# Patient Record
Sex: Female | Born: 1985
Health system: Southern US, Community
[De-identification: ages and names within clinical notes are randomized; demographics above are authoritative.]

## PROBLEM LIST (undated history)

## (undated) DIAGNOSIS — R600 Localized edema: Secondary | ICD-10-CM

## (undated) DIAGNOSIS — N649 Disorder of breast, unspecified: Secondary | ICD-10-CM

## (undated) DIAGNOSIS — E119 Type 2 diabetes mellitus without complications: Secondary | ICD-10-CM

## (undated) DIAGNOSIS — Z86718 Personal history of other venous thrombosis and embolism: Secondary | ICD-10-CM

## (undated) DIAGNOSIS — E669 Obesity, unspecified: Secondary | ICD-10-CM

## (undated) DIAGNOSIS — E282 Polycystic ovarian syndrome: Secondary | ICD-10-CM

## (undated) DIAGNOSIS — J45909 Unspecified asthma, uncomplicated: Secondary | ICD-10-CM

## (undated) DIAGNOSIS — IMO0002 Reserved for concepts with insufficient information to code with codable children: Secondary | ICD-10-CM

## (undated) DIAGNOSIS — N6019 Diffuse cystic mastopathy of unspecified breast: Secondary | ICD-10-CM

## (undated) DIAGNOSIS — Z5189 Encounter for other specified aftercare: Secondary | ICD-10-CM

## (undated) DIAGNOSIS — F909 Attention-deficit hyperactivity disorder, unspecified type: Secondary | ICD-10-CM

## (undated) DIAGNOSIS — F32A Depression, unspecified: Secondary | ICD-10-CM

## (undated) DIAGNOSIS — G4730 Sleep apnea, unspecified: Secondary | ICD-10-CM

## (undated) DIAGNOSIS — D649 Anemia, unspecified: Secondary | ICD-10-CM

## (undated) HISTORY — PX: BREAST SURGERY: SHX581

## (undated) HISTORY — DX: Disorder of breast, unspecified: N64.9

## (undated) HISTORY — DX: Localized edema: R60.0

## (undated) HISTORY — DX: Anemia, unspecified: D64.9

## (undated) HISTORY — DX: Polycystic ovarian syndrome: E28.2

## (undated) HISTORY — PX: BUNIONECTOMY: SHX129

## (undated) HISTORY — DX: Diffuse cystic mastopathy of unspecified breast: N60.19

## (undated) HISTORY — DX: Encounter for other specified aftercare: Z51.89

## (undated) HISTORY — PX: BREAST BIOPSY: SHX20

## (undated) HISTORY — DX: Sleep apnea, unspecified: G47.30

## (undated) HISTORY — PX: COLPOSCOPY: SHX161

## (undated) HISTORY — DX: Unspecified asthma, uncomplicated: J45.909

## (undated) HISTORY — DX: Reserved for concepts with insufficient information to code with codable children: IMO0002

---
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
## 2007-04-24 ENCOUNTER — Telehealth: Payer: Self-pay | Admitting: *Deleted

## 2007-12-11 ENCOUNTER — Emergency Department (HOSPITAL_COMMUNITY): Admission: EM | Admit: 2007-12-11 | Discharge: 2007-12-11 | Payer: Self-pay | Admitting: Emergency Medicine

## 2008-04-08 ENCOUNTER — Ambulatory Visit: Payer: Self-pay | Admitting: Family Medicine

## 2008-04-08 DIAGNOSIS — J029 Acute pharyngitis, unspecified: Secondary | ICD-10-CM

## 2008-04-08 DIAGNOSIS — R1084 Generalized abdominal pain: Secondary | ICD-10-CM | POA: Insufficient documentation

## 2008-04-08 LAB — CONVERTED CEMR LAB: Rapid Strep: NEGATIVE

## 2008-07-09 ENCOUNTER — Telehealth: Payer: Self-pay | Admitting: *Deleted

## 2009-01-06 ENCOUNTER — Emergency Department (HOSPITAL_COMMUNITY): Admission: EM | Admit: 2009-01-06 | Discharge: 2009-01-06 | Payer: Self-pay | Admitting: Emergency Medicine

## 2009-05-24 ENCOUNTER — Emergency Department (HOSPITAL_COMMUNITY): Admission: EM | Admit: 2009-05-24 | Discharge: 2009-05-24 | Payer: Self-pay | Admitting: Emergency Medicine

## 2009-10-13 ENCOUNTER — Emergency Department (HOSPITAL_COMMUNITY): Admission: EM | Admit: 2009-10-13 | Discharge: 2009-10-14 | Payer: Self-pay | Admitting: Emergency Medicine

## 2010-01-04 ENCOUNTER — Emergency Department (HOSPITAL_BASED_OUTPATIENT_CLINIC_OR_DEPARTMENT_OTHER): Admission: EM | Admit: 2010-01-04 | Discharge: 2010-01-04 | Payer: Self-pay | Admitting: Emergency Medicine

## 2010-01-04 ENCOUNTER — Ambulatory Visit: Payer: Self-pay | Admitting: Diagnostic Radiology

## 2010-08-10 ENCOUNTER — Encounter: Payer: Self-pay | Admitting: *Deleted

## 2010-09-06 LAB — URINALYSIS, ROUTINE W REFLEX MICROSCOPIC
Glucose, UA: NEGATIVE mg/dL
Protein, ur: NEGATIVE mg/dL
Specific Gravity, Urine: 1.024 (ref 1.005–1.030)

## 2010-09-06 LAB — POCT PREGNANCY, URINE: Preg Test, Ur: NEGATIVE

## 2010-09-06 LAB — WET PREP, GENITAL: Yeast Wet Prep HPF POC: NONE SEEN

## 2010-09-06 LAB — URINE MICROSCOPIC-ADD ON

## 2010-09-06 LAB — GC/CHLAMYDIA PROBE AMP, GENITAL
Chlamydia, DNA Probe: NEGATIVE
GC Probe Amp, Genital: NEGATIVE

## 2010-09-25 LAB — POCT PREGNANCY, URINE: Preg Test, Ur: NEGATIVE

## 2011-01-12 ENCOUNTER — Emergency Department (HOSPITAL_COMMUNITY)
Admission: EM | Admit: 2011-01-12 | Discharge: 2011-01-12 | Disposition: A | Discharge status: Home / Self Care | Payer: No Typology Code available for payment source | Attending: Emergency Medicine | Admitting: Emergency Medicine

## 2011-01-12 ENCOUNTER — Emergency Department (HOSPITAL_COMMUNITY): Payer: No Typology Code available for payment source

## 2011-01-12 DIAGNOSIS — R1031 Right lower quadrant pain: Secondary | ICD-10-CM | POA: Insufficient documentation

## 2011-01-12 DIAGNOSIS — N390 Urinary tract infection, site not specified: Secondary | ICD-10-CM | POA: Insufficient documentation

## 2011-01-12 LAB — COMPREHENSIVE METABOLIC PANEL
AST: 18 U/L (ref 0–37)
Alkaline Phosphatase: 51 U/L (ref 39–117)
BUN: 7 mg/dL (ref 6–23)
CO2: 27 mEq/L (ref 19–32)
Chloride: 102 mEq/L (ref 96–112)
GFR calc Af Amer: >60 mL/min (ref >60)
GFR calc non Af Amer: >60 mL/min (ref >60)
Sodium: 138 mEq/L (ref 135–145)
Total Bilirubin: 0.4 mg/dL (ref 0.3–1.2)
Total Protein: 8.1 g/dL (ref 6.0–8.3)

## 2011-01-12 LAB — URINALYSIS, ROUTINE W REFLEX MICROSCOPIC
Bilirubin Urine: NEGATIVE
Ketones, ur: NEGATIVE mg/dL
Nitrite: NEGATIVE
Urobilinogen, UA: 0.2 mg/dL (ref 0.0–1.0)
pH: 5.5 (ref 5.0–8.0)

## 2011-01-12 LAB — DIFFERENTIAL
Basophils Relative: 0 % (ref 0–1)
Lymphs Abs: 2.4 10*3/uL (ref 0.7–4.0)
Monocytes Absolute: 0.9 10*3/uL (ref 0.1–1.0)
Neutro Abs: 6.4 10*3/uL (ref 1.7–7.7)
Neutrophils Relative %: 65 % (ref 43–77)

## 2011-01-12 LAB — CBC
HCT: 34.8 % — ABNORMAL LOW (ref 36.0–46.0)
MCH: 31.1 pg (ref 26.0–34.0)
MCHC: 33 g/dL (ref 30.0–36.0)
MCV: 94.1 fL (ref 78.0–100.0)
RDW: 12.7 % (ref 11.5–15.5)

## 2011-01-12 LAB — POCT PREGNANCY, URINE: Preg Test, Ur: NEGATIVE

## 2011-01-12 MED ORDER — IOHEXOL 300 MG/ML  SOLN
100.0000 mL | Freq: Once | INTRAMUSCULAR | Status: AC | PRN
  Administered 2011-01-12: 100 mL via INTRAVENOUS

## 2011-01-12 NOTE — ED Notes (Signed)
°

## 2011-01-12 NOTE — ED Provider Notes (Signed)
°

## 2011-01-12 NOTE — Procedures (Signed)
°

## 2011-01-13 NOTE — Procedures (Signed)
°

## 2011-01-17 NOTE — Miscellaneous (Signed)
°

## 2011-01-17 NOTE — Consent Form (Signed)
°

## 2011-01-17 NOTE — ED Notes (Signed)
°

## 2011-03-16 LAB — URINE MICROSCOPIC-ADD ON

## 2011-03-16 LAB — URINALYSIS, ROUTINE W REFLEX MICROSCOPIC
Bilirubin Urine: NEGATIVE
Glucose, UA: NEGATIVE
Nitrite: NEGATIVE

## 2011-03-16 LAB — POCT PREGNANCY, URINE
Operator id: 27065
Preg Test, Ur: NEGATIVE

## 2011-04-12 ENCOUNTER — Ambulatory Visit: Payer: No Typology Code available for payment source | Admitting: Family Medicine

## 2011-04-17 ENCOUNTER — Ambulatory Visit: Payer: No Typology Code available for payment source

## 2011-06-01 NOTE — ED Provider Notes (Signed)
°

## 2011-06-01 NOTE — ED Notes (Signed)
°

## 2011-07-01 NOTE — ED Notes (Signed)
°

## 2011-07-01 NOTE — ED Provider Notes (Signed)
°

## 2011-07-31 NOTE — ED Provider Notes (Addendum)
°

## 2011-08-10 NOTE — ED Notes (Signed)
°

## 2011-11-01 ENCOUNTER — Emergency Department (HOSPITAL_COMMUNITY)
Admission: EM | Admit: 2011-11-01 | Discharge: 2011-11-01 | Disposition: A | Discharge status: Home / Self Care | Payer: Self-pay | Attending: Emergency Medicine | Admitting: Emergency Medicine

## 2011-11-01 ENCOUNTER — Other Ambulatory Visit: Payer: Self-pay | Admitting: Family Medicine

## 2011-11-01 ENCOUNTER — Encounter (HOSPITAL_COMMUNITY): Payer: Self-pay

## 2011-11-01 DIAGNOSIS — N63 Unspecified lump in unspecified breast: Secondary | ICD-10-CM | POA: Insufficient documentation

## 2011-11-01 DIAGNOSIS — R000 Tachycardia, unspecified: Secondary | ICD-10-CM | POA: Insufficient documentation

## 2011-11-01 DIAGNOSIS — T50905A Adverse effect of unspecified drugs, medicaments and biological substances, initial encounter: Secondary | ICD-10-CM | POA: Insufficient documentation

## 2011-11-01 DIAGNOSIS — N6315 Unspecified lump in the right breast, overlapping quadrants: Secondary | ICD-10-CM

## 2011-11-01 NOTE — ED Notes (Signed)
Patient reports that she feels odd. Feels as if her heart is racing and feels restless. Drank coffee this am and also took phentermine for first time

## 2011-11-01 NOTE — Discharge Instructions (Signed)
As we discussed, you should stop taking the phentermine immediately.  Regarding your breast lump, the breast center sees patients in [place] with these types of problems. You can call them to find out what tests your regular doctor may need to order. They may request a referral from your regular doctor prior to your visit. If you develop fever, chills, redness, or drainage from the area you should be seen again immediately for treatment.      Breast Problems and Self-Exam Completing monthly breast exams may pick up problems early and save lives. There can be numerous causes of swelling, tenderness or lumps in the breasts. Some of these causes are:   Fibrocystic breast syndrome (noncancerous lumps). This is the most common cause of lumps in the breast.   Fibroadenoma breast tumors of unknown cause. These are noncancerous (benign) lumps.   Benign fatty tumors (lipomas).   Cancer of the breast.  By doing monthly breast exams, you get to know how your breasts feel and how they can change from month to month. This allows you to notice changes early. It can also offer you some reassurance that your breast health is good.  BREAST SELF-EXAM There are a few points to follow when doing a thorough breast exam. The best time to examine your breasts is 5 to 7 days after your menstrual period is over. During menstruation, the breasts are lumpier, and it may be more difficult to pick up changes. If you do not menstruate, have reached menopause, or had a hysterectomy (uterus removal), examine your breasts the first day of every month. After 3 to 4 months, you will become more familiar with the variations of your breasts and more comfortable with the exam.  Perform your breast exam monthly. Keep a written record with breast changes or normal findings for each breast. This makes it easier to be sure of changes, so you do not need to depend only on memory for size, tenderness, or location. Try to do the exam  at the same time each month, and write down where you are in your menstrual cycle, if you are still menstruating.   Look at your breasts. Stand in front of a mirror with your hands clasped behind your head. Tighten your chest muscles and look for asymmetry. This means a difference in shape or contour from one breast to the other, such as puckers, dips or bumps. Also, look for skin changes.   Lean forward with your hands on your hips. Again, look for symmetry and skin changes.   While showering, soap the breasts. Then, carefully feel the breasts with your fingertips, while holding the other arm (on the side of the breast you are examining) over your head. Do this with each breast, carefully feeling for lumps or changes. Typically, a circular motion with moderate fingertip pressure should be used.   Repeat this exam while lying on your back. Put your arm behind your head and a pillow under your shoulders. Again, use your fingertips to examine both breasts, feeling for lumps and thickening. Begin at the top of your breast, and go clockwise around the whole breast.   At the end of your exam, gently squeeze each nipple to see if there is any drainage of fluids. Look for nipple changes, dimpling, or redness.   Lastly, examine the upper chest and collarbone (clavicle) areas, and in your armpits.  It is not necessary to be alarmed if you find a breast lump. Most of them are not cancerous.  However, it is necessary to see your caregiver if a lump is found, in order to have it looked at. Document Released: 06/05/2005 Document Revised: 05/25/2011 Document Reviewed: 09/22/2008 [facility] Patient Information 2012 [place], [place].

## 2011-11-01 NOTE — ED Provider Notes (Signed)
History     CSN: [id number]  Arrival date & time 11/01/11  0865   First MD Initiated Contact with Patient 11/01/11 1010      Chief Complaint  Patient presents with  . Tachycardia    (Consider location/radiation/quality/duration/timing/severity/associated sxs/prior treatment) The history is provided by the patient.  Healthy 26 y/o F presents to ED with c/c of tachycardia and restlessness. Symptoms began approx 30 minutes after taking her first dose of a prescription diet pill, phentermine, and drinking a cup of coffee. There is no assoc diaphoresis, CP or SOB. Symptoms have improved since onset. No aggravating factors, rest relieves the symptoms. No prior treatment. No personal hx CAD, HTN, HLD, or DM. No exogenous estrogen use, recent prolonged trip, or hx DVT/PE. No known family hx early CAD.  History reviewed. No pertinent past medical history.  History reviewed. No pertinent past surgical history.  No family history on file.  History  Substance Use Topics  . Smoking status: Former Games developer  . Smokeless tobacco: Not on file  . Alcohol Use:      Review of Systems 10 systems reviewed and are negative for acute change except as noted in the HPI.  Allergies  Review of patient's allergies indicates no known allergies.  Home Medications   Current Outpatient Rx  Name Route Sig Dispense Refill  . PHENTERMINE HCL 37.5 MG PO CAPS Oral Take 37.5 mg by mouth every morning.      BP 124/88  Pulse 86  Temp 98.5 F (36.9 C)  Resp 18  SpO2 100%  Physical Exam  Nursing note reviewed. Constitutional: She is oriented to person, place, and time. She appears well-developed and well-nourished. No distress.       Vital signs are reviewed and are normal. Specifically, there is no tachycardia.  HENT:  Head: Normocephalic and atraumatic.  Eyes: Conjunctivae are normal. Pupils are equal, round, and reactive to light.  Neck: Neck supple.  Cardiovascular: Normal rate, regular rhythm  and normal heart sounds.   No murmur heard.      Bilateral radial and DP pulses are 2+   Pulmonary/Chest: Effort normal and breath sounds normal. No respiratory distress. She has no wheezes. She has no rales. She exhibits no tenderness. Right breast exhibits mass.    Abdominal: Soft. Bowel sounds are normal. Distention: obese. There is no tenderness.  Musculoskeletal: She exhibits no edema and no tenderness.  Neurological: She is alert and oriented to person, place, and time.       Gait normal. Speech clear  Skin: Skin is warm and dry. No rash noted.  Psychiatric:       Anxious-appearing    ED Course  Procedures (including critical care time)  Labs Reviewed - No data to display No results found.   Date: 11/01/2011  Rate: 74  Rhythm: normal sinus rhythm  QRS Axis: normal  Intervals: normal  ST/T Wave abnormalities: normal  Conduction Disutrbances: none  Old EKG Reviewed: No prior available     1. Breast lump on right side at 3 o'clock position   2. Medication reaction       MDM  Resolved tachycardia and restlessness after consuming rx diet pill- these are common side effects for this medication. Pt has a normal EKG and symptoms are improving. No risk factors for ACS, PERC negative. Discussed medication cessation with PMD f/u or re-eval if symptoms return.  Breast mass seen on exam, pt reports it has been gradually increasing in size over the last several  months. No nipple discharge. No skin changes to suggest abscess. Discussed appropriate breast center follow-up, pt voices understanding.        Shaaron Adler, PA-C 11/03/11 1201

## 2011-11-07 NOTE — ED Provider Notes (Signed)
Medical screening examination/treatment/procedure(s) were performed by non-physician practitioner and as supervising physician I was immediately available for consultation/collaboration. Devoria Albe, MD, Armando Gang   Ward Givens, MD 11/07/11 [phone]

## 2011-11-16 ENCOUNTER — Other Ambulatory Visit: Payer: Self-pay | Admitting: Obstetrics and Gynecology

## 2011-11-16 DIAGNOSIS — N63 Unspecified lump in unspecified breast: Secondary | ICD-10-CM

## 2011-11-17 ENCOUNTER — Encounter: Payer: Self-pay | Admitting: *Deleted

## 2011-11-17 ENCOUNTER — Ambulatory Visit (INDEPENDENT_AMBULATORY_CARE_PROVIDER_SITE_OTHER): Payer: Self-pay | Admitting: *Deleted

## 2011-11-17 VITALS — BP 149/105 | HR 79 | Temp 97.8°F | Ht 68.0 in | Wt 303.4 lb

## 2011-11-17 DIAGNOSIS — N6312 Unspecified lump in the right breast, upper inner quadrant: Secondary | ICD-10-CM

## 2011-11-17 DIAGNOSIS — N63 Unspecified lump in unspecified breast: Secondary | ICD-10-CM

## 2011-11-17 DIAGNOSIS — N898 Other specified noninflammatory disorders of vagina: Secondary | ICD-10-CM

## 2011-11-17 DIAGNOSIS — Z01419 Encounter for gynecological examination (general) (routine) without abnormal findings: Secondary | ICD-10-CM

## 2011-11-17 DIAGNOSIS — N6311 Unspecified lump in the right breast, upper outer quadrant: Secondary | ICD-10-CM

## 2011-11-17 NOTE — Patient Instructions (Signed)
Taught patient how to perform BSE.  Let her know her next Pap smear will be due based on today's result since patient had an abnormal Pap smear in 2009. Patient referred to the [facility] for right breast ultrasound. Appointment scheduled for Wednesday, November 22, 2011 at 1110. Patient aware of appointment and will be there. Let patient know will follow up with her within the next couple weeks with results. Patient verbalized understanding.

## 2011-11-17 NOTE — Progress Notes (Signed)
Complaints of right breast lump since September 2012 that has increased in size. Patient states pain at lump that is constant rating at a 10 out of 10 when awakes in the morning and at a 6 out of 10 during the remainder of the day. Patient states the pain has increased over the past month.  Pap Smear:    Completed Pap smear today. Last Pap smear was at least 3 years ago per patient. Per patient had an abnormal Pap smear in 2009 that required a colposcopy for follow up. No Pap smear results in EPIC.  Physical exam: Breasts Breasts symmetrical. No skin abnormalities bilateral breasts. No nipple retraction bilateral breasts. No nipple discharge bilateral breasts. No lymphadenopathy. No lumps palpated left breast. Palpated two lumps in the right breast at 11 o'clock on the areola and at 1 o'clock 2 cm from the areola. Complaints of pain on palpation of lumps. Patient referred to the [facility] for right breast ultrasound. Appointment scheduled for Wednesday, November 22, 2011 at 1110.       Pelvic/Bimanual   Ext Genitalia No lesions, no swelling and no discharge observed on external genitalia.         Vagina Vagina pink and normal texture. No lesions and white creamy discharge observed in vagina. Completed wet prep.          Cervix Cervix is present. Cervix pink and of normal texture. White creamy discharge observed on cervix.         Uterus Uterus is present and palpable. Uterus in normal position and normal size.       Adnexae Bilateral ovaries present and unable to palpate due to patient being morbid obese. No tenderness on palpation.        Rectovaginal No rectal exam completed today since patient had no rectal complaints. No skin abnormalties observed on rectal area.

## 2011-11-20 LAB — WET PREP, GENITAL: Trich, Wet Prep: NONE SEEN

## 2011-11-22 ENCOUNTER — Ambulatory Visit
Admission: RE | Admit: 2011-11-22 | Discharge: 2011-11-22 | Disposition: A | Discharge status: Home / Self Care | Payer: No Typology Code available for payment source | Source: Ambulatory Visit | Attending: Obstetrics and Gynecology | Admitting: Obstetrics and Gynecology

## 2011-11-22 ENCOUNTER — Other Ambulatory Visit: Payer: Self-pay | Admitting: Obstetrics and Gynecology

## 2011-11-22 DIAGNOSIS — N63 Unspecified lump in unspecified breast: Secondary | ICD-10-CM

## 2011-11-23 MED ORDER — METRONIDAZOLE 500 MG PO TABS: 500.0000 mg | ORAL_TABLET | Freq: Two times a day (BID) | ORAL | Status: AC

## 2011-11-23 NOTE — Progress Notes (Signed)
Addended by: Catalina Antigua on: 11/23/2011 02:05 PM   Modules accepted: Orders

## 2011-11-27 ENCOUNTER — Ambulatory Visit: Payer: No Typology Code available for payment source | Admitting: Family Medicine

## 2011-11-28 ENCOUNTER — Telehealth: Payer: Self-pay | Admitting: *Deleted

## 2011-11-28 NOTE — Telephone Encounter (Signed)
Telephoned patient and advised of positive results for trichomonas. Patient to pick Rx up at pharmacy and advised partner would also need to be treated. Patient voiced understanding.

## 2011-12-05 ENCOUNTER — Ambulatory Visit
Admission: RE | Admit: 2011-12-05 | Discharge: 2011-12-05 | Disposition: A | Discharge status: Home / Self Care | Payer: No Typology Code available for payment source | Source: Ambulatory Visit | Attending: Obstetrics and Gynecology | Admitting: Obstetrics and Gynecology

## 2011-12-05 DIAGNOSIS — N63 Unspecified lump in unspecified breast: Secondary | ICD-10-CM

## 2011-12-12 ENCOUNTER — Ambulatory Visit (INDEPENDENT_AMBULATORY_CARE_PROVIDER_SITE_OTHER): Payer: Self-pay | Admitting: Family Medicine

## 2011-12-12 ENCOUNTER — Encounter: Payer: Self-pay | Admitting: Family Medicine

## 2011-12-12 VITALS — BP 119/77 | HR 69 | Temp 98.2°F | Ht 68.0 in | Wt 304.0 lb

## 2011-12-12 DIAGNOSIS — N6019 Diffuse cystic mastopathy of unspecified breast: Secondary | ICD-10-CM

## 2011-12-12 DIAGNOSIS — Z9189 Other specified personal risk factors, not elsewhere classified: Secondary | ICD-10-CM

## 2011-12-12 DIAGNOSIS — Z202 Contact with and (suspected) exposure to infections with a predominantly sexual mode of transmission: Secondary | ICD-10-CM | POA: Insufficient documentation

## 2011-12-12 DIAGNOSIS — E669 Obesity, unspecified: Secondary | ICD-10-CM

## 2011-12-12 LAB — COMPREHENSIVE METABOLIC PANEL
AST: 16 U/L (ref 0–37)
Albumin: 3.9 g/dL (ref 3.5–5.2)
Alkaline Phosphatase: 42 U/L (ref 39–117)
Chloride: 108 mEq/L (ref 96–112)
Glucose, Bld: 93 mg/dL (ref 70–99)
Potassium: 4.3 mEq/L (ref 3.5–5.3)
Sodium: 140 mEq/L (ref 135–145)
Total Protein: 6.7 g/dL (ref 6.0–8.3)
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
## 2011-12-12 NOTE — Assessment & Plan Note (Signed)
A: declined. With weight gain. Poor diet.  P: Check lipids and blood sugar and TSH.  referral to nutritionist requested and provided.

## 2011-12-12 NOTE — Assessment & Plan Note (Signed)
A: large breast with cystic lesions.  P: recommend patient call to scheduled recommended biopsy instead of waiting for f/u U/S in 3 months. Patient voiced understanding and agreed with plan.

## 2011-12-12 NOTE — Patient Instructions (Addendum)
Chameka,  It was nice to meet your. For weight loss counseling please call Wyona Almas at [phone]. She sets her own schedule.  I will call or send a letter (if all normal) with your blood work results.   Dr. Armen Pickup

## 2011-12-13 ENCOUNTER — Telehealth: Payer: Self-pay | Admitting: Family Medicine

## 2011-12-13 NOTE — Telephone Encounter (Signed)
Called patient informed her that all labs are normal.

## 2011-12-14 ENCOUNTER — Other Ambulatory Visit: Payer: Self-pay | Admitting: Obstetrics and Gynecology

## 2011-12-14 DIAGNOSIS — N63 Unspecified lump in unspecified breast: Secondary | ICD-10-CM

## 2011-12-25 ENCOUNTER — Inpatient Hospital Stay: Admission: RE | Admit: 2011-12-25 | Payer: Self-pay | Source: Ambulatory Visit

## 2011-12-28 ENCOUNTER — Ambulatory Visit: Payer: Self-pay | Admitting: Family Medicine

## 2012-01-03 ENCOUNTER — Telehealth: Payer: Self-pay | Admitting: Family Medicine

## 2012-01-03 NOTE — Telephone Encounter (Signed)
Called patient. She missed nutrition visit because she thought it was this week. She would like to reschedule. Schedule 11:30 AM 01/11/12.   Called patient back, no 11:30 available. I put her in at noon. Left VM for return call.

## 2012-01-08 ENCOUNTER — Ambulatory Visit
Admission: RE | Admit: 2012-01-08 | Discharge: 2012-01-08 | Disposition: A | Discharge status: Home / Self Care | Payer: No Typology Code available for payment source | Source: Ambulatory Visit | Attending: Obstetrics and Gynecology | Admitting: Obstetrics and Gynecology

## 2012-01-08 ENCOUNTER — Other Ambulatory Visit: Payer: Self-pay | Admitting: Obstetrics and Gynecology

## 2012-01-08 DIAGNOSIS — N63 Unspecified lump in unspecified breast: Secondary | ICD-10-CM

## 2012-01-09 NOTE — Procedures (Signed)
°

## 2012-01-11 ENCOUNTER — Ambulatory Visit: Payer: Self-pay | Admitting: Family Medicine

## 2012-03-07 ENCOUNTER — Telehealth (HOSPITAL_COMMUNITY): Payer: Self-pay | Admitting: *Deleted

## 2012-03-07 NOTE — Telephone Encounter (Signed)
Telephoned home # and # does not accept incoming calls.

## 2012-06-19 HISTORY — PX: OTHER SURGICAL HISTORY: SHX169

## 2012-09-17 ENCOUNTER — Emergency Department (HOSPITAL_COMMUNITY)
Admission: EM | Admit: 2012-09-17 | Discharge: 2012-09-17 | Disposition: A | Discharge status: Home / Self Care | Payer: Self-pay | Attending: Emergency Medicine | Admitting: Emergency Medicine

## 2012-09-17 ENCOUNTER — Encounter (HOSPITAL_COMMUNITY): Payer: Self-pay | Admitting: Emergency Medicine

## 2012-09-17 DIAGNOSIS — R220 Localized swelling, mass and lump, head: Secondary | ICD-10-CM | POA: Insufficient documentation

## 2012-09-17 DIAGNOSIS — Z87891 Personal history of nicotine dependence: Secondary | ICD-10-CM | POA: Insufficient documentation

## 2012-09-17 DIAGNOSIS — K006 Disturbances in tooth eruption: Secondary | ICD-10-CM | POA: Insufficient documentation

## 2012-09-17 DIAGNOSIS — K029 Dental caries, unspecified: Secondary | ICD-10-CM | POA: Insufficient documentation

## 2012-09-17 DIAGNOSIS — Z8742 Personal history of other diseases of the female genital tract: Secondary | ICD-10-CM | POA: Insufficient documentation

## 2012-09-17 DIAGNOSIS — K0889 Other specified disorders of teeth and supporting structures: Secondary | ICD-10-CM

## 2012-09-17 DIAGNOSIS — K089 Disorder of teeth and supporting structures, unspecified: Secondary | ICD-10-CM | POA: Insufficient documentation

## 2012-09-17 DIAGNOSIS — R221 Localized swelling, mass and lump, neck: Secondary | ICD-10-CM | POA: Insufficient documentation

## 2012-09-17 MED ORDER — HYDROCODONE-ACETAMINOPHEN 5-325 MG PO TABS: 1.0000 | ORAL_TABLET | ORAL | Status: DC | PRN

## 2012-09-17 MED ORDER — PENICILLIN V POTASSIUM 500 MG PO TABS: 500.0000 mg | ORAL_TABLET | Freq: Four times a day (QID) | ORAL | Status: AC

## 2012-09-17 NOTE — ED Provider Notes (Signed)
History     CSN: [id number]  Arrival date & time 09/17/12  1420   First MD Initiated Contact with Patient 09/17/12 1439      Chief Complaint  Patient presents with   Dental Pain    (Consider location/radiation/quality/duration/timing/severity/associated sxs/prior treatment) HPI Comments: Pt presenting to the ED for dental pain x 2 days.  Pain localized over left lower molars and right upper molars.  Notes that she feels a small amount of swelling over her right upper jaw.  Prior dental abscess requiring surgical excision of tooth.  Does not currently see a dentist.  Pain is exacerbated by chewing.  Tried OTC medications without relief of sx.  Denies fever, chills, trismus.  No difficulty swallowing or breathing.  Patient is a 27 y.o. female presenting with tooth pain. The history is provided by the patient.  Dental Pain   Past Medical History  Diagnosis Date   Abnormal Pap smear     Last pap smear normal    Breast disorder     lump in right breast   Edema of left lower extremity     Since age 44     Past Surgical History  Procedure Laterality Date   Colposcopy  2009/2010    Family History  Problem Relation Age of Onset   Hypertension Mother    Asthma Mother    Asthma Brother    Cancer Maternal Uncle     lung   Cancer Maternal Grandmother     brain   Cancer Maternal Grandfather     pancreatic    History  Substance Use Topics   Smoking status: Former Smoker    Quit date: 12/12/2010   Smokeless tobacco: Never Used   Alcohol Use: Yes     Comment: socially    OB History   Grav Para Term Preterm Abortions TAB SAB Ect Mult Living   1    1  1    0      Review of Systems  HENT: Positive for dental problem.   All other systems reviewed and are negative.    Allergies  Review of patient's allergies indicates no known allergies.  Home Medications   Current Outpatient Rx  Name  Route  Sig  Dispense  Refill   acetaminophen (TYLENOL) 500 MG  tablet   Oral   Take 4,000 mg by mouth every 6 (six) hours as needed for pain.          ibuprofen (ADVIL,MOTRIN) 200 MG tablet   Oral   Take 800 mg by mouth every 6 (six) hours as needed for pain.           BP 110/64   Pulse 68   Temp(Src) 98.3 F (36.8 C) (Oral)   Resp 22   SpO2 98%   LMP 09/12/2012  Physical Exam  Nursing note and vitals reviewed. Constitutional: She is oriented to person, place, and time. She appears well-developed and well-nourished.  HENT:  Head: Normocephalic and atraumatic. No trismus in the jaw.  Mouth/Throat: Uvula is midline, oropharynx is clear and moist and mucous membranes are normal. No oral lesions. Abnormal dentition. Dental caries present. No edematous. No oropharyngeal exudate, posterior oropharyngeal edema, posterior oropharyngeal erythema or tonsillar abscesses.    Eyes: Conjunctivae and EOM are normal. Pupils are equal, round, and reactive to light.  Neck: Normal range of motion.  Cardiovascular: Normal rate, regular rhythm and normal heart sounds.   Pulmonary/Chest: Effort normal and breath sounds normal. She has no wheezes.  Musculoskeletal: Normal range of motion.  Neurological: She is alert and oriented to person, place, and time.  Skin: Skin is warm and dry.  Psychiatric: She has a normal mood and affect.    ED Course  Procedures (including critical care time)  Labs Reviewed - No data to display No results found.   1. Pain, dental       MDM   Pt with dental pain x 2 days.  Teeth largely in poor dentition with possible dental abscess surrounding R upper molar.  Rx pen VK and vicodin.  FU with dentist- Dr. Leanord Asal in the next 48 hours.  Return precautions advised.       Garlon Hatchet, PA-C 09/18/12 1714

## 2012-09-17 NOTE — ED Notes (Addendum)
Pt c/o 10/10 dental pain in the right upper and left lower dental quadrants. Pt reports that her gums on the right side are sensitive and feels like their is a bump.

## 2012-09-18 NOTE — ED Provider Notes (Signed)
Medical screening examination/treatment/procedure(s) were performed by non-physician practitioner and as supervising physician I was immediately available for consultation/collaboration.  Toy Baker, MD 09/18/12 [phone]

## 2012-10-07 ENCOUNTER — Encounter: Payer: Self-pay | Admitting: *Deleted

## 2013-01-21 ENCOUNTER — Encounter (HOSPITAL_COMMUNITY): Payer: Self-pay | Admitting: *Deleted

## 2013-01-21 ENCOUNTER — Emergency Department (HOSPITAL_COMMUNITY)
Admission: EM | Admit: 2013-01-21 | Discharge: 2013-01-21 | Disposition: A | Discharge status: Home / Self Care | Payer: Medicaid - Out of State | Attending: Emergency Medicine | Admitting: Emergency Medicine

## 2013-01-21 ENCOUNTER — Emergency Department (HOSPITAL_COMMUNITY): Payer: Medicaid - Out of State

## 2013-01-21 DIAGNOSIS — Z8742 Personal history of other diseases of the female genital tract: Secondary | ICD-10-CM | POA: Insufficient documentation

## 2013-01-21 DIAGNOSIS — Z79899 Other long term (current) drug therapy: Secondary | ICD-10-CM | POA: Insufficient documentation

## 2013-01-21 DIAGNOSIS — Z3202 Encounter for pregnancy test, result negative: Secondary | ICD-10-CM | POA: Insufficient documentation

## 2013-01-21 DIAGNOSIS — Z87891 Personal history of nicotine dependence: Secondary | ICD-10-CM | POA: Insufficient documentation

## 2013-01-21 DIAGNOSIS — R079 Chest pain, unspecified: Secondary | ICD-10-CM

## 2013-01-21 DIAGNOSIS — R05 Cough: Secondary | ICD-10-CM | POA: Insufficient documentation

## 2013-01-21 DIAGNOSIS — R0602 Shortness of breath: Secondary | ICD-10-CM | POA: Insufficient documentation

## 2013-01-21 DIAGNOSIS — R059 Cough, unspecified: Secondary | ICD-10-CM | POA: Insufficient documentation

## 2013-01-21 LAB — POCT PREGNANCY, URINE: Preg Test, Ur: NEGATIVE

## 2013-01-21 MED ORDER — IBUPROFEN 400 MG PO TABS: 400.0000 mg | ORAL_TABLET | Freq: Four times a day (QID) | ORAL | Status: DC

## 2013-01-21 MED ORDER — KETOROLAC TROMETHAMINE 30 MG/ML IJ SOLN
30.0000 mg | Freq: Once | INTRAMUSCULAR | Status: AC
  Administered 2013-01-21: 30 mg via INTRAVENOUS
  Filled 2013-01-21: qty 1

## 2013-01-21 MED ORDER — HYDROCODONE-ACETAMINOPHEN 5-325 MG PO TABS: 1.0000 | ORAL_TABLET | Freq: Four times a day (QID) | ORAL | Status: DC | PRN

## 2013-01-21 MED ORDER — OXYCODONE-ACETAMINOPHEN 5-325 MG PO TABS
1.0000 | ORAL_TABLET | Freq: Once | ORAL | Status: AC
  Administered 2013-01-21: 1 via ORAL
  Filled 2013-01-21 (×2): qty 1

## 2013-01-21 NOTE — ED Provider Notes (Signed)
CSN: [id number]     Arrival date & time 01/21/13  0848 History     First MD Initiated Contact with Patient 01/21/13 [phone]     Chief Complaint  Patient presents with   Chest Pain   Shortness of Breath   (Consider location/radiation/quality/duration/timing/severity/associated sxs/prior Treatment) Patient is a 27 y.o. female presenting with chest pain and shortness of breath.  Chest Pain Pain location:  L chest Pain quality: sharp   Pain radiates to:  Does not radiate Pain severity:  Moderate Onset quality:  Gradual Duration:  4 days Timing:  Constant Progression:  Worsening Chronicity:  New Context: breathing, movement and raising an arm   Relieved by:  Nothing Worsened by:  Deep breathing and certain positions Ineffective treatments:  None tried Associated symptoms: cough and shortness of breath   Associated symptoms: no abdominal pain, no fever, no nausea and not vomiting   Shortness of Breath Associated symptoms: chest pain and cough   Associated symptoms: no abdominal pain, no fever and no vomiting     Past Medical History  Diagnosis Date   Abnormal Pap smear     Last pap smear normal    Breast disorder     lump in right breast   Edema of left lower extremity     Since age 89    Past Surgical History  Procedure Laterality Date   Colposcopy  2009/2010   Family History  Problem Relation Age of Onset   Hypertension Mother    Asthma Mother    Asthma Brother    Cancer Maternal Uncle     lung   Cancer Maternal Grandmother     brain   Cancer Maternal Grandfather     pancreatic   History  Substance Use Topics   Smoking status: Former Smoker    Quit date: 12/12/2010   Smokeless tobacco: Never Used   Alcohol Use: Yes     Comment: socially   OB History   Grav Para Term Preterm Abortions TAB SAB Ect Mult Living   1    1  1    0     Review of Systems  Constitutional: Negative for fever.  HENT: Negative for congestion.   Respiratory: Positive  for cough and shortness of breath.   Cardiovascular: Positive for chest pain.  Gastrointestinal: Negative for nausea, vomiting, abdominal pain and diarrhea.  All other systems reviewed and are negative.    Allergies  Review of patient's allergies indicates no known allergies.  Home Medications   Current Outpatient Rx  Name  Route  Sig  Dispense  Refill   ibuprofen (ADVIL,MOTRIN) 200 MG tablet   Oral   Take 400-800 mg by mouth every 6 (six) hours as needed for pain.           penicillin v potassium (VEETID) 250 MG tablet   Oral   Take 250 mg by mouth 4 (four) times daily.          BP 123/75   Pulse 78   Temp(Src) 98.3 F (36.8 C)   Resp 18   SpO2 96%   LMP 01/06/2013 Physical Exam  Nursing note and vitals reviewed. Constitutional: She is oriented to person, place, and time. She appears well-developed and well-nourished. No distress.  HENT:  Head: Normocephalic and atraumatic.  Mouth/Throat: Oropharynx is clear and moist.  Eyes: Conjunctivae are normal. Pupils are equal, round, and reactive to light. No scleral icterus.  Neck: Neck supple.  Cardiovascular: Normal rate, regular rhythm, normal heart  sounds and intact distal pulses.   No murmur heard. Pulmonary/Chest: Effort normal and breath sounds normal. No stridor. No respiratory distress. She has no rales. She exhibits tenderness (point tender in left upper chest.  pain with sitting up and twisting.).  Abdominal: Soft. Bowel sounds are normal. She exhibits no distension. There is no tenderness. There is no rebound and no guarding.  Musculoskeletal: Normal range of motion. She exhibits no edema and no tenderness.  Neurological: She is alert and oriented to person, place, and time.  Skin: Skin is warm and dry. No rash noted.  Psychiatric: She has a normal mood and affect. Her behavior is normal.    ED Course   Procedures (including critical care time)  Labs Reviewed  POCT PREGNANCY, URINE   Dg Chest 2  View  01/21/2013   *RADIOLOGY REPORT*  Clinical Data:  Chest pain  CHEST - 2 VIEW  Comparison:  January 04, 2010  Findings: Lungs clear.  Heart size and pulmonary vascularity are normal.  No adenopathy.  No pneumothorax.  No bone lesions.  IMPRESSION: No abnormality noted.   Original Report Authenticated By: Bretta Bang, M.D.  All radiology studies independently viewed by me.     EKG - NSR, rate 83, normal axis, normal intervals, no ST/T changes, similar to prior.   1. Chest pain     MDM  27 yo female with 4 days of chest pain.  History and PE point strongly towards msk pain.  Low suspicion for PE and she is PERC negative.  Plan xray, toradol, UPT.  Story and risk profile not c/w ACS.  No evidence of dissection.    Plain film negative.  Given percocet in addition to her toradol for pain control.  Given return precautions and advised follow up.    Candyce Churn, MD 01/22/13 [phone]

## 2013-01-21 NOTE — ED Notes (Signed)
Patient was educated not to drive, operate heavy machinery, or drink alcohol while taking narcotic medication.

## 2013-01-21 NOTE — ED Notes (Signed)
Patient transported to X-ray.

## 2013-01-21 NOTE — ED Notes (Signed)
[id number]<AV> Expected date:<BR> Expected time:<BR> Means of arrival:<BR> Comments:<BR> EMS-sz/off psyche meds

## 2013-01-21 NOTE — ED Notes (Signed)
Pt states since Friday has had sharp L sided chest pains, states hurts worse when taking a deep breath in, pt states also has been short of breath and wheezing, pt denies hx of asthma, pt denies n/v, denies weakness/lightheadedness

## 2013-01-22 NOTE — Procedures (Signed)
°

## 2013-03-12 NOTE — Procedures (Signed)
°

## 2013-03-17 ENCOUNTER — Other Ambulatory Visit: Payer: Self-pay | Admitting: Family Medicine

## 2014-04-20 ENCOUNTER — Encounter (HOSPITAL_COMMUNITY): Payer: Self-pay | Admitting: *Deleted

## 2014-09-04 ENCOUNTER — Encounter (HOSPITAL_COMMUNITY): Payer: Self-pay | Admitting: Emergency Medicine

## 2014-09-04 ENCOUNTER — Emergency Department (HOSPITAL_COMMUNITY)
Admission: EM | Admit: 2014-09-04 | Discharge: 2014-09-05 | Disposition: A | Discharge status: Home / Self Care | Payer: Medicaid - Out of State | Attending: Emergency Medicine | Admitting: Emergency Medicine

## 2014-09-04 DIAGNOSIS — Z791 Long term (current) use of non-steroidal anti-inflammatories (NSAID): Secondary | ICD-10-CM | POA: Insufficient documentation

## 2014-09-04 DIAGNOSIS — I82401 Acute embolism and thrombosis of unspecified deep veins of right lower extremity: Secondary | ICD-10-CM | POA: Insufficient documentation

## 2014-09-04 DIAGNOSIS — Z792 Long term (current) use of antibiotics: Secondary | ICD-10-CM | POA: Insufficient documentation

## 2014-09-04 DIAGNOSIS — Z7901 Long term (current) use of anticoagulants: Secondary | ICD-10-CM | POA: Insufficient documentation

## 2014-09-04 DIAGNOSIS — Z8742 Personal history of other diseases of the female genital tract: Secondary | ICD-10-CM | POA: Insufficient documentation

## 2014-09-04 DIAGNOSIS — Z87891 Personal history of nicotine dependence: Secondary | ICD-10-CM | POA: Insufficient documentation

## 2014-09-04 DIAGNOSIS — I82402 Acute embolism and thrombosis of unspecified deep veins of left lower extremity: Secondary | ICD-10-CM | POA: Insufficient documentation

## 2014-09-04 DIAGNOSIS — I82403 Acute embolism and thrombosis of unspecified deep veins of lower extremity, bilateral: Secondary | ICD-10-CM

## 2014-09-04 DIAGNOSIS — R609 Edema, unspecified: Secondary | ICD-10-CM

## 2014-09-04 HISTORY — DX: Personal history of other venous thrombosis and embolism: Z86.718

## 2014-09-04 NOTE — ED Notes (Signed)
Patient comes in with the c/o left leg and foot pain.  She states that she went out of town last Friday and returned on Sunday.  It was a 5 hour car ride where they only made 3 stops along the way.  She reports that her feet have been swollen for a couple of days and she has been elevating them to help decrease the swelling.  The patient worked a 4 hour shift today and her feet are swollen bilaterally with no pitting edema present at this time.  Patient states the pain is a "shooting and stinging pain".  She also described it as a burning pain when she applies pressure on it.  Patient complains of shortness of breath as of today starting around 1900.  The patient currently takes coumadin for a previously diagnosed right leg blood clot.

## 2014-09-05 ENCOUNTER — Encounter (HOSPITAL_COMMUNITY): Payer: Self-pay | Admitting: *Deleted

## 2014-09-05 ENCOUNTER — Emergency Department (HOSPITAL_COMMUNITY): Payer: Medicaid - Out of State

## 2014-09-05 ENCOUNTER — Emergency Department (HOSPITAL_COMMUNITY)
Admission: EM | Admit: 2014-09-05 | Discharge: 2014-09-05 | Disposition: A | Discharge status: Home / Self Care | Payer: Medicaid - Out of State | Attending: Emergency Medicine | Admitting: Emergency Medicine

## 2014-09-05 DIAGNOSIS — Z7901 Long term (current) use of anticoagulants: Secondary | ICD-10-CM | POA: Insufficient documentation

## 2014-09-05 DIAGNOSIS — R0602 Shortness of breath: Secondary | ICD-10-CM | POA: Insufficient documentation

## 2014-09-05 DIAGNOSIS — Z86718 Personal history of other venous thrombosis and embolism: Secondary | ICD-10-CM | POA: Diagnosis not present

## 2014-09-05 DIAGNOSIS — R2242 Localized swelling, mass and lump, left lower limb: Secondary | ICD-10-CM | POA: Diagnosis not present

## 2014-09-05 DIAGNOSIS — Z87891 Personal history of nicotine dependence: Secondary | ICD-10-CM | POA: Insufficient documentation

## 2014-09-05 DIAGNOSIS — Z792 Long term (current) use of antibiotics: Secondary | ICD-10-CM | POA: Diagnosis not present

## 2014-09-05 DIAGNOSIS — E669 Obesity, unspecified: Secondary | ICD-10-CM | POA: Diagnosis not present

## 2014-09-05 DIAGNOSIS — Z3202 Encounter for pregnancy test, result negative: Secondary | ICD-10-CM | POA: Insufficient documentation

## 2014-09-05 DIAGNOSIS — R609 Edema, unspecified: Secondary | ICD-10-CM | POA: Diagnosis not present

## 2014-09-05 DIAGNOSIS — Z79899 Other long term (current) drug therapy: Secondary | ICD-10-CM | POA: Diagnosis not present

## 2014-09-05 DIAGNOSIS — M79662 Pain in left lower leg: Secondary | ICD-10-CM | POA: Diagnosis present

## 2014-09-05 DIAGNOSIS — M7989 Other specified soft tissue disorders: Secondary | ICD-10-CM

## 2014-09-05 LAB — BASIC METABOLIC PANEL
Anion gap: 5 (ref 5–15)
BUN: 9 mg/dL (ref 6–23)
CO2: 26 mmol/L (ref 19–32)
Calcium: 9 mg/dL (ref 8.4–10.5)
Chloride: 107 mmol/L (ref 96–112)
Creatinine, Ser: 0.77 mg/dL (ref 0.50–1.10)
GFR calc Af Amer: >90 mL/min (ref >90)
GFR calc non Af Amer: >90 mL/min (ref >90)
Glucose, Bld: 92 mg/dL (ref 70–99)
POTASSIUM: 3.6 mmol/L (ref 3.5–5.1)
SODIUM: 138 mmol/L (ref 135–145)

## 2014-09-05 LAB — URINE MICROSCOPIC-ADD ON

## 2014-09-05 LAB — CBC
HEMATOCRIT: 36.3 % (ref 36.0–46.0)
Hemoglobin: 11.8 g/dL — ABNORMAL LOW (ref 12.0–15.0)
MCH: 30.5 pg (ref 26.0–34.0)
MCHC: 32.5 g/dL (ref 30.0–36.0)
MCV: 93.8 fL (ref 78.0–100.0)
Platelets: 336 10*3/uL (ref 150–400)
RBC: 3.87 MIL/uL (ref 3.87–5.11)
RDW: 13.3 % (ref 11.5–15.5)
WBC: 8.5 10*3/uL (ref 4.0–10.5)

## 2014-09-05 LAB — POC URINE PREG, ED: Preg Test, Ur: NEGATIVE

## 2014-09-05 LAB — URINALYSIS, ROUTINE W REFLEX MICROSCOPIC
BILIRUBIN URINE: NEGATIVE
GLUCOSE, UA: NEGATIVE mg/dL
Hgb urine dipstick: NEGATIVE
Ketones, ur: NEGATIVE mg/dL
Nitrite: NEGATIVE
Protein, ur: NEGATIVE mg/dL
Specific Gravity, Urine: 1.026 (ref 1.005–1.030)
Urobilinogen, UA: 1 mg/dL (ref 0.0–1.0)
pH: 7.5 (ref 5.0–8.0)

## 2014-09-05 LAB — BRAIN NATRIURETIC PEPTIDE: B Natriuretic Peptide: 32.9 pg/mL (ref 0.0–100.0)

## 2014-09-05 LAB — I-STAT TROPONIN, ED: Troponin i, poc: 0 ng/mL (ref 0.00–0.08)

## 2014-09-05 MED ORDER — OXYCODONE-ACETAMINOPHEN 5-325 MG PO TABS
1.0000 | ORAL_TABLET | Freq: Once | ORAL | Status: AC
  Administered 2014-09-05: 1 via ORAL
  Filled 2014-09-05: qty 1

## 2014-09-05 MED ORDER — FUROSEMIDE 20 MG PO TABS: 20.0000 mg | ORAL_TABLET | Freq: Every day | ORAL | Status: DC

## 2014-09-05 MED ORDER — OXYCODONE-ACETAMINOPHEN 5-325 MG PO TABS: 1.0000 | ORAL_TABLET | ORAL | Status: DC | PRN

## 2014-09-05 MED ORDER — HYDROMORPHONE HCL 1 MG/ML IJ SOLN
1.0000 mg | Freq: Once | INTRAMUSCULAR | Status: AC
Start: 2014-09-05 — End: 2014-09-05
  Administered 2014-09-05: 1 mg via INTRAMUSCULAR
  Filled 2014-09-05: qty 1

## 2014-09-05 MED ORDER — HYDROMORPHONE HCL 1 MG/ML IJ SOLN: 1.0000 mg | Freq: Once | INTRAMUSCULAR | Status: DC

## 2014-09-05 MED ORDER — IBUPROFEN 400 MG PO TABS: 800.0000 mg | ORAL_TABLET | Freq: Three times a day (TID) | ORAL | Status: AC

## 2014-09-05 NOTE — Progress Notes (Signed)
VASCULAR LAB PRELIMINARY  PRELIMINARY  PRELIMINARY  PRELIMINARY  Bilateral lower extremity venous Dopplers completed.    Preliminary report:  There is no obvious evidence of DVT or SVT noted in the bilateral lower extremities.   Jalon Squier, RVT 09/05/2014, 4:47 PM

## 2014-09-05 NOTE — ED Provider Notes (Signed)
CSN: [id number]     Arrival date & time 09/05/14  1245 History   First MD Initiated Contact with Patient 09/05/14 1336     Chief Complaint  Patient presents with   Leg Pain   Shortness of Breath     (Consider location/radiation/quality/duration/timing/severity/associated sxs/prior Treatment) HPI Patient presents about 14 hours after recently being evaluated at this facility. Patient has a very notable recent history of diagnosis of DVT in the right leg, 5 days ago. At that point the patient was started on Coumadin, which she states she is taking regularly. Over the past 2 days the patient has developed new left lower extremity pain, swelling. Throughout the illness she has had some chest pain, some dyspnea, unchanged over the past days. This morning she was evaluated for her left lower extremity pain, was discharged, did not fill her narcotic prescription, and now returns with ongoing pain in the left lower extremity, the aforementioned ongoing chest pain, but no new dyspnea, new chest pain, new syncope, new nausea, no vomiting. She also denies any new loss of sensation in the left distal lower extremity.  Patient's DVT is presumably from recent prolonged car travel, as the patient denies other apical problems. Patient does not smoke.  Past Medical History  Diagnosis Date   Abnormal Pap smear     Last pap smear normal    Breast disorder     lump in right breast   Edema of left lower extremity     Since age 55    History of blood clots     right leg   Past Surgical History  Procedure Laterality Date   Colposcopy  2009/2010   Bunionectomy      right foot   Family History  Problem Relation Age of Onset   Hypertension Mother    Asthma Mother    Asthma Brother    Cancer Maternal Uncle     lung   Cancer Maternal Grandmother     brain   Cancer Maternal Grandfather     pancreatic   History  Substance Use Topics   Smoking status: Former Smoker    Quit  date: 12/12/2010   Smokeless tobacco: Never Used   Alcohol Use: Yes     Comment: socially   OB History    Gravida Para Term Preterm AB TAB SAB Ectopic Multiple Living   0     Review of Systems  Constitutional:       Per HPI, otherwise negative  HENT:       Per HPI, otherwise negative  Respiratory:       Per HPI, otherwise negative  Cardiovascular:       Per HPI, otherwise negative  Gastrointestinal: Negative for vomiting.  Endocrine:       Negative aside from HPI  Genitourinary:       Neg aside from HPI   Musculoskeletal:       Per HPI, otherwise negative  Skin: Negative.   Neurological: Negative for syncope.      Allergies  Review of patient's allergies indicates no known allergies.  Home Medications   Prior to Admission medications   Medication Sig Start Date End Date Taking? Authorizing Provider  furosemide (LASIX) 20 MG tablet Take 1 tablet (20 mg total) by mouth daily. 09/05/14   Dione Booze, MD  ibuprofen (ADVIL,MOTRIN) 200 MG tablet Take 400-800 mg by mouth every 6 (six) hours as needed for pain.  Historical Provider, MD  ibuprofen (ADVIL,MOTRIN) 400 MG tablet Take 1 tablet (400 mg total) by mouth every 6 (six) hours. Patient not taking: Reported on 09/05/2014 01/21/13   Blake DivineJohn Wofford, MD  oxyCODONE-acetaminophen (PERCOCET) 5-325 MG per tablet Take 1 tablet by mouth every 4 (four) hours as needed for moderate pain. 09/05/14   Dione Boozeavid Glick, MD  penicillin v potassium (VEETID) 250 MG tablet Take 250 mg by mouth 4 (four) times daily. 01/07/13   Historical Provider, MD  warfarin (COUMADIN) 5 MG tablet Take 5 mg by mouth daily.    Historical Provider, MD   BP 153/73 mmHg   Pulse 71   Temp(Src) 98 F (36.7 C) (Oral)   Resp 16   Ht 5\' 8"  (1.727 m)   Wt 318 lb (144.244 kg)   BMI 48.36 kg/m2   SpO2 100%   LMP 08/27/2014 Physical Exam  Constitutional: She is oriented to person, place, and time. She appears well-developed and well-nourished. No distress.  Obese  female sitting upright, awake and alert, interacting appropriately.   HENT:  Head: Normocephalic and atraumatic.  Eyes: Conjunctivae and EOM are normal.  Cardiovascular: Normal rate and regular rhythm.   Pulmonary/Chest: Effort normal and breath sounds normal. No stridor. No respiratory distress.  Abdominal: She exhibits no distension.  Musculoskeletal: She exhibits no edema.  Gross edema in both lower extremities, greater on the left.  No superficial changes, compartment soft, patient moves her knee and ankle spontaneously, without apparent discomfort.  Neurological: She is alert and oriented to person, place, and time. No cranial nerve deficit.  Skin: Skin is warm and dry. No erythema.  Psychiatric: She has a normal mood and affect. Her behavior is normal.  Nursing note and vitals reviewed.   ED Course  Procedures (including critical care time) Labs Review Labs Reviewed  CBC - Abnormal; Notable for the following:    Hemoglobin 11.8 (*)    All other components within normal limits  URINALYSIS, ROUTINE W REFLEX MICROSCOPIC - Abnormal; Notable for the following:    APPearance CLOUDY (*)    Leukocytes, UA TRACE (*)    All other components within normal limits  URINE MICROSCOPIC-ADD ON - Abnormal; Notable for the following:    Squamous Epithelial / LPF MANY (*)    Bacteria, UA MANY (*)    All other components within normal limits  BASIC METABOLIC PANEL  BRAIN NATRIURETIC PEPTIDE  URINALYSIS, ROUTINE W REFLEX MICROSCOPIC  I-STAT TROPOININ, ED  POC URINE PREG, ED    Imaging Review Dg Chest 2 View  09/05/2014   CLINICAL DATA:  Shortness of breath for 1 day  EXAM: CHEST  2 VIEW  COMPARISON:  01/21/2013  FINDINGS: The heart size and mediastinal contours are within normal limits. Both lungs are clear. The visualized skeletal structures are unremarkable.  IMPRESSION: No active cardiopulmonary disease.   Electronically Signed   By: Alcide CleverMark  Lukens M.D.   On: 09/05/2014 13:57     EKG  Interpretation   Date/Time:  Saturday September 05 2014 12:55:12 EDT Ventricular Rate:  81 PR Interval:  156 QRS Duration: 98 QT Interval:  384 QTC Calculation: 446 R Axis:   75 Text Interpretation:  Sinus rhythm T wave abnormality Abnormal ekg Normal  sinus rhythm Nonspecific T wave abnormality Abnormal ECG Confirmed by  Gerhard MunchLOCKWOOD, Athalia Setterlund  MD [phone] on 09/05/2014 1:43:39 PM     Initial evaluation I reviewed the patient's chart, including visit earlier today.  No records are available for viewing of the patient's  ultrasound with DVT diagnosis from 4 days ago, nor of ED visit.   Patient has returned from vascular.  She is in in no distress. She, her companion and I had a lengthy conversation about DVT, the need to take all medication as directed, to follow-up with her primary care center in 2 days for additional evaluation, management, and of INR level. MDM   Final diagnoses:  SOB (shortness of breath)   leg swelling   Patient presents for second time in one day with ongoing concern of swelling, pain in her lower extremity.  Here the patient is awake, alert, not hypoxic, tachycardic, tachypneic, with no evidence for PE. Patient has recently started her Coumadin, Lovenox, is taking medication as directed.  It is so early in the course, the testing is not likely to change medication. Patient was encouraged to continue take her medication as directed, and with reassuring findings today, will follow up with our primary care center in 2 days for INR testing, further evaluation, management.     Gerhard Munch, MD 09/05/14 [phone]

## 2014-09-05 NOTE — Discharge Instructions (Signed)
As discussed, with your new diagnosis of venous thrombosis, it is very important that you follow-up with our affiliated primary care center for appropriate ongoing management.  Please take all medication as directed, and return here for concerning changes in your condition.

## 2014-09-05 NOTE — ED Notes (Signed)
Pt reports going to [facility] last night due to left leg pain and swelling. Pt was told she had DVT and prescribed warfarin but pt states that no tests or procedures were done during her visit. Pt is now also having sob and states she is retaining fluids to legs and face.

## 2014-09-05 NOTE — ED Notes (Signed)
Pt returned from vascular.

## 2014-09-05 NOTE — ED Notes (Signed)
Vascular repaged.

## 2014-09-05 NOTE — ED Provider Notes (Signed)
CSN: [id number]     Arrival date & time 09/04/14  2303 History   First MD Initiated Contact with Patient 09/05/14 0207     Chief Complaint  Patient presents with   Leg Pain    left   Foot Pain    left     (Consider location/radiation/quality/duration/timing/severity/associated sxs/prior Treatment) Patient is a 29 y.o. female presenting with leg pain and lower extremity pain. The history is provided by the patient.  Leg Pain Foot Pain  She is a pain in her left calf and foot for the last one-2 days. Pain is worse with palpation and worse with standing. She rates pain at 8/10. Of note, she had been on a 5 hour car ride 7 days ago and again 5 days ago. She had been seen in the emergency department at [facility] 2 days ago and diagnosed with DVT in her right leg. She states the top or test was only done on the right side. She was started on enoxaparin and warfarin and started taking the warfarin yesterday. She denies chest pain or dyspnea.  Past Medical History  Diagnosis Date   Abnormal Pap smear     Last pap smear normal    Breast disorder     lump in right breast   Edema of left lower extremity     Since age 85    History of blood clots     right leg   Past Surgical History  Procedure Laterality Date   Colposcopy  2009/2010   Bunionectomy      right foot   Family History  Problem Relation Age of Onset   Hypertension Mother    Asthma Mother    Asthma Brother    Cancer Maternal Uncle     lung   Cancer Maternal Grandmother     brain   Cancer Maternal Grandfather     pancreatic   History  Substance Use Topics   Smoking status: Former Smoker    Quit date: 12/12/2010   Smokeless tobacco: Never Used   Alcohol Use: Yes     Comment: socially   OB History    Gravida Para Term Preterm AB TAB SAB Ectopic Multiple Living   0     Review of Systems  All other systems reviewed and are negative.     Allergies  Review of patient's  allergies indicates no known allergies.  Home Medications   Prior to Admission medications   Medication Sig Start Date End Date Taking? Authorizing Provider  warfarin (COUMADIN) 5 MG tablet Take 5 mg by mouth daily.   Yes Historical Provider, MD  ibuprofen (ADVIL,MOTRIN) 200 MG tablet Take 400-800 mg by mouth every 6 (six) hours as needed for pain.     Historical Provider, MD  ibuprofen (ADVIL,MOTRIN) 400 MG tablet Take 1 tablet (400 mg total) by mouth every 6 (six) hours. Patient not taking: Reported on 09/05/2014 01/21/13   Blake Divine, MD  penicillin v potassium (VEETID) 250 MG tablet Take 250 mg by mouth 4 (four) times daily. 01/07/13   Historical Provider, MD   BP 120/57 mmHg   Pulse 71   Temp(Src) 97.7 F (36.5 C) (Oral)   Resp 16   SpO2 100%   LMP 08/27/2014 Physical Exam  Nursing note and vitals reviewed.  Obese 29 year old female, resting comfortably and in no acute distress. Vital signs are normal. Oxygen saturation is 100%, which is normal.  Head is normocephalic and atraumatic. PERRLA, EOMI. Oropharynx is clear. Neck is nontender and supple without adenopathy or JVD. Back is nontender and there is no CVA tenderness. Lungs are clear without rales, wheezes, or rhonchi. Chest is nontender. Heart has regular rate and rhythm without murmur. Abdomen is soft, flat, nontender without masses or hepatosplenomegaly and peristalsis is normoactive. Extremities have 1+ edema, full range of motion is present. Left calf circumferences 2 cm greater than right calf circumference. There is tenderness to palpation the left calf but no cord is palpable. There is negative Homans sign. Skin is warm and dry without rash. Neurologic: Mental status is normal, cranial nerves are intact, there are no motor or sensory deficits.  ED Course  Procedures (including critical care time)   MDM   Final diagnoses:  DVT (deep venous thrombosis), bilateral  Peripheral edema    Left leg pain and swelling  which is most likely secondary to DVT given the patient's recent diagnosis of DVT in the right leg. She has not been on treatment long enough to expect a clinical response. I do not see an indication for Doppler testing of her left leg since it would not change treatment. Her main concern today is pain so she is given a prescription for oxycodone have acetaminophen for pain. She is also given a prescription for furosemide to help with her edema. She is advised that she will need to have INR checked in about one week. Her first dose of warfarin was yesterday, so there is no value to checking INR today.    Dione Boozeavid Hadlyn Amero, MD 09/05/14 [phone]

## 2014-09-05 NOTE — Discharge Instructions (Signed)
You need to have the INR (blood test for warfarin) checked in one week. Your warfarin dose will be adjusted depending on the test result.   Deep Vein Thrombosis A deep vein thrombosis (DVT) is a blood clot that develops in the deep, larger veins of the leg, arm, or pelvis. These are more dangerous than clots that might form in veins near the surface of the body. A DVT can lead to serious and even life-threatening complications if the clot breaks off and travels in the bloodstream to the lungs.  A DVT can damage the valves in your leg veins so that instead of flowing upward, the blood pools in the lower leg. This is called post-thrombotic syndrome, and it can result in pain, swelling, discoloration, and sores on the leg. CAUSES Usually, several things contribute to the formation of blood clots. Contributing factors include:  The flow of blood slows down.  The inside of the vein is damaged in some way.  You have a condition that makes blood clot more easily. RISK FACTORS Some people are more likely than others to develop blood clots. Risk factors include:   Smoking.  Being overweight (obese).  Sitting or lying still for a long time. This includes long-distance travel, paralysis, or recovery from an illness or surgery. Other factors that increase risk are:   Older age, especially over 41 years of age.  Having a family history of blood clots or if you have already had a blot clot.  Having major or lengthy surgery. This is especially true for surgery on the hip, knee, or belly (abdomen). Hip surgery is particularly high risk.  Having a long, thin tube (catheter) placed inside a vein during a medical procedure.  Breaking a hip or leg.  Having cancer or cancer treatment.  Pregnancy and childbirth.  Hormone changes make the blood clot more easily during pregnancy.  The fetus puts pressure on the veins of the pelvis.  There is a risk of injury to veins during delivery or a caesarean  delivery. The risk is highest just after childbirth.  Medicines containing the female hormone estrogen. This includes birth control pills and hormone replacement therapy.  Other circulation or heart problems.  SIGNS AND SYMPTOMS When a clot forms, it can either partially or totally block the blood flow in that vein. Symptoms of a DVT can include:  Swelling of the leg or arm, especially if one side is much worse.  Warmth and redness of the leg or arm, especially if one side is much worse.  Pain in an arm or leg. If the clot is in the leg, symptoms may be more noticeable or worse when standing or walking. The symptoms of a DVT that has traveled to the lungs (pulmonary embolism, PE) usually start suddenly and include:  Shortness of breath.  Coughing.  Coughing up blood or blood-tinged mucus.  Chest pain. The chest pain is often worse with deep breaths.  Rapid heartbeat. Anyone with these symptoms should get emergency medical treatment right away. Do not wait to see if the symptoms will go away. Call your local emergency services (911 in the [place] if you have these symptoms. Do not drive yourself to the hospital. DIAGNOSIS If a DVT is suspected, your health care provider will take a full medical history and perform a physical exam. Tests that also may be required include:  Blood tests, including studies of the clotting properties of the blood.  Ultrasound to see if you have clots in your legs  or lungs.  X-rays to show the flow of blood when dye is injected into the veins (venogram).  Studies of your lungs if you have any chest symptoms. PREVENTION  Exercise the legs regularly. Take a brisk 30-minute walk every day.  Maintain a weight that is appropriate for your height.  Avoid sitting or lying in bed for long periods of time without moving your legs.  Women, particularly those over the age of 35 years, should consider the risks and benefits of taking estrogen medicines,  including birth control pills.  Do not smoke, especially if you take estrogen medicines.  Long-distance travel can increase your risk of DVT. You should exercise your legs by walking or pumping the muscles every hour.  Many of the risk factors above relate to situations that exist with hospitalization, either for illness, injury, or elective surgery. Prevention may include medical and nonmedical measures.  Your health care provider will assess you for the need for venous thromboembolism prevention when you are admitted to the hospital. If you are having surgery, your surgeon will assess you the day of or day after surgery. TREATMENT Once identified, a DVT can be treated. It can also be prevented in some circumstances. Once you have had a DVT, you may be at increased risk for a DVT in the future. The most common treatment for DVT is blood-thinning (anticoagulant) medicine, which reduces the blood's tendency to clot. Anticoagulants can stop new blood clots from forming and stop old clots from growing. They cannot dissolve existing clots. Your body does this by itself over time. Anticoagulants can be given by mouth, through an IV tube, or by injection. Your health care provider will determine the best program for you. Other medicines or treatments that may be used are:  Heparin or related medicines (low molecular weight heparin) are often the first treatment for a blood clot. They act quickly. However, they cannot be taken orally and must be given either in shot form or by IV tube.  Heparin can cause a fall in a component of blood that stops bleeding and forms blood clots (platelets). You will be monitored with blood tests to be sure this does not occur.  Warfarin is an anticoagulant that can be swallowed. It takes a few days to start working, so usually heparin or related medicines are used in combination. Once warfarin is working, heparin is usually stopped.  Factor Xa inhibitor medicines, such as  rivaroxaban and apixaban, also reduce blood clotting. These medicines are taken orally and can often be used without heparin or related medicines.  Less commonly, clot dissolving drugs (thrombolytics) are used to dissolve a DVT. They carry a high risk of bleeding, so they are used mainly in severe cases where your life or a part of your body is threatened.  Very rarely, a blood clot in the leg needs to be removed surgically.  If you are unable to take anticoagulants, your health care provider may arrange for you to have a filter placed in a main vein in your abdomen. This filter prevents clots from traveling to your lungs. HOME CARE INSTRUCTIONS  Take all medicines as directed by your health care provider.  Learn as much as you can about DVT.  Wear a medical alert bracelet or carry a medical alert card.  Ask your health care provider how soon you can go back to normal activities. It is important to stay active to prevent blood clots. If you are on anticoagulant medicine, avoid contact sports.  It is very important to exercise. This is especially important while traveling, sitting, or standing for long periods of time. Exercise your legs by walking or by tightening and relaxing your leg muscles regularly. Take frequent walks.  You may need to wear compression stockings. These are tight elastic stockings that apply pressure to the lower legs. This pressure can help keep the blood in the legs from clotting. Taking Warfarin Warfarin is a daily medicine that is taken by mouth. Your health care provider will advise you on the length of treatment (usually 3-6 months, sometimes lifelong). If you take warfarin:  Understand how to take warfarin and foods that can affect how warfarin works in [employer] relations account [employer].  Too much and too little warfarin are both dangerous. Too much warfarin increases the risk of bleeding. Too little warfarin continues to allow the risk for blood clots. Warfarin and Regular Blood  Testing While taking warfarin, you will need to have regular blood tests to measure your blood clotting time. These blood tests usually include both the prothrombin time (PT) and international normalized ratio (INR) tests. The PT and INR results allow your health care provider to adjust your dose of warfarin. It is very important that you have your PT and INR tested as often as directed by your health care provider.  Warfarin and Your Diet Avoid major changes in your diet, or notify your health care provider before changing your diet. Arrange a visit with a registered dietitian to answer your questions. Many foods, especially foods high in vitamin K, can interfere with warfarin and affect the PT and INR results. You should eat a consistent amount of foods high in vitamin K. Foods high in vitamin K include:   Spinach, kale, broccoli, cabbage, collard and turnip greens, Brussels sprouts, peas, cauliflower, seaweed, and parsley.  Beef and pork liver.  Green tea.  Soybean oil. Warfarin with Other Medicines Many medicines can interfere with warfarin and affect the PT and INR results. You must:  Tell your health care provider about any and all medicines, vitamins, and supplements you take, including aspirin and other over-the-counter anti-inflammatory medicines. Be especially cautious with aspirin and anti-inflammatory medicines. Ask your health care provider before taking these.  Do not take or discontinue any prescribed or over-the-counter medicine except on the advice of your health care provider or pharmacist. Warfarin Side Effects Warfarin can have side effects, such as easy bruising and difficulty stopping bleeding. Ask your health care provider or pharmacist about other side effects of warfarin. You will need to:  Hold pressure over cuts for longer than usual.  Notify your dentist and other health care providers that you are taking warfarin before you undergo any procedures where bleeding  may occur. Warfarin with Alcohol and Tobacco   Drinking alcohol frequently can increase the effect of warfarin, leading to excess bleeding. It is best to avoid alcoholic drinks or to consume only very small amounts while taking warfarin. Notify your health care provider if you change your alcohol intake.   Do not use any tobacco products including cigarettes, chewing tobacco, or electronic cigarettes. If you smoke, quit. Ask your health care provider for help with quitting smoking. Alternative Medicines to Warfarin: Factor Xa Inhibitor Medicines  These blood-thinning medicines are taken by mouth, usually for several weeks or longer. It is important to take the medicine every single day at the same time each day.  There are no regular blood tests required when using these medicines.  There are fewer food and  drug interactions than with warfarin.  The side effects of this class of medicine are similar to those of warfarin, including excessive bruising or bleeding. Ask your health care provider or pharmacist about other potential side effects. SEEK MEDICAL CARE IF:  You notice a rapid heartbeat.  You feel weaker or more tired than usual.  You feel faint.  You notice increased bruising.  You feel your symptoms are not getting better in the time expected.  You believe you are having side effects of medicine. SEEK IMMEDIATE MEDICAL CARE IF:  You have chest pain.  You have trouble breathing.  You have new or increased swelling or pain in one leg.  You cough up blood.  You notice blood in vomit, in a bowel movement, or in urine. MAKE SURE YOU:  Understand these instructions.  Will watch your condition.  Will get help right away if you are not doing well or get worse. Document Released: 06/05/2005 Document Revised: 10/20/2013 Document Reviewed: 02/10/2013 [facility] Patient Information 2015 [place], [place]. This information is not intended to replace advice given to you by your  health care provider. Make sure you discuss any questions you have with your health care provider.   Edema Edema is an abnormal buildup of fluids in your bodytissues. Edema is somewhatdependent on gravity to pull the fluid to the lowest place in your body. That makes the condition more common in the legs and thighs (lower extremities). Painless swelling of the feet and ankles is common and becomes more likely as you get older. It is also common in looser tissues, like around your eyes.  When the affected area is squeezed, the fluid may move out of that spot and leave a dent for a few moments. This dent is called pitting.  CAUSES  There are many possible causes of edema. Eating too much salt and being on your feet or sitting for a long time can cause edema in your legs and ankles. Hot weather may make edema worse. Common medical causes of edema include:  Heart failure.  Liver disease.  Kidney disease.  Weak blood vessels in your legs.  Cancer.  An injury.  Pregnancy.  Some medications.  Obesity. SYMPTOMS  Edema is usually painless.Your skin may look swollen or shiny.  DIAGNOSIS  Your health care provider may be able to diagnose edema by asking about your medical history and doing a physical exam. You may need to have tests such as X-rays, an electrocardiogram, or blood tests to check for medical conditions that may cause edema.  TREATMENT  Edema treatment depends on the cause. If you have heart, liver, or kidney disease, you need the treatment appropriate for these conditions. General treatment may include:  Elevation of the affected body part above the level of your heart.  Compression of the affected body part. Pressure from elastic bandages or support stockings squeezes the tissues and forces fluid back into the blood vessels. This keeps fluid from entering the tissues.  Restriction of fluid and salt intake.  Use of a water pill (diuretic). These medications are  appropriate only for some types of edema. They pull fluid out of your body and make you urinate more often. This gets rid of fluid and reduces swelling, but diuretics can have side effects. Only use diuretics as directed by your health care provider. HOME CARE INSTRUCTIONS   Keep the affected body part above the level of your heart when you are lying down.   Do not sit still or stand  for prolonged periods.   Do not put anything directly under your knees when lying down.  Do not wear constricting clothing or garters on your upper legs.   Exercise your legs to work the fluid back into your blood vessels. This may help the swelling go down.   Wear elastic bandages or support stockings to reduce ankle swelling as directed by your health care provider.   Eat a low-salt diet to reduce fluid if your health care provider recommends it.   Only take medicines as directed by your health care provider. SEEK MEDICAL CARE IF:   Your edema is not responding to treatment.  You have heart, liver, or kidney disease and notice symptoms of edema.  You have edema in your legs that does not improve after elevating them.   You have sudden and unexplained weight gain. SEEK IMMEDIATE MEDICAL CARE IF:   You develop shortness of breath or chest pain.   You cannot breathe when you lie down.  You develop pain, redness, or warmth in the swollen areas.   You have heart, liver, or kidney disease and suddenly get edema.  You have a fever and your symptoms suddenly get worse. MAKE SURE YOU:   Understand these instructions.  Will watch your condition.  Will get help right away if you are not doing well or get worse. Document Released: 06/05/2005 Document Revised: 10/20/2013 Document Reviewed: 03/28/2013 [facility] Patient Information 2015 [place], [place]. This information is not intended to replace advice given to you by your health care provider. Make sure you discuss any questions you have with  your health care provider.  Furosemide tablets What is this medicine? FUROSEMIDE (fyoor [place] se mide) is a diuretic. It helps you make more urine and to lose salt and excess water from your body. This medicine is used to treat high blood pressure, and edema or swelling from heart, kidney, or liver disease. This medicine may be used for other purposes; ask your health care provider or pharmacist if you have questions. COMMON BRAND NAME(S): Delone, Lasix What should I tell my health care provider before I take this medicine? They need to know if you have any of these conditions: -abnormal blood electrolytes -diarrhea or vomiting -gout -heart disease -kidney disease, small amounts of urine, or difficulty passing urine -liver disease -an unusual or allergic reaction to furosemide, sulfa drugs, other medicines, foods, dyes, or preservatives -pregnant or trying to get pregnant -breast-feeding How should I use this medicine? Take this medicine by mouth with a glass of water. Follow the directions on the prescription label. You may take this medicine with or without food. If it upsets your stomach, take it with food or milk. Do not take your medicine more often than directed. Remember that you will need to pass more urine after taking this medicine. Do not take your medicine at a time of day that will cause you problems. Do not take at bedtime. Talk to your pediatrician regarding the use of this medicine in children. While this drug may be prescribed for selected conditions, precautions do apply. Overdosage: If you think you have taken too much of this medicine contact a poison control center or emergency room at once. NOTE: This medicine is only for you. Do not share this medicine with others. What if I miss a dose? If you miss a dose, take it as soon as you can. If it is almost time for your next dose, take only that dose. Do not take double or extra  doses. What may interact with this  medicine? -aspirin and aspirin-like medicines -certain antibiotics -chloral hydrate -cisplatin -cyclosporine -digoxin -diuretics -laxatives -lithium -medicines for blood pressure -medicines that relax muscles for surgery -methotrexate -NSAIDs, medicines for pain and inflammation like ibuprofen, naproxen, or indomethacin -phenytoin -steroid medicines like prednisone or cortisone -sucralfate This list may not describe all possible interactions. Give your health care provider a list of all the medicines, herbs, non-prescription drugs, or dietary supplements you use. Also tell them if you smoke, drink alcohol, or use illegal drugs. Some items may interact with your medicine. What should I watch for while using this medicine? Visit your doctor or health care professional for regular checks on your progress. Check your blood pressure regularly. Ask your doctor or health care professional what your blood pressure should be, and when you should contact him or her. If you are a diabetic, check your blood sugar as directed. You may need to be on a special diet while taking this medicine. Check with your doctor. Also, ask how many glasses of fluid you need to drink a day. You must not get dehydrated. You may get drowsy or dizzy. Do not drive, use machinery, or do anything that needs mental alertness until you know how this drug affects you. Do not stand or sit up quickly, especially if you are an older patient. This reduces the risk of dizzy or fainting spells. Alcohol can make you more drowsy and dizzy. Avoid alcoholic drinks. This medicine can make you more sensitive to the sun. Keep out of the sun. If you cannot avoid being in the sun, wear protective clothing and use sunscreen. Do not use sun lamps or tanning beds/booths. What side effects may I notice from receiving this medicine? Side effects that you should report to your doctor or health care professional as soon as possible: -blood in urine or  stools -dry mouth -fever or chills -hearing loss or ringing in the ears -irregular heartbeat -muscle pain or weakness, cramps -skin rash -stomach upset, pain, or nausea -tingling or numbness in the hands or feet -unusually weak or tired -vomiting or diarrhea -yellowing of the eyes or skin Side effects that usually do not require medical attention (report to your doctor or health care professional if they continue or are bothersome): -headache -loss of appetite -unusual bleeding or bruising This list may not describe all possible side effects. Call your doctor for medical advice about side effects. You may report side effects to FDA at 1-800-FDA-1088. Where should I keep my medicine? Keep out of the reach of children. Store at room temperature between 15 and 30 degrees C (59 and 86 degrees F). Protect from light. Throw away any unused medicine after the expiration date. NOTE: This sheet is a summary. It may not cover all possible information. If you have questions about this medicine, talk to your doctor, pharmacist, or health care provider.  2015, [employer]. (2009-05-24 16:24:50)  Acetaminophen; Oxycodone tablets What is this medicine? ACETAMINOPHEN; OXYCODONE (a set a MEE noe fen; ox i KOE done) is a pain reliever. It is used to treat mild to moderate pain. This medicine may be used for other purposes; ask your health care provider or pharmacist if you have questions. COMMON BRAND NAME(S): Endocet, Magnacet, Narvox, Percocet, Perloxx, Primalev, Primlev, Roxicet, Xolox What should I tell my health care provider before I take this medicine? They need to know if you have any of these conditions: -brain tumor -Crohn's disease, inflammatory bowel disease, or ulcerative colitis -  drug abuse or addiction -head injury -heart or circulation problems -if you often drink alcohol -kidney disease or problems going to the bathroom -liver disease -lung disease, asthma, or  breathing problems -an unusual or allergic reaction to acetaminophen, oxycodone, other opioid analgesics, other medicines, foods, dyes, or preservatives -pregnant or trying to get pregnant -breast-feeding How should I use this medicine? Take this medicine by mouth with a full glass of water. Follow the directions on the prescription label. Take your medicine at regular intervals. Do not take your medicine more often than directed. Talk to your pediatrician regarding the use of this medicine in children. Special care may be needed. Patients over 5 years old may have a stronger reaction and need a smaller dose. Overdosage: If you think you have taken too much of this medicine contact a poison control center or emergency room at once. NOTE: This medicine is only for you. Do not share this medicine with others. What if I miss a dose? If you miss a dose, take it as soon as you can. If it is almost time for your next dose, take only that dose. Do not take double or extra doses. What may interact with this medicine? -alcohol -antihistamines -barbiturates like amobarbital, butalbital, butabarbital, methohexital, pentobarbital, phenobarbital, thiopental, and secobarbital -benztropine -drugs for bladder problems like solifenacin, trospium, oxybutynin, tolterodine, hyoscyamine, and methscopolamine -drugs for breathing problems like ipratropium and tiotropium -drugs for certain stomach or intestine problems like propantheline, homatropine methylbromide, glycopyrrolate, atropine, belladonna, and dicyclomine -general anesthetics like etomidate, ketamine, nitrous oxide, propofol, desflurane, enflurane, halothane, isoflurane, and sevoflurane -medicines for depression, anxiety, or psychotic disturbances -medicines for sleep -muscle relaxants -naltrexone -narcotic medicines (opiates) for pain -phenothiazines like perphenazine, thioridazine, chlorpromazine, mesoridazine, fluphenazine, prochlorperazine,  promazine, and trifluoperazine -scopolamine -tramadol -trihexyphenidyl This list may not describe all possible interactions. Give your health care provider a list of all the medicines, herbs, non-prescription drugs, or dietary supplements you use. Also tell them if you smoke, drink alcohol, or use illegal drugs. Some items may interact with your medicine. What should I watch for while using this medicine? Tell your doctor or health care professional if your pain does not go away, if it gets worse, or if you have new or a different type of pain. You may develop tolerance to the medicine. Tolerance means that you will need a higher dose of the medication for pain relief. Tolerance is normal and is expected if you take this medicine for a long time. Do not suddenly stop taking your medicine because you may develop a severe reaction. Your body becomes used to the medicine. This does NOT mean you are addicted. Addiction is a behavior related to getting and using a drug for a non-medical reason. If you have pain, you have a medical reason to take pain medicine. Your doctor will tell you how much medicine to take. If your doctor wants you to stop the medicine, the dose will be slowly lowered over time to avoid any side effects. You may get drowsy or dizzy. Do not drive, use machinery, or do anything that needs mental alertness until you know how this medicine affects you. Do not stand or sit up quickly, especially if you are an older patient. This reduces the risk of dizzy or fainting spells. Alcohol may interfere with the effect of this medicine. Avoid alcoholic drinks. There are different types of narcotic medicines (opiates) for pain. If you take more than one type at the same time, you may have more side  effects. Give your health care provider a list of all medicines you use. Your doctor will tell you how much medicine to take. Do not take more medicine than directed. Call emergency for help if you have  problems breathing. The medicine will cause constipation. Try to have a bowel movement at least every 2 to 3 days. If you do not have a bowel movement for 3 days, call your doctor or health care professional. Do not take Tylenol (acetaminophen) or medicines that have acetaminophen with this medicine. Too much acetaminophen can be very dangerous. Many nonprescription medicines contain acetaminophen. Always read the labels carefully to avoid taking more acetaminophen. What side effects may I notice from receiving this medicine? Side effects that you should report to your doctor or health care professional as soon as possible: -allergic reactions like skin rash, itching or hives, swelling of the face, lips, or tongue -breathing difficulties, wheezing -confusion -light headedness or fainting spells -severe stomach pain -unusually weak or tired -yellowing of the skin or the whites of the eyes Side effects that usually do not require medical attention (report to your doctor or health care professional if they continue or are bothersome): -dizziness -drowsiness -nausea -vomiting This list may not describe all possible side effects. Call your doctor for medical advice about side effects. You may report side effects to FDA at 1-800-FDA-1088. Where should I keep my medicine? Keep out of the reach of children. This medicine can be abused. Keep your medicine in a safe place to protect it from theft. Do not share this medicine with anyone. Selling or giving away this medicine is dangerous and against the law. Store at room temperature between 20 and 25 degrees C (68 and 77 degrees F). Keep container tightly closed. Protect from light. This medicine may cause accidental overdose and death if it is taken by other adults, children, or pets. Flush any unused medicine down the toilet to reduce the chance of harm. Do not use the medicine after the expiration date. NOTE: This sheet is a summary. It may not cover  all possible information. If you have questions about this medicine, talk to your doctor, pharmacist, or health care provider.  2015, [employer] Standard. (2013-01-27 13:17:35)

## 2014-09-05 NOTE — ED Notes (Signed)
[facility]ascular aware of patient.

## 2014-10-14 ENCOUNTER — Telehealth: Payer: Self-pay

## 2014-10-14 NOTE — Telephone Encounter (Signed)
Patient called stating she has found 3 lump right breast and wants a call back. Called patient who reports she does not have insurance and has already made an appointment with [facility]-- was seen there in 2013 for similar problem. Informed patient that was the correct course of action. No appointment in clinic necessary.

## 2014-10-20 ENCOUNTER — Other Ambulatory Visit (HOSPITAL_COMMUNITY): Payer: Self-pay | Admitting: *Deleted

## 2014-10-20 DIAGNOSIS — N6310 Unspecified lump in the right breast, unspecified quadrant: Secondary | ICD-10-CM

## 2014-11-11 ENCOUNTER — Encounter (HOSPITAL_COMMUNITY): Payer: Self-pay

## 2014-11-11 ENCOUNTER — Emergency Department (HOSPITAL_COMMUNITY)
Admission: EM | Admit: 2014-11-11 | Discharge: 2014-11-11 | Disposition: A | Discharge status: Home / Self Care | Payer: Medicaid - Out of State | Attending: Emergency Medicine | Admitting: Emergency Medicine

## 2014-11-11 DIAGNOSIS — Z3202 Encounter for pregnancy test, result negative: Secondary | ICD-10-CM | POA: Insufficient documentation

## 2014-11-11 DIAGNOSIS — Z87891 Personal history of nicotine dependence: Secondary | ICD-10-CM | POA: Diagnosis not present

## 2014-11-11 DIAGNOSIS — Z79899 Other long term (current) drug therapy: Secondary | ICD-10-CM | POA: Insufficient documentation

## 2014-11-11 DIAGNOSIS — R509 Fever, unspecified: Secondary | ICD-10-CM | POA: Diagnosis present

## 2014-11-11 DIAGNOSIS — J029 Acute pharyngitis, unspecified: Secondary | ICD-10-CM | POA: Diagnosis not present

## 2014-11-11 DIAGNOSIS — R000 Tachycardia, unspecified: Secondary | ICD-10-CM | POA: Insufficient documentation

## 2014-11-11 DIAGNOSIS — Z7901 Long term (current) use of anticoagulants: Secondary | ICD-10-CM | POA: Insufficient documentation

## 2014-11-11 DIAGNOSIS — Z8742 Personal history of other diseases of the female genital tract: Secondary | ICD-10-CM | POA: Insufficient documentation

## 2014-11-11 DIAGNOSIS — E860 Dehydration: Secondary | ICD-10-CM | POA: Diagnosis not present

## 2014-11-11 LAB — URINALYSIS, ROUTINE W REFLEX MICROSCOPIC
Bilirubin Urine: NEGATIVE
Glucose, UA: NEGATIVE mg/dL
Ketones, ur: NEGATIVE mg/dL
Nitrite: NEGATIVE
Protein, ur: NEGATIVE mg/dL
Specific Gravity, Urine: 1.022 (ref 1.005–1.030)
UROBILINOGEN UA: 1 mg/dL (ref 0.0–1.0)
pH: 6 (ref 5.0–8.0)

## 2014-11-11 LAB — URINE MICROSCOPIC-ADD ON

## 2014-11-11 LAB — PREGNANCY, URINE: PREG TEST UR: NEGATIVE

## 2014-11-11 LAB — RAPID STREP SCREEN (MED CTR MEBANE ONLY): STREPTOCOCCUS, GROUP A SCREEN (DIRECT): NEGATIVE

## 2014-11-11 MED ORDER — SODIUM CHLORIDE 0.9 % IV SOLN
1000.0000 mL | Freq: Once | INTRAVENOUS | Status: AC
  Administered 2014-11-11: 1000 mL via INTRAVENOUS

## 2014-11-11 MED ORDER — KETOROLAC TROMETHAMINE 30 MG/ML IJ SOLN
30.0000 mg | Freq: Once | INTRAMUSCULAR | Status: AC
  Administered 2014-11-11: 30 mg via INTRAVENOUS
  Filled 2014-11-11: qty 1

## 2014-11-11 MED ORDER — MORPHINE SULFATE 4 MG/ML IJ SOLN
4.0000 mg | Freq: Once | INTRAMUSCULAR | Status: AC
  Administered 2014-11-11: 4 mg via INTRAVENOUS
  Filled 2014-11-11: qty 1

## 2014-11-11 MED ORDER — OMEPRAZOLE 20 MG PO CPDR: 20.0000 mg | DELAYED_RELEASE_CAPSULE | Freq: Every day | ORAL | Status: DC

## 2014-11-11 MED ORDER — SODIUM CHLORIDE 0.9 % IV SOLN
1000.0000 mL | INTRAVENOUS | Status: DC
  Administered 2014-11-11: 1000 mL via INTRAVENOUS

## 2014-11-11 NOTE — ED Provider Notes (Signed)
CSN: [id number]     Arrival date & time 11/11/14  1245 History   First MD Initiated Contact with Patient 11/11/14 1312     Chief Complaint  Patient presents with   Fever   Generalized Body Aches      HPI Patient presents emergency department with fever, congestion, body aches and sore throat over the past 24 hours.  Mild decreased oral intake.  Reports myalgias diffusely.  Denies abdominal pain.  No dysuria or urinary frequency.  She denies back pain or flank pain.  No shortness of breath.  Symptoms are moderate in severity.   Past Medical History  Diagnosis Date   Abnormal Pap smear     Last pap smear normal    Breast disorder     lump in right breast   Edema of left lower extremity     Since age 29    History of blood clots     right leg   Past Surgical History  Procedure Laterality Date   Colposcopy  2009/2010   Bunionectomy      right foot   Family History  Problem Relation Age of Onset   Hypertension Mother    Asthma Mother    Asthma Brother    Cancer Maternal Uncle     lung   Cancer Maternal Grandmother     brain   Cancer Maternal Grandfather     pancreatic   History  Substance Use Topics   Smoking status: Former Smoker    Quit date: 12/12/2010   Smokeless tobacco: Never Used   Alcohol Use: Yes     Comment: socially   OB History    Gravida Para Term Preterm AB TAB SAB Ectopic Multiple Living   1    1  1    0     Review of Systems  All other systems reviewed and are negative.     Allergies  Tylenol  Home Medications   Prior to Admission medications   Medication Sig Start Date End Date Taking? Authorizing Provider  albuterol (PROVENTIL HFA;VENTOLIN HFA) 108 (90 BASE) MCG/ACT inhaler Inhale 1-2 puffs into the lungs every 6 (six) hours as needed for wheezing or shortness of breath.   Yes Historical Provider, MD  furosemide (LASIX) 20 MG tablet Take 1 tablet (20 mg total) by mouth daily. 09/05/14  Yes Dione Boozeavid Glick, MD  warfarin  (COUMADIN) 5 MG tablet Take 5 mg by mouth daily.   Yes Historical Provider, MD  oxyCODONE-acetaminophen (PERCOCET) 5-325 MG per tablet Take 1 tablet by mouth every 4 (four) hours as needed for moderate pain. Patient not taking: Reported on 11/11/2014 09/05/14   Dione Boozeavid Glick, MD   BP 141/62 mmHg   Pulse 113   Temp(Src) 102.8 F (39.3 C) (Oral)   Resp 20   SpO2 100%   LMP 10/12/2014 Physical Exam  Constitutional: She is oriented to person, place, and time. She appears well-developed and well-nourished. No distress.  HENT:  Head: Normocephalic and atraumatic.  Eyes: EOM are normal.  Neck: Normal range of motion.  Cardiovascular: Regular rhythm and normal heart sounds.   Tachycardia  Pulmonary/Chest: Effort normal and breath sounds normal.  Abdominal: Soft. She exhibits no distension. There is no tenderness.  Musculoskeletal: Normal range of motion.  Neurological: She is alert and oriented to person, place, and time.  Skin: Skin is warm and dry.  Psychiatric: She has a normal mood and affect. Judgment normal.  Nursing note and vitals reviewed.   ED Course  Procedures (including critical care time) Labs Review Labs Reviewed  URINALYSIS, ROUTINE W REFLEX MICROSCOPIC - Abnormal; Notable for the following:    Hgb urine dipstick SMALL (*)    Leukocytes, UA TRACE (*)    All other components within normal limits  URINE MICROSCOPIC-ADD ON - Abnormal; Notable for the following:    Squamous Epithelial / LPF FEW (*)    All other components within normal limits  RAPID STREP SCREEN  CULTURE, GROUP A STREP  PREGNANCY, URINE    Imaging Review No results found.   EKG Interpretation None      MDM   Final diagnoses:  None    Patient is feeling better at this time.  Rapid strep is negative.  Patient hydrated in the emergency department.  Discharge home in good condition.  Patient understands to return to the ER for new or worsening symptoms.    Azalia Bilis, MD 11/11/14 [phone]

## 2014-11-11 NOTE — ED Notes (Signed)
Pt with fever, congestion, body aches, sore throat since last night.  Denies cough.  States she has not taken any meds for it.

## 2014-11-12 ENCOUNTER — Other Ambulatory Visit: Payer: Medicaid - Out of State

## 2014-11-12 ENCOUNTER — Ambulatory Visit (HOSPITAL_COMMUNITY): Payer: Medicaid - Out of State | Attending: Obstetrics and Gynecology

## 2014-11-13 LAB — CULTURE, GROUP A STREP

## 2014-11-18 ENCOUNTER — Other Ambulatory Visit: Payer: Medicaid - Out of State

## 2015-01-22 ENCOUNTER — Encounter (HOSPITAL_COMMUNITY): Payer: Self-pay | Admitting: Emergency Medicine

## 2015-01-22 ENCOUNTER — Emergency Department (HOSPITAL_COMMUNITY)
Admission: EM | Admit: 2015-01-22 | Discharge: 2015-01-23 | Disposition: A | Discharge status: Home / Self Care | Payer: Medicaid - Out of State | Attending: Emergency Medicine | Admitting: Emergency Medicine

## 2015-01-22 DIAGNOSIS — Z79899 Other long term (current) drug therapy: Secondary | ICD-10-CM | POA: Diagnosis not present

## 2015-01-22 DIAGNOSIS — D649 Anemia, unspecified: Secondary | ICD-10-CM | POA: Diagnosis not present

## 2015-01-22 DIAGNOSIS — Z792 Long term (current) use of antibiotics: Secondary | ICD-10-CM | POA: Insufficient documentation

## 2015-01-22 DIAGNOSIS — Z8742 Personal history of other diseases of the female genital tract: Secondary | ICD-10-CM | POA: Diagnosis not present

## 2015-01-22 DIAGNOSIS — Z86718 Personal history of other venous thrombosis and embolism: Secondary | ICD-10-CM | POA: Diagnosis not present

## 2015-01-22 DIAGNOSIS — R600 Localized edema: Secondary | ICD-10-CM | POA: Diagnosis not present

## 2015-01-22 DIAGNOSIS — R51 Headache: Secondary | ICD-10-CM | POA: Diagnosis present

## 2015-01-22 DIAGNOSIS — Z7901 Long term (current) use of anticoagulants: Secondary | ICD-10-CM | POA: Insufficient documentation

## 2015-01-22 DIAGNOSIS — Z91040 Latex allergy status: Secondary | ICD-10-CM | POA: Diagnosis not present

## 2015-01-22 DIAGNOSIS — Z87891 Personal history of nicotine dependence: Secondary | ICD-10-CM | POA: Diagnosis not present

## 2015-01-22 DIAGNOSIS — R609 Edema, unspecified: Secondary | ICD-10-CM

## 2015-01-22 DIAGNOSIS — R791 Abnormal coagulation profile: Secondary | ICD-10-CM | POA: Diagnosis not present

## 2015-01-22 DIAGNOSIS — R519 Headache, unspecified: Secondary | ICD-10-CM

## 2015-01-22 LAB — BASIC METABOLIC PANEL
Anion gap: 7 (ref 5–15)
BUN: 10 mg/dL (ref 6–20)
CHLORIDE: 105 mmol/L (ref 101–111)
CO2: 26 mmol/L (ref 22–32)
Calcium: 9.1 mg/dL (ref 8.9–10.3)
Creatinine, Ser: 0.67 mg/dL (ref 0.44–1.00)
GFR calc Af Amer: >60 mL/min (ref >60)
GFR calc non Af Amer: >60 mL/min (ref >60)
Glucose, Bld: 94 mg/dL (ref 65–99)
POTASSIUM: 3.7 mmol/L (ref 3.5–5.1)
Sodium: 138 mmol/L (ref 135–145)

## 2015-01-22 LAB — CBC WITH DIFFERENTIAL/PLATELET
BASOS ABS: 0 10*3/uL (ref 0.0–0.1)
Basophils Relative: 0 % (ref 0–1)
Eosinophils Absolute: 0.1 10*3/uL (ref 0.0–0.7)
Eosinophils Relative: 1 % (ref 0–5)
HCT: 34.6 % — ABNORMAL LOW (ref 36.0–46.0)
HEMOGLOBIN: 11.1 g/dL — AB (ref 12.0–15.0)
LYMPHS ABS: 1.7 10*3/uL (ref 0.7–4.0)
Lymphocytes Relative: 28 % (ref 12–46)
MCH: 30.1 pg (ref 26.0–34.0)
MCHC: 32.1 g/dL (ref 30.0–36.0)
MCV: 93.8 fL (ref 78.0–100.0)
MONO ABS: 0.7 10*3/uL (ref 0.1–1.0)
Monocytes Relative: 11 % (ref 3–12)
NEUTROS ABS: 3.7 10*3/uL (ref 1.7–7.7)
NEUTROS PCT: 60 % (ref 43–77)
Platelets: 311 10*3/uL (ref 150–400)
RBC: 3.69 MIL/uL — AB (ref 3.87–5.11)
RDW: 13.3 % (ref 11.5–15.5)
WBC: 6.2 10*3/uL (ref 4.0–10.5)

## 2015-01-22 LAB — PROTIME-INR
INR: 1.05 (ref 0.00–1.49)
Prothrombin Time: 13.9 seconds (ref 11.6–15.2)

## 2015-01-22 MED ORDER — DIPHENHYDRAMINE HCL 50 MG/ML IJ SOLN
25.0000 mg | Freq: Once | INTRAMUSCULAR | Status: AC
  Administered 2015-01-22: 25 mg via INTRAVENOUS
  Filled 2015-01-22: qty 1

## 2015-01-22 MED ORDER — METOCLOPRAMIDE HCL 5 MG/ML IJ SOLN
10.0000 mg | Freq: Once | INTRAMUSCULAR | Status: AC
  Administered 2015-01-22: 10 mg via INTRAVENOUS
  Filled 2015-01-22: qty 2

## 2015-01-22 MED ORDER — SODIUM CHLORIDE 0.9 % IV SOLN
1000.0000 mL | Freq: Once | INTRAVENOUS | Status: AC
  Administered 2015-01-22: 1000 mL via INTRAVENOUS

## 2015-01-22 MED ORDER — SODIUM CHLORIDE 0.9 % IV SOLN: 1000.0000 mL | INTRAVENOUS | Status: DC

## 2015-01-22 NOTE — ED Notes (Signed)
Pt c/o migraine headache x one week. Pt also has bilateral leg swelling, worse on L leg x 2 weeks. C/o pain to lower legs. Pt has taken generic sinus congestion medication for pain with some relief. Pt states she has some nausea. Denies vomiting. Pt c/o photophobia. Pt a/o x 4. Skin warm, dry. Rates pain 9/10.

## 2015-01-22 NOTE — ED Provider Notes (Signed)
CSN: [id number]     Arrival date & time 01/22/15  2002 History   First MD Initiated Contact with Patient 01/22/15 2216     Chief Complaint  Patient presents with   Migraine   Leg Swelling     (Consider location/radiation/quality/duration/timing/severity/associated sxs/prior Treatment) Patient is a 29 y.o. female presenting with migraines. The history is provided by the patient.  Migraine  She has been having a right sided headache off and on for the last week. Pain is dull and throbbing and she rates it at 9/10. There is associated photophobia and phonophobia. 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 She took some sinus pills without any benefit. There is no associated nausea or vomiting. There is no visual disturbance. There is no weakness, numbness, tingling. She is also noted that her legs have been swelling for the last 2 weeks. She is currently on warfarin for DVT.  Past Medical History  Diagnosis Date   Abnormal Pap smear     Last pap smear normal    Breast disorder     lump in right breast   Edema of left lower extremity     Since age 66    History of blood clots     right leg   Past Surgical History  Procedure Laterality Date   Colposcopy  2009/2010   Bunionectomy      right foot   Family History  Problem Relation Age of Onset   Hypertension Mother    Asthma Mother    Asthma Brother    Cancer Maternal Uncle     lung   Cancer Maternal Grandmother     brain   Cancer Maternal Grandfather     pancreatic   History  Substance Use Topics   Smoking status: Former Smoker    Quit date: 12/12/2010   Smokeless tobacco: Never Used   Alcohol Use: Yes     Comment: socially   OB History    Gravida Para Term Preterm AB TAB SAB Ectopic Multiple Living   1    1  1    0     Review of Systems  All other systems reviewed and are negative.     Allergies  Aspirin and Latex  Home Medications   Prior to Admission  medications   Medication Sig Start Date End Date Taking? Authorizing Provider  cephALEXin (KEFLEX) 500 MG capsule Take 500 mg by mouth 3 (three) times daily.   Yes Historical Provider, MD  furosemide (LASIX) 20 MG tablet Take 1 tablet (20 mg total) by mouth daily. 09/05/14  Yes Dione Booze, MD  omeprazole (PRILOSEC) 20 MG capsule Take 1 capsule (20 mg total) by mouth daily. 11/11/14  Yes Azalia Bilis, MD  warfarin (COUMADIN) 5 MG tablet Take 5 mg by mouth daily.   Yes Historical Provider, MD  oxyCODONE-acetaminophen (PERCOCET) 5-325 MG per tablet Take 1 tablet by mouth every 4 (four) hours as needed for moderate pain. Patient not taking: Reported on 11/11/2014 09/05/14   Dione Booze, MD   BP 145/102 mmHg   Pulse 72   Temp(Src) 98.3 F (36.8 C) (Oral)   Resp 15   SpO2 100%   LMP 01/14/2015 Physical Exam  Nursing note and vitals reviewed.  29 year old female, resting comfortably and in no acute distress. Vital signs are significant for hypertension. Oxygen saturation is 100%, which is normal. Head is normocephalic and atraumatic. PERRLA, EOMI. Oropharynx is clear. Fundi show no hemorrhage, exudate,  or papilledema. There is tenderness palpation over frontal and max a sinuses but also over the temporalis muscle on the right. There is no edema or of the turbinates and no nasal drainage. Neck is nontender and supple without adenopathy or JVD. Back is nontender and there is no CVA tenderness. Lungs are clear without rales, wheezes, or rhonchi. Chest is nontender. Heart has regular rate and rhythm without murmur. Abdomen is soft, flat, nontender without masses or hepatosplenomegaly and peristalsis is normoactive. Extremities have 2+ edema, full range of motion is present. Skin is warm and dry without rash. Neurologic: Mental status is normal, cranial nerves are intact, there are no motor or sensory deficits.  ED Course  Procedures (including critical care time) Labs Review Results for orders placed  or performed during the hospital encounter of 01/22/15  Basic metabolic panel  Result Value Ref Range   Sodium 138 135 - 145 mmol/L   Potassium 3.7 3.5 - 5.1 mmol/L   Chloride 105 101 - 111 mmol/L   CO2 26 22 - 32 mmol/L   Glucose, Bld 94 65 - 99 mg/dL   BUN 10 6 - 20 mg/dL   Creatinine, Ser 1.61 0.44 - 1.00 mg/dL   Calcium 9.1 8.9 - 09.6 mg/dL   GFR calc non Af Amer >60 >60 mL/min   GFR calc Af Amer >60 >60 mL/min   Anion gap 7 5 - 15  CBC with Differential  Result Value Ref Range   WBC 6.2 4.0 - 10.5 K/uL   RBC 3.69 (L) 3.87 - 5.11 MIL/uL   Hemoglobin 11.1 (L) 12.0 - 15.0 g/dL   HCT 04.5 (L) 40.9 - 81.1 %   MCV 93.8 78.0 - 100.0 fL   MCH 30.1 26.0 - 34.0 pg   MCHC 32.1 30.0 - 36.0 g/dL   RDW 91.4 78.2 - 95.6 %   Platelets 311 150 - 400 K/uL   Neutrophils Relative % 60 43 - 77 %   Neutro Abs 3.7 1.7 - 7.7 K/uL   Lymphocytes Relative 28 12 - 46 %   Lymphs Abs 1.7 0.7 - 4.0 K/uL   Monocytes Relative 11 3 - 12 %   Monocytes Absolute 0.7 0.1 - 1.0 K/uL   Eosinophils Relative 1 0 - 5 %   Eosinophils Absolute 0.1 0.0 - 0.7 K/uL   Basophils Relative 0 0 - 1 %   Basophils Absolute 0.0 0.0 - 0.1 K/uL  Protime-INR  Result Value Ref Range   Prothrombin Time 13.9 11.6 - 15.2 seconds   INR 1.05 0.00 - 1.49    MDM   Final diagnoses:  Headache, unspecified headache type  Peripheral edema  Subtherapeutic international normalized ratio (INR)  Normochromic normocytic anemia    Headache of uncertain cause. Doubt sinusitis. Possible muscle contraction headache, possible migraine variant. Peripheral edema. Old records are reviewed and she has been on warfarin for DVT for the last 4 months. Edema certainly could be related to DVT. She will need to be on some diabetics. She is given a migraine cocktail of IV fluids, metoclopramide, and diphenhydramine and will be reassessed.  Laboratory workup is significant for mild anemia. INR is noted to be subtherapeutic. When I went back to  reevaluate the patient, she had apparently left AGAINST MEDICAL ADVICE before I could reevaluate her. She reported to the nurse that her headache had resolved.  Dione Booze, MD 01/23/15 [facility] Manners

## 2015-03-08 ENCOUNTER — Other Ambulatory Visit (HOSPITAL_COMMUNITY): Payer: Self-pay | Admitting: *Deleted

## 2015-03-08 DIAGNOSIS — N6310 Unspecified lump in the right breast, unspecified quadrant: Secondary | ICD-10-CM

## 2015-03-12 ENCOUNTER — Other Ambulatory Visit (HOSPITAL_COMMUNITY): Payer: Self-pay | Admitting: Obstetrics and Gynecology

## 2015-03-12 ENCOUNTER — Ambulatory Visit (HOSPITAL_COMMUNITY)
Admission: RE | Admit: 2015-03-12 | Discharge: 2015-03-12 | Disposition: A | Discharge status: Home / Self Care | Payer: Medicaid - Out of State | Source: Ambulatory Visit | Attending: Obstetrics and Gynecology | Admitting: Obstetrics and Gynecology

## 2015-03-12 ENCOUNTER — Encounter (HOSPITAL_COMMUNITY): Payer: Self-pay

## 2015-03-12 ENCOUNTER — Ambulatory Visit
Admission: RE | Admit: 2015-03-12 | Discharge: 2015-03-12 | Disposition: A | Discharge status: Home / Self Care | Payer: No Typology Code available for payment source | Source: Ambulatory Visit | Attending: Obstetrics and Gynecology | Admitting: Obstetrics and Gynecology

## 2015-03-12 VITALS — BP 114/72 | Temp 98.1°F | Ht 68.0 in | Wt 322.0 lb

## 2015-03-12 DIAGNOSIS — N6310 Unspecified lump in the right breast, unspecified quadrant: Secondary | ICD-10-CM

## 2015-03-12 DIAGNOSIS — Z1239 Encounter for other screening for malignant neoplasm of breast: Secondary | ICD-10-CM

## 2015-03-12 DIAGNOSIS — N6315 Unspecified lump in the right breast, overlapping quadrants: Secondary | ICD-10-CM

## 2015-03-12 HISTORY — DX: Unspecified asthma, uncomplicated: J45.909

## 2015-03-12 NOTE — Progress Notes (Signed)
CLINIC:   [facility]  REASON FOR VISIT: Well-woman exam with routine gynecological exam  HISTORY OF PRESENT ILLNESS:   Ms. Blackston is a 29 y.o. female who presents to the [facility] today for clinical breast exam and routine gynecological exam. Patient currently has her period and pelvic cannot be performed today. Had a previous breast U/S in July 2013 with cyst aspiration.  Her last pap smear was performed in May 2013 and was negative. Had an abnormal pap 4-5 years ago with colposcopy.   REVIEW OF SYSTEMS:   Reports right breast mass for the past 2-3 months. Thinks area is getting bigger. Denies breast pain, skin changes, nipple inversion, or nipple discharge bilaterally.  Denies any pelvic pain, pressure, or abnormal vaginal bleeding.   ALLERGIES: Allergies  Allergen Reactions   Aspirin Other (See Comments)    Can not take due to ulcer   Latex Hives and Swelling    MEDICATIONS:  Current outpatient prescriptions:    furosemide (LASIX) 20 MG tablet, Take 1 tablet (20 mg total) by mouth daily., Disp: 15 tablet, Rfl: 0   warfarin (COUMADIN) 5 MG tablet, Take 5 mg by mouth daily., Disp: , Rfl:    cephALEXin (KEFLEX) 500 MG capsule, Take 500 mg by mouth 3 (three) times daily., Disp: , Rfl:    omeprazole (PRILOSEC) 20 MG capsule, Take 1 capsule (20 mg total) by mouth daily. (Patient not taking: Reported on 03/12/2015), Disp: 30 capsule, Rfl: 0   oxyCODONE-acetaminophen (PERCOCET) 5-325 MG per tablet, Take 1 tablet by mouth every 4 (four) hours as needed for moderate pain. (Patient not taking: Reported on 11/11/2014), Disp: 20 tablet, Rfl: 0  PHYSICAL EXAM:   BP 114/72 mmHg   Temp(Src) 98.1 F (36.7 C)   Ht  (1.727 m)   Wt 322 lb (146.058 kg)   BMI 48.97 kg/m2   LMP 03/08/2015 (Exact Date)  General: Well-nourished, well-appearing female in no acute distress.  She is accompanied by her wife in clinic today.  Stoney Bang, LPN was  present during physical exam for this patient.   Breasts: Bilateral breasts exposed and observed with patient standing (arms at side, arms on hips, arms on hips flexed forward, and arms over head).  No gross abnormalities including breast skin puckering or dimpling noted on observation.  Breasts symmetrical without evidence of skin redness, thickening, or peau dorange appearance. No nipple retraction or nipple discharge noted bilaterally.  No breast nodularity palpated in the left breast. Right breast with a 3/4 cm mass at the 9 o'clock position and 0.5 cm mass at the 12 o'clock position. Axillary lymph nodes: No axillary lymphadenopathy bilaterally.     ASSESSMENT & PLAN:  1. Breast cancer screening: Ms. Michie has 2 palpable masses in her right breast on clinical breast exam today.  She will receive a breast U/S as scheduled.  Results will be discussed with the patient by the imaging center. She was given instructions and educational materials regarding breast self-awareness. Ms. Geng is aware of this plan and agrees with it.   2. Cervical cancer screening: Ms. Longhi currently has her period. Pelvic exam not performed today. She will be scheduled back to the clinic at a later date for her pap.     Ms. Ravert was encouraged to ask questions and all questions were answered to her satisfaction.      Clenton Pare, DNP, AGPCNP-BC, [facility] [phone]

## 2015-04-22 ENCOUNTER — Emergency Department (HOSPITAL_COMMUNITY)
Admission: EM | Admit: 2015-04-22 | Discharge: 2015-04-22 | Disposition: A | Discharge status: Home / Self Care | Payer: Self-pay | Attending: Emergency Medicine | Admitting: Emergency Medicine

## 2015-04-22 ENCOUNTER — Encounter (HOSPITAL_COMMUNITY): Payer: Self-pay | Admitting: Emergency Medicine

## 2015-04-22 ENCOUNTER — Emergency Department (HOSPITAL_COMMUNITY): Payer: No Typology Code available for payment source

## 2015-04-22 DIAGNOSIS — R197 Diarrhea, unspecified: Secondary | ICD-10-CM | POA: Insufficient documentation

## 2015-04-22 DIAGNOSIS — R042 Hemoptysis: Secondary | ICD-10-CM

## 2015-04-22 DIAGNOSIS — R1084 Generalized abdominal pain: Secondary | ICD-10-CM | POA: Insufficient documentation

## 2015-04-22 DIAGNOSIS — Z7901 Long term (current) use of anticoagulants: Secondary | ICD-10-CM | POA: Insufficient documentation

## 2015-04-22 DIAGNOSIS — R51 Headache: Secondary | ICD-10-CM | POA: Insufficient documentation

## 2015-04-22 DIAGNOSIS — Z8742 Personal history of other diseases of the female genital tract: Secondary | ICD-10-CM | POA: Insufficient documentation

## 2015-04-22 DIAGNOSIS — Z91040 Latex allergy status: Secondary | ICD-10-CM | POA: Insufficient documentation

## 2015-04-22 DIAGNOSIS — R110 Nausea: Secondary | ICD-10-CM | POA: Insufficient documentation

## 2015-04-22 DIAGNOSIS — H53149 Visual discomfort, unspecified: Secondary | ICD-10-CM | POA: Insufficient documentation

## 2015-04-22 DIAGNOSIS — J45909 Unspecified asthma, uncomplicated: Secondary | ICD-10-CM | POA: Insufficient documentation

## 2015-04-22 DIAGNOSIS — Z3202 Encounter for pregnancy test, result negative: Secondary | ICD-10-CM | POA: Insufficient documentation

## 2015-04-22 DIAGNOSIS — Z86718 Personal history of other venous thrombosis and embolism: Secondary | ICD-10-CM | POA: Insufficient documentation

## 2015-04-22 DIAGNOSIS — Z79899 Other long term (current) drug therapy: Secondary | ICD-10-CM | POA: Insufficient documentation

## 2015-04-22 DIAGNOSIS — Z720 Tobacco use: Secondary | ICD-10-CM | POA: Insufficient documentation

## 2015-04-22 DIAGNOSIS — R519 Headache, unspecified: Secondary | ICD-10-CM

## 2015-04-22 LAB — COMPREHENSIVE METABOLIC PANEL
ALT: 8 U/L — AB (ref 14–54)
AST: 14 U/L — ABNORMAL LOW (ref 15–41)
Albumin: 4.1 g/dL (ref 3.5–5.0)
Alkaline Phosphatase: 43 U/L (ref 38–126)
Anion gap: 6 (ref 5–15)
BUN: 11 mg/dL (ref 6–20)
CO2: 26 mmol/L (ref 22–32)
CREATININE: 0.77 mg/dL (ref 0.44–1.00)
Calcium: 9.4 mg/dL (ref 8.9–10.3)
Chloride: 106 mmol/L (ref 101–111)
GFR calc Af Amer: >60 mL/min (ref >60)
Glucose, Bld: 98 mg/dL (ref 65–99)
Potassium: 3.9 mmol/L (ref 3.5–5.1)
SODIUM: 138 mmol/L (ref 135–145)
Total Bilirubin: 0.7 mg/dL (ref 0.3–1.2)
Total Protein: 7.7 g/dL (ref 6.5–8.1)

## 2015-04-22 LAB — CBC
HCT: 34.3 % — ABNORMAL LOW (ref 36.0–46.0)
Hemoglobin: 11.3 g/dL — ABNORMAL LOW (ref 12.0–15.0)
MCH: 30.7 pg (ref 26.0–34.0)
MCHC: 32.9 g/dL (ref 30.0–36.0)
MCV: 93.2 fL (ref 78.0–100.0)
Platelets: 345 10*3/uL (ref 150–400)
RBC: 3.68 MIL/uL — ABNORMAL LOW (ref 3.87–5.11)
RDW: 13 % (ref 11.5–15.5)
WBC: 8.4 10*3/uL (ref 4.0–10.5)

## 2015-04-22 LAB — URINALYSIS, ROUTINE W REFLEX MICROSCOPIC
Bilirubin Urine: NEGATIVE
GLUCOSE, UA: NEGATIVE mg/dL
HGB URINE DIPSTICK: NEGATIVE
KETONES UR: NEGATIVE mg/dL
Nitrite: NEGATIVE
PROTEIN: NEGATIVE mg/dL
Specific Gravity, Urine: 1.024 (ref 1.005–1.030)
UROBILINOGEN UA: 1 mg/dL (ref 0.0–1.0)
pH: 6.5 (ref 5.0–8.0)

## 2015-04-22 LAB — URINE MICROSCOPIC-ADD ON

## 2015-04-22 LAB — HCG, QUANTITATIVE, PREGNANCY: hCG, Beta Chain, Quant, S: <1 m[IU]/mL (ref <5)

## 2015-04-22 LAB — D-DIMER, QUANTITATIVE: D-Dimer, Quant: <0.27 ug/mL-FEU (ref 0.00–0.48)

## 2015-04-22 LAB — LIPASE, BLOOD: Lipase: 30 U/L (ref 11–51)

## 2015-04-22 LAB — PROTIME-INR
INR: 1.03 (ref 0.00–1.49)
Prothrombin Time: 13.7 seconds (ref 11.6–15.2)

## 2015-04-22 MED ORDER — DIPHENHYDRAMINE HCL 50 MG/ML IJ SOLN
25.0000 mg | Freq: Once | INTRAMUSCULAR | Status: AC
  Administered 2015-04-22: 25 mg via INTRAVENOUS
  Filled 2015-04-22: qty 1

## 2015-04-22 MED ORDER — PROCHLORPERAZINE EDISYLATE 5 MG/ML IJ SOLN
10.0000 mg | Freq: Four times a day (QID) | INTRAMUSCULAR | Status: DC | PRN
  Administered 2015-04-22: 10 mg via INTRAVENOUS
  Filled 2015-04-22: qty 2

## 2015-04-22 MED ORDER — ONDANSETRON 4 MG PO TBDP
4.0000 mg | ORAL_TABLET | Freq: Once | ORAL | Status: AC | PRN
  Administered 2015-04-22: 4 mg via ORAL
  Filled 2015-04-22: qty 1

## 2015-04-22 MED ORDER — MORPHINE SULFATE (PF) 4 MG/ML IV SOLN
4.0000 mg | Freq: Once | INTRAVENOUS | Status: AC
  Administered 2015-04-22: 4 mg via INTRAVENOUS
  Filled 2015-04-22: qty 1

## 2015-04-22 NOTE — ED Provider Notes (Signed)
CSN: [id number]     Arrival date & time 04/22/15  1922 History   First MD Initiated Contact with Patient 04/22/15 2036     Chief Complaint  Patient presents with   Abdominal Pain   Hemoptysis   Headache     (Consider location/radiation/quality/duration/timing/severity/associated sxs/prior Treatment) HPI Cassandra White is a 29 y.o. female with history of DVT, asthma, presents to emergency department with multiple complaints. Patient reports intermittent abdominal pain for 2 weeks. She points to the upper abdomen but states entire abdomen hurts. Patient is also complaining of intermittent hemoptysis for one week. Reports bright red blood on the tissue. Denies chest pain or cough. Denies URI symptoms except for sneezing. Denies any nasal bleed. Also reports headache that started yesterday. States headache is on the left side, does not radiate. Reports associated photophobia and nausea. Patient is on Coumadin for prior blood clots in her legs. Denies history of PE. States she is compliant with her Coumadin but does not go to get her levels checked regularly because of financial reasons. She denies any recent illnesses. Denies any fever or chills. She does state that she has some nausea and diarrhea, denies vomiting. Denies any urinary symptoms or vaginal discharge or bleeding.  Past Medical History  Diagnosis Date   Abnormal Pap smear     Last pap smear normal    Breast disorder     lump in right breast   Edema of left lower extremity     Since age 40    History of blood clots     right leg   Asthma    Past Surgical History  Procedure Laterality Date   Colposcopy  2009/2010   Bunionectomy      right foot   Family History  Problem Relation Age of Onset   Hypertension Mother    Asthma Mother    Asthma Brother    Cancer Maternal Uncle     lung   Cancer Maternal Grandmother     brain   Cancer Maternal Grandfather     pancreatic   Social History  Substance Use  Topics   Smoking status: Current Some Day Smoker    Types: Cigarettes   Smokeless tobacco: Never Used   Alcohol Use: Yes     Comment: socially   OB History    Gravida Para Term Preterm AB TAB SAB Ectopic Multiple Living   0     Review of Systems  Constitutional: Negative for fever and chills.  HENT: Negative for congestion and sore throat.   Eyes: Positive for photophobia.  Respiratory: Negative for cough, chest tightness and shortness of breath.   Cardiovascular: Negative for chest pain, palpitations and leg swelling.  Gastrointestinal: Positive for nausea, abdominal pain and diarrhea. Negative for vomiting.  Genitourinary: Negative for dysuria, flank pain, vaginal bleeding, vaginal discharge, vaginal pain and pelvic pain.  Musculoskeletal: Negative for myalgias, arthralgias, neck pain and neck stiffness.  Skin: Negative for rash.  Neurological: Positive for headaches. Negative for dizziness and weakness.  All other systems reviewed and are negative.     Allergies  Aspirin and Latex  Home Medications   Prior to Admission medications   Medication Sig Start Date End Date Taking? Authorizing Provider  acetaminophen (TYLENOL) 325 MG tablet Take 650 mg by mouth every 6 (six) hours as needed for mild pain or headache.   Yes Historical Provider, MD  furosemide (LASIX) 20 MG tablet Take 1 tablet (  20 mg total) by mouth daily. Patient taking differently: Take 20 mg by mouth daily as needed for fluid.  09/05/14  Yes Dione Boozeavid Glick, MD  omeprazole (PRILOSEC) 20 MG capsule Take 1 capsule (20 mg total) by mouth daily. 11/11/14  Yes Azalia BilisKevin Campos, MD  warfarin (COUMADIN) 5 MG tablet Take 5 mg by mouth daily.   Yes Historical Provider, MD  oxyCODONE-acetaminophen (PERCOCET) 5-325 MG per tablet Take 1 tablet by mouth every 4 (four) hours as needed for moderate pain. Patient not taking: Reported on 11/11/2014 09/05/14   Dione Boozeavid Glick, MD   BP 145/95 mmHg   Temp(Src) 98.2 F (36.8 C)  (Oral)   Resp 20   SpO2 100%   LMP 04/09/2015 (Approximate) Physical Exam  Constitutional: She is oriented to person, place, and time. She appears well-developed and well-nourished. No distress.  Morbidly obese  HENT:  Head: Normocephalic.  Eyes: Conjunctivae and EOM are normal. Pupils are equal, round, and reactive to light.  Neck: Normal range of motion. Neck supple.  Cardiovascular: Normal rate, regular rhythm and normal heart sounds.   Pulmonary/Chest: Effort normal and breath sounds normal. No respiratory distress. She has no wheezes. She has no rales.  Abdominal: Soft. Bowel sounds are normal. She exhibits no distension. There is tenderness. There is no rebound.  Diffuse tenderness  Musculoskeletal: She exhibits no edema.  Neurological: She is alert and oriented to person, place, and time. No cranial nerve deficit. Coordination normal.  Skin: Skin is warm and dry.  Psychiatric: She has a normal mood and affect. Her behavior is normal.  Nursing note and vitals reviewed.   ED Course  Procedures (including critical care time) Labs Review Labs Reviewed  COMPREHENSIVE METABOLIC PANEL - Abnormal; Notable for the following:    AST 14 (*)    ALT 8 (*)    All other components within normal limits  CBC - Abnormal; Notable for the following:    RBC 3.68 (*)    Hemoglobin 11.3 (*)    HCT 34.3 (*)    All other components within normal limits  LIPASE, BLOOD  HCG, QUANTITATIVE, PREGNANCY  PROTIME-INR  URINALYSIS, ROUTINE W REFLEX MICROSCOPIC (NOT AT [facility])  D-DIMER, QUANTITATIVE (NOT AT [facility])    Imaging Review Dg Chest 2 View  04/22/2015  CLINICAL DATA:  Hemoptysis and left sided headaches x1 week. Hx of DM, blood clots in legs. Occasional smoker. EXAM: CHEST  2 VIEW COMPARISON:  09/05/2014 FINDINGS: Lateral view degraded by patient arm position. Midline trachea. Normal heart size and mediastinal contours.Sharp costophrenic angles. No pneumothorax. Clear lungs. IMPRESSION: No active  cardiopulmonary disease. Electronically Signed   By: Jeronimo GreavesKyle  Talbot M.D.   On: 04/22/2015 21:27   I have personally reviewed and evaluated these images and lab results as part of my medical decision-making.   EKG Interpretation None      MDM   Final diagnoses:  Nonintractable headache, unspecified chronicity pattern, unspecified headache type  Hemoptysis    Patient emergency department with multiple complaints. She is complaining of diffuse abdominal pain, headache, hemoptysis. Her exam is unremarkable except for diffuse abdominal tenderness with no guarding or rebound tenderness. Her neurological exam is normal. We'll try Compazine and Benadryl for treatment of her headache. At this time although considered venous sinus thrombosis, doubt given history of similar headaches in the past. Patient's blood work is pending, INR is back at 1. She is subtherapeutic. This is concerning given her history of DVTs and now hemoptysis. Will add d-dimer to the  labs, also will add chest x-ray. She again denies any chest pain or shortness of breath. Will try morphine for abdominal pain.   Patient feels much better after Compazine, morphine, Benadryl. Her symptoms basically resolved. Her d-dimer is negative, which along with normal vital signs and normal exam most suggestive that her symptoms are not from PE or venous sinus thrombosis. Her abdomen again is nontender at this time, but no guarding or rebound tenderness. Do not think she needs any imaging of her abdomen. Labs normal. Headache resolved. We'll discharge home with somatic treatment and follow-up.  Filed Vitals:   04/22/15 1943 04/22/15 2130 04/22/15 2345  BP: 145/95 125/79 121/75  Pulse:  62 84  Temp: 98.2 F (36.8 C)    TempSrc: Oral    Resp: SpO2: 100% 100% 100%     Jaynie Crumble, PA-C 04/23/15 0018  Marily Memos, MD 04/27/15 2110

## 2015-04-22 NOTE — ED Notes (Addendum)
Patient states she is coughing up blood, having severe abd pain, and a left sided headed. Patient reports symptoms have been intermittent x1 week. Patient denies seeking any treatment for these problems. Patient states she took tylenol earlier today for the headache but it didn't help. Patient takes coumadin.

## 2015-04-22 NOTE — Discharge Instructions (Signed)
Ibuprofen/tylenol for pain. You can try Excedrin migraine for headache. Drink plenty of fluids. Follow up with primary care doctor. Return if worsening.    General Headache Without Cause A headache is pain or discomfort felt around the head or neck area. The specific cause of a headache may not be found. There are many causes and types of headaches. A few common ones are:  Tension headaches.  Migraine headaches.  Cluster headaches.  Chronic daily headaches. HOME CARE INSTRUCTIONS  Watch your condition for any changes. Take these steps to help with your condition: Managing Pain  Take over-the-counter and prescription medicines only as told by your health care provider.  Lie down in a dark, quiet room when you have a headache.  If directed, apply ice to the head and neck area:  Put ice in a plastic bag.  Place a towel between your skin and the bag.  Leave the ice on for 20 minutes, 2-3 times per day.  Use a heating pad or hot shower to apply heat to the head and neck area as told by your health care provider.  Keep lights dim if bright lights bother you or make your headaches worse. Eating and Drinking  Eat meals on a regular schedule.  Limit alcohol use.  Decrease the amount of caffeine you drink, or stop drinking caffeine. General Instructions  Keep all follow-up visits as told by your health care provider. This is important.  Keep a headache journal to help find out what may trigger your headaches. For example, write down:  What you eat and drink.  How much sleep you get.  Any change to your diet or medicines.  Try massage or other relaxation techniques.  Limit stress.  Sit up straight, and do not tense your muscles.  Do not use tobacco products, including cigarettes, chewing tobacco, or e-cigarettes. If you need help quitting, ask your health care provider.  Exercise regularly as told by your health care provider.  Sleep on a regular schedule. Get 7-9  hours of sleep, or the amount recommended by your health care provider. SEEK MEDICAL CARE IF:   Your symptoms are not helped by medicine.  You have a headache that is different from the usual headache.  You have nausea or you vomit.  You have a fever. SEEK IMMEDIATE MEDICAL CARE IF:   Your headache becomes severe.  You have repeated vomiting.  You have a stiff neck.  You have a loss of vision.  You have problems with speech.  You have pain in the eye or ear.  You have muscular weakness or loss of muscle control.  You lose your balance or have trouble walking.  You feel faint or pass out.  You have confusion.   This information is not intended to replace advice given to you by your health care provider. Make sure you discuss any questions you have with your health care provider.   Document Released: 06/05/2005 Document Revised: 02/24/2015 Document Reviewed: 09/28/2014 Elsevier Interactive Patient Education 2016 [employer].  Hemoptysis Hemoptysis, which means coughing up blood, can be a sign of a minor problem or a serious medical condition. The blood that is coughed up may come from the lungs and airways. Coughed-up blood can also come from bleeding that occurs outside the lungs and airways. Blood can drain into the windpipe during a severe nosebleed or when blood is vomited from the stomach. Because hemoptysis can be a sign of something serious, a medical evaluation is required. For some  people with hemoptysis, no definite cause is ever identified. CAUSES  The most common cause of hemoptysis is bronchitis. Some other common causes include:   A ruptured blood vessel caused by coughing or an infection.   A medical condition that causes damage to the large air passageways (bronchiectasis).   A blood clot in the lungs (pulmonary embolism).   Pneumonia.   Tuberculosis.   Breathing in a small foreign object.   Cancer. For some people with hemoptysis, no  definite cause is ever identified.  HOME CARE INSTRUCTIONS  Only take over-the-counter or prescription medicines as directed by your caregiver. Do not use cough suppressants unless your caregiver approves.  If your caregiver prescribes antibiotic medicines, take them as directed. Finish them even if you start to feel better.  Do not smoke. Also avoid secondhand smoke.  Follow up with your caregiver as directed. SEEK IMMEDIATE MEDICAL CARE IF:   You cough up bloody mucus for longer than a week.  You have a blood-producing cough that is severe or getting worse.  You have a blood-producing cough thatcomes and goes over time.  You develop problems with your breathing.   You vomit blood.  You develop bloody or black-colored stools.  You have chest pain.   You develop night sweats.  You feel faint or pass out.   You have a fever or persistent symptoms for more than 2-3 days.  You have a fever and your symptoms suddenly get worse. MAKE SURE YOU:  Understand these instructions.  Will watch your condition.  Will get help right away if you are not doing well or get worse.   This information is not intended to replace advice given to you by your health care provider. Make sure you discuss any questions you have with your health care provider.   Document Released: 08/14/2001 Document Revised: 05/22/2012 Document Reviewed: 03/22/2012 Elsevier Interactive Patient Education [employer] Inc.

## 2015-11-16 ENCOUNTER — Encounter (HOSPITAL_COMMUNITY): Payer: Self-pay | Admitting: Emergency Medicine

## 2015-11-16 ENCOUNTER — Emergency Department (HOSPITAL_COMMUNITY)
Admission: EM | Admit: 2015-11-16 | Discharge: 2015-11-16 | Discharge status: Against Medical Advice | Payer: No Typology Code available for payment source | Attending: Emergency Medicine | Admitting: Emergency Medicine

## 2015-11-16 DIAGNOSIS — R0602 Shortness of breath: Secondary | ICD-10-CM | POA: Insufficient documentation

## 2015-11-16 DIAGNOSIS — Z5321 Procedure and treatment not carried out due to patient leaving prior to being seen by health care provider: Secondary | ICD-10-CM | POA: Insufficient documentation

## 2015-11-16 DIAGNOSIS — R609 Edema, unspecified: Secondary | ICD-10-CM | POA: Insufficient documentation

## 2015-11-16 HISTORY — DX: Type 2 diabetes mellitus without complications: E11.9

## 2015-11-16 LAB — CBG MONITORING, ED: GLUCOSE-CAPILLARY: 167 mg/dL — AB (ref 65–99)

## 2015-11-16 NOTE — ED Notes (Signed)
Pt states she is retaining fluid all over not just in her feet and legs  Pt states she has numbness and tingling in her legs and feet  Pt states not long ago she weighed 302 and today here she weighs 337  Pt states she has had some shortness of breath on exertion

## 2015-11-16 NOTE — ED Notes (Signed)
Pt reported to registration that she was leaving

## 2015-11-24 ENCOUNTER — Emergency Department (HOSPITAL_COMMUNITY)
Admission: EM | Admit: 2015-11-24 | Discharge: 2015-11-24 | Disposition: A | Discharge status: Home / Self Care | Payer: Self-pay | Attending: Emergency Medicine | Admitting: Emergency Medicine

## 2015-11-24 ENCOUNTER — Ambulatory Visit (HOSPITAL_COMMUNITY)
Admission: EM | Admit: 2015-11-24 | Discharge: 2015-11-24 | Disposition: A | Discharge status: Nursing Home (Includes ICF) | Payer: No Typology Code available for payment source | Attending: Emergency Medicine | Admitting: Emergency Medicine

## 2015-11-24 ENCOUNTER — Encounter (HOSPITAL_COMMUNITY): Payer: Self-pay | Admitting: *Deleted

## 2015-11-24 ENCOUNTER — Emergency Department (HOSPITAL_COMMUNITY): Payer: Self-pay

## 2015-11-24 ENCOUNTER — Encounter (HOSPITAL_COMMUNITY): Payer: Self-pay | Admitting: Emergency Medicine

## 2015-11-24 DIAGNOSIS — F17210 Nicotine dependence, cigarettes, uncomplicated: Secondary | ICD-10-CM | POA: Insufficient documentation

## 2015-11-24 DIAGNOSIS — Z91040 Latex allergy status: Secondary | ICD-10-CM | POA: Insufficient documentation

## 2015-11-24 DIAGNOSIS — Z86718 Personal history of other venous thrombosis and embolism: Secondary | ICD-10-CM

## 2015-11-24 DIAGNOSIS — J45909 Unspecified asthma, uncomplicated: Secondary | ICD-10-CM | POA: Insufficient documentation

## 2015-11-24 DIAGNOSIS — M79661 Pain in right lower leg: Secondary | ICD-10-CM | POA: Insufficient documentation

## 2015-11-24 DIAGNOSIS — E119 Type 2 diabetes mellitus without complications: Secondary | ICD-10-CM | POA: Insufficient documentation

## 2015-11-24 DIAGNOSIS — M79604 Pain in right leg: Secondary | ICD-10-CM

## 2015-11-24 DIAGNOSIS — Z79899 Other long term (current) drug therapy: Secondary | ICD-10-CM | POA: Insufficient documentation

## 2015-11-24 DIAGNOSIS — Z7984 Long term (current) use of oral hypoglycemic drugs: Secondary | ICD-10-CM | POA: Insufficient documentation

## 2015-11-24 DIAGNOSIS — R600 Localized edema: Secondary | ICD-10-CM

## 2015-11-24 DIAGNOSIS — Z8742 Personal history of other diseases of the female genital tract: Secondary | ICD-10-CM | POA: Insufficient documentation

## 2015-11-24 DIAGNOSIS — E669 Obesity, unspecified: Secondary | ICD-10-CM | POA: Insufficient documentation

## 2015-11-24 LAB — POCT I-STAT, CHEM 8
BUN: 9 mg/dL (ref 6–20)
CREATININE: 0.7 mg/dL (ref 0.44–1.00)
Calcium, Ion: 1.27 mmol/L — ABNORMAL HIGH (ref 1.12–1.23)
Chloride: 104 mmol/L (ref 101–111)
Glucose, Bld: 91 mg/dL (ref 65–99)
HCT: 36 % (ref 36.0–46.0)
HEMOGLOBIN: 12.2 g/dL (ref 12.0–15.0)
POTASSIUM: 4.1 mmol/L (ref 3.5–5.1)
Sodium: 140 mmol/L (ref 135–145)
TCO2: 27 mmol/L (ref 0–100)

## 2015-11-24 LAB — BRAIN NATRIURETIC PEPTIDE: B Natriuretic Peptide: 19.7 pg/mL (ref 0.0–100.0)

## 2015-11-24 LAB — CBC WITH DIFFERENTIAL/PLATELET
BASOS ABS: 0 10*3/uL (ref 0.0–0.1)
BASOS PCT: 0 %
EOS ABS: 0.2 10*3/uL (ref 0.0–0.7)
EOS PCT: 2 %
HCT: 34.7 % — ABNORMAL LOW (ref 36.0–46.0)
Hemoglobin: 11.1 g/dL — ABNORMAL LOW (ref 12.0–15.0)
LYMPHS ABS: 2.7 10*3/uL (ref 0.7–4.0)
LYMPHS PCT: 31 %
MCH: 30.2 pg (ref 26.0–34.0)
MCHC: 32 g/dL (ref 30.0–36.0)
MCV: 94.3 fL (ref 78.0–100.0)
Monocytes Absolute: 0.8 10*3/uL (ref 0.1–1.0)
Monocytes Relative: 9 %
NEUTROS ABS: 5.1 10*3/uL (ref 1.7–7.7)
NEUTROS PCT: 58 %
PLATELETS: 369 10*3/uL (ref 150–400)
RBC: 3.68 MIL/uL — AB (ref 3.87–5.11)
RDW: 13.1 % (ref 11.5–15.5)
WBC: 8.8 10*3/uL (ref 4.0–10.5)

## 2015-11-24 NOTE — ED Provider Notes (Signed)
CSN: [id number]     Arrival date & time 11/24/15  1542 History   First MD Initiated Contact with Patient 11/24/15 1713     Chief Complaint  Patient presents with   Leg Swelling   (Consider location/radiation/quality/duration/timing/severity/associated sxs/prior Treatment) HPI Comments: 30 year old morbidly obese female presents to the urgent care with 3 weeks of lower extremity edema. The right lower extremity has had significantly more edema than the left. She has a history of lower extremity edema. And 2016 she had been traveling across the country and noticed edema and one of her lower extremities. When she arrived home she sought medical attention and was found to have a DVT. She states her right lower extremity feels similar to the time in which she had that previous DVT. There is pain in the right leg as well as tenderness along areas of the calf. 2+ pitting edema. The left lower extremity also has edema but less than the right.  Her second complaint is that of dyspnea, DOE and orthopnea which is relatively new. Denies chest pain, abdominal pain. She is no longer on Coumadin. She is prescribed metformin but has not checked her blood sugars recently. Blood sugar on i-STAT today is 91.     Past Medical History  Diagnosis Date   Abnormal Pap smear     Last pap smear normal    Breast disorder     lump in right breast   Edema of left lower extremity     Since age 55    History of blood clots     right leg   Asthma    Diabetes mellitus without complication [facility])    Past Surgical History  Procedure Laterality Date   Colposcopy  2009/2010   Bunionectomy      right foot   Family History  Problem Relation Age of Onset   Hypertension Mother    Asthma Mother    Pulmonary embolism Mother    Deep vein thrombosis Mother    Asthma Brother    Cancer Maternal Uncle     lung   Cancer Maternal Grandmother     brain   Cancer Maternal Grandfather     pancreatic    Social History  Substance Use Topics   Smoking status: Current Some Day Smoker    Types: Cigarettes   Smokeless tobacco: Never Used   Alcohol Use: Yes     Comment: socially   OB History    Gravida Para Term Preterm AB TAB SAB Ectopic Multiple Living   0     Review of Systems  Constitutional: Positive for activity change. Negative for fever and appetite change.  HENT: Negative for congestion and sore throat.   Eyes: Negative.   Respiratory: Positive for shortness of breath. Negative for chest tightness.        Orthopnea  Cardiovascular: Positive for leg swelling. Negative for chest pain.  Gastrointestinal: Negative.   Genitourinary: Negative.   Musculoskeletal: Negative for myalgias and back pain.  Skin: Negative for color change.  Neurological: Negative.     Allergies  Aspirin and Latex  Home Medications   Prior to Admission medications   Medication Sig Start Date End Date Taking? Authorizing Provider  acetaZOLAMIDE (DIAMOX) 250 MG tablet Take 250 mg by mouth 3 (three) times daily.   Yes Historical Provider, MD  furosemide (LASIX) 20 MG tablet Take 1 tablet (20 mg total) by mouth daily. Patient taking differently: Take 20  mg by mouth daily as needed for fluid.  09/05/14  Yes Dione Boozeavid Glick, MD  metFORMIN (GLUCOPHAGE) 500 MG tablet Take by mouth 2 (two) times daily with a meal.   Yes Historical Provider, MD  acetaminophen (TYLENOL) 325 MG tablet Take 650 mg by mouth every 6 (six) hours as needed for mild pain or headache.    Historical Provider, MD  omeprazole (PRILOSEC) 20 MG capsule Take 1 capsule (20 mg total) by mouth daily. 11/11/14   Azalia BilisKevin Campos, MD  oxyCODONE-acetaminophen (PERCOCET) 5-325 MG per tablet Take 1 tablet by mouth every 4 (four) hours as needed for moderate pain. Patient not taking: Reported on 11/11/2014 09/05/14   Dione Boozeavid Glick, MD  warfarin (COUMADIN) 5 MG tablet Take 5 mg by mouth daily.    Historical Provider, MD   Meds Ordered and  Administered this Visit  Medications - No data to display  BP 127/92 mmHg   Pulse 63   Temp(Src) 98.4 F (36.9 C) (Oral)   Resp 18   SpO2 100%   LMP 11/18/2015 (Exact Date) No data found.   Physical Exam  Constitutional: She is oriented to person, place, and time. She appears well-developed and well-nourished. No distress.  Neck: Normal range of motion. Neck supple.  Cardiovascular: Normal rate, regular rhythm and normal heart sounds.   Pulmonary/Chest: Effort normal. No respiratory distress.  Lungs clear to auscultation however body habitus limits sensitivity.  Musculoskeletal: She exhibits edema and tenderness.  Neurological: She is alert and oriented to person, place, and time. She exhibits normal muscle tone.  Skin: Skin is warm.  Psychiatric: She has a normal mood and affect.  Nursing note and vitals reviewed.   ED Course  Procedures (including critical care time)  Labs Review Labs Reviewed  POCT I-STAT, CHEM 8 - Abnormal; Notable for the following:    Calcium, Ion 1.27 (*)    All other components within normal limits    Imaging Review No results found.   Visual Acuity Review  Right Eye Distance:   Left Eye Distance:   Bilateral Distance:    Right Eye Near:   Left Eye Near:    Bilateral Near:         MDM   1. Bilateral edema of lower extremity   2. Right leg pain   3. History of DVT (deep vein thrombosis)   4. Type 2 diabetes mellitus without complication, without long-term current use of insulin (HCC)    Transfer to [place] via shuttle for evaluation of right lower extremity edema progressing over the past 3 weeks associated with pain. Shortness of breath and orthopnea. Risk factors include morbid obesity, history of DVT, type 2 diabetes mellitus, history of smoking and sedentary lifestyle.    Hayden Rasmussenavid Jacques Fife, NP 11/24/15 [id number]

## 2015-11-24 NOTE — ED Notes (Addendum)
Called Pt for reassessment at 2110, no response.

## 2015-11-24 NOTE — ED Notes (Addendum)
°

## 2015-11-24 NOTE — Discharge Instructions (Signed)
Please read and follow all provided instructions.  Your diagnoses today include:  1. Right leg pain    Tests performed today include:  Vital signs. See below for your results today.   Medications prescribed:   Take as prescribed   Home care instructions:  Follow any educational materials contained in this packet.  Follow-up instructions: Please follow-up tomorrow morning at the front desk of [facility] at 8am for an ultrasound of your right leg.    Return instructions:   Please return to the Emergency Department if you do not get better, if you get worse, or new symptoms OR  - Fever (temperature greater than 101.50F)  - Bleeding that does not stop with holding pressure to the area    -Severe pain (please note that you may be more sore the day after your accident)  - Chest Pain  - Difficulty breathing  - Severe nausea or vomiting  - Inability to tolerate food and liquids  - Passing out  - Skin becoming red around your wounds  - Change in mental status (confusion or lethargy)  - New numbness or weakness     Please return if you have any other emergent concerns.  Additional Information:  Your vital signs today were: BP 136/84 mmHg   Pulse 73   Temp(Src) 98.2 F (36.8 C)   Resp 30   Ht 5\' 8"  (1.727 m)   Wt 153.061 kg   BMI 51.32 kg/m2   SpO2 100%   LMP 11/18/2015 (Exact Date) If your blood pressure (BP) was elevated above 135/85 this visit, please have this repeated by your doctor within one month. ---------------

## 2015-11-24 NOTE — ED Notes (Signed)
Pt comes to desk asking "How many people are in front of me? Because I'm hungry." RN informs patient that she will be seen as soon as possible.

## 2015-11-24 NOTE — ED Notes (Signed)
The patient [facility] with a complaint of swollen feet and trouble breathing when she lays down. She stated that this has been ongoing for 3 weeks.

## 2015-11-24 NOTE — ED Provider Notes (Signed)
CSN: [id number]     Arrival date & time 11/24/15  1812 History   First MD Initiated Contact with Patient 11/24/15 2154     Chief Complaint  Patient presents with   Shortness of Breath   (Consider location/radiation/quality/duration/timing/severity/associated sxs/prior Treatment) HPI 30 y.o. female with a hx of DVT, DM, presents to the Emergency Department today complaining of lower extremity edema. Notes that she was at [facility] today and sent here for DVt evaluation. Noted swelling in BLE x 3 weeks with Right > Left. Notes pain on palpation of posterior calf on right side. None on left. Pain is 6/10 and aching sensation. Hx of DVT in 2016 likely due to travel at that time. No longer on Coumadin. No CP/ABD pain. No N/V/D. No fevers. No headaches. Pt states that she has SOB only when lying down. None on exertion. On lasix  QD. Tried taking "2 pills" Diamox today from friend. No other symptoms noted.    Past Medical History  Diagnosis Date   Abnormal Pap smear     Last pap smear normal    Breast disorder     lump in right breast   Edema of left lower extremity     Since age 46    History of blood clots     right leg   Asthma    Diabetes mellitus without complication [facility])    Past Surgical History  Procedure Laterality Date   Colposcopy  2009/2010   Bunionectomy      right foot   Family History  Problem Relation Age of Onset   Hypertension Mother    Asthma Mother    Pulmonary embolism Mother    Deep vein thrombosis Mother    Asthma Brother    Cancer Maternal Uncle     lung   Cancer Maternal Grandmother     brain   Cancer Maternal Grandfather     pancreatic   Social History  Substance Use Topics   Smoking status: Current Some Day Smoker    Types: Cigarettes   Smokeless tobacco: Never Used   Alcohol Use: Yes     Comment: socially   OB History    Gravida Para Term Preterm AB TAB SAB Ectopic Multiple Living   0     Review of Systems ROS  reviewed and all are negative for acute change except as noted in the HPI.  Allergies  Aspirin and Latex  Home Medications   Prior to Admission medications   Medication Sig Start Date End Date Taking? Authorizing Provider  acetaminophen (TYLENOL) 325 MG tablet Take 650 mg by mouth every 6 (six) hours as needed for mild pain or headache.   Yes Historical Provider, MD  acetaZOLAMIDE (DIAMOX) 250 MG tablet Take 250 mg by mouth 3 (three) times daily.   Yes Historical Provider, MD  furosemide (LASIX) 20 MG tablet Take 1 tablet (20 mg total) by mouth daily. Patient taking differently: Take 10 mg by mouth daily as needed for fluid.  09/05/14  Yes Dione Booze, MD  metFORMIN (GLUCOPHAGE) 500 MG tablet Take by mouth 2 (two) times daily with a meal.   Yes Historical Provider, MD  omeprazole (PRILOSEC) 20 MG capsule Take 1 capsule (20 mg total) by mouth daily. 11/11/14   Azalia Bilis, MD  oxyCODONE-acetaminophen (PERCOCET) 5-325 MG per tablet Take 1 tablet by mouth every 4 (four) hours as needed for moderate pain. Patient not taking: Reported on 11/11/2014 09/05/14  Dione Booze, MD   BP 134/87 mmHg   Pulse 79   Temp(Src) 98.2 F (36.8 C)   Resp 30   Ht 5\' 8"  (1.727 m)   Wt 153.061 kg   BMI 51.32 kg/m2   SpO2 100%   LMP 11/18/2015 (Exact Date)   Physical Exam  Constitutional: She is oriented to person, place, and time. She appears well-developed and well-nourished.  Obese   HENT:  Head: Normocephalic and atraumatic.  Eyes: EOM are normal. Pupils are equal, round, and reactive to light.  Neck: Normal range of motion. Neck supple. No tracheal deviation present.  Cardiovascular: Normal rate, regular rhythm, normal heart sounds and intact distal pulses.   No murmur heard. Pulmonary/Chest: Effort normal and breath sounds normal. No respiratory distress. She has no wheezes. She has no rales. She exhibits no tenderness.  Abdominal: Soft.  Musculoskeletal: Normal range of motion.  2+ pitting edema BLE.  Edema on Right > Left. Right Calf TTP. Positive Homans. Neurovascularly intact.   Neurological: She is alert and oriented to person, place, and time.  Skin: Skin is warm and dry.  Psychiatric: She has a normal mood and affect. Her behavior is normal. Thought content normal.  Nursing note and vitals reviewed.  ED Course  Procedures (including critical care time) Labs Review Labs Reviewed  CBC WITH DIFFERENTIAL/PLATELET - Abnormal; Notable for the following:    RBC 3.68 (*)    Hemoglobin 11.1 (*)    HCT 34.7 (*)    All other components within normal limits  BRAIN NATRIURETIC PEPTIDE  CBC WITH DIFFERENTIAL/PLATELET   Imaging Review Dg Chest 2 View  11/24/2015  CLINICAL DATA:  Shortness of breath for 3 weeks EXAM: CHEST  2 VIEW COMPARISON:  April 22, 2015 FINDINGS: Lungs are clear. Heart size and pulmonary vascularity are normal. No adenopathy. No bone lesions. IMPRESSION: No edema or consolidation. Electronically Signed   By: Bretta Bang III M.D.   On: 11/24/2015 19:16   I have personally reviewed and evaluated these images and lab results as part of my medical decision-making.   EKG Interpretation   Date/Time:  Wednesday November 24 2015 18:27:41 EDT Ventricular Rate:  74 PR Interval:  170 QRS Duration: 90 QT Interval:  404 QTC Calculation: 448 R Axis:   71 Text Interpretation:  Normal sinus rhythm Normal ECG When compared with  ECG of 09/05/2014, Nonspecific T wave abnormality is no longer Present  Confirmed by [facility]  MD, DAVID ([id number]) on 11/24/2015 6:35:26 PM      MDM  I have reviewed and evaluated the relevant laboratory valuesI have reviewed and evaluated the relevant imaging studies. I have interpreted the relevant EKG.I have reviewed the relevant previous healthcare records. I obtained HPI from historian. Patient discussed with supervising physician  ED Course:  Assessment: Pt is a 30yF with hx DVT who presents with BLE edema with R > L. Sent from [facility] for DVT eval. On  exam, pt in NAD. Nontoxic/nonseptic appearing. VSS. No tachycardia. No hypoxia. Afebrile. Lungs CTA. Heart RRR. Abdomen nontender soft. Right calf TTP. Pos Homans. 2+ pitting edema bilaterally. Labs unremarkable. BNP unremarkable. CXR unremarkable. Low suspicion for PE based on exam. LE [place] ordered for tomorrow morning at 8am due to unavailability tonight. Pt agreeable to plan. Plan is to DC home. At time of discharge, Patient is in no acute distress. Vital Signs are stable. Patient is able to ambulate. Patient able to tolerate PO.    Disposition/Plan:  DC Home Additional Verbal discharge  instructions given and discussed with patient.  Pt Instructed to receive DVT Ultrasound tomorrow morning at 8am  Return precautions given Pt acknowledges and agrees with plan  Supervising Physician Dione Boozeavid Glick, MD   Final diagnoses:  Right leg pain      Audry Piliyler Len Kluver, PA-C 11/24/15 2257  Dione Boozeavid Glick, MD 11/24/15 [phone]

## 2015-11-24 NOTE — ED Notes (Signed)
The pt [facility] with leg pain and some sob at night for 3 weeks  Most of her pain is in her feet and lower legs.  [facility]

## 2015-11-25 ENCOUNTER — Ambulatory Visit (HOSPITAL_COMMUNITY)
Admission: RE | Admit: 2015-11-25 | Discharge: 2015-11-25 | Disposition: A | Discharge status: Home / Self Care | Payer: Self-pay | Source: Ambulatory Visit | Attending: Physician Assistant | Admitting: Physician Assistant

## 2015-11-25 DIAGNOSIS — R600 Localized edema: Secondary | ICD-10-CM | POA: Insufficient documentation

## 2015-11-25 DIAGNOSIS — M79609 Pain in unspecified limb: Secondary | ICD-10-CM

## 2015-11-25 DIAGNOSIS — M7989 Other specified soft tissue disorders: Secondary | ICD-10-CM

## 2015-11-25 DIAGNOSIS — M79604 Pain in right leg: Secondary | ICD-10-CM | POA: Insufficient documentation

## 2015-11-25 DIAGNOSIS — Z86718 Personal history of other venous thrombosis and embolism: Secondary | ICD-10-CM | POA: Insufficient documentation

## 2015-11-25 NOTE — Progress Notes (Signed)
VASCULAR LAB PRELIMINARY  PRELIMINARY  PRELIMINARY  PRELIMINARY  Right lower extremity venous duplex completed.    Preliminary report:  Right:  No evidence of DVT, superficial thrombosis, or Baker's cyst.  Darvin Dials, RVS 11/25/2015, 10:12 AM

## 2016-09-12 ENCOUNTER — Institutional Professional Consult (permissible substitution): Payer: Medicaid - Out of State | Admitting: Pulmonary Disease

## 2016-10-26 ENCOUNTER — Institutional Professional Consult (permissible substitution): Payer: Medicaid - Out of State | Admitting: Internal Medicine

## 2016-12-18 ENCOUNTER — Encounter (HOSPITAL_BASED_OUTPATIENT_CLINIC_OR_DEPARTMENT_OTHER): Payer: Self-pay | Admitting: *Deleted

## 2016-12-18 ENCOUNTER — Emergency Department (HOSPITAL_BASED_OUTPATIENT_CLINIC_OR_DEPARTMENT_OTHER)
Admission: EM | Admit: 2016-12-18 | Discharge: 2016-12-19 | Disposition: A | Discharge status: Home / Self Care | Payer: BLUE CROSS/BLUE SHIELD | Attending: Emergency Medicine | Admitting: Emergency Medicine

## 2016-12-18 ENCOUNTER — Emergency Department (HOSPITAL_BASED_OUTPATIENT_CLINIC_OR_DEPARTMENT_OTHER): Payer: BLUE CROSS/BLUE SHIELD

## 2016-12-18 DIAGNOSIS — I82A11 Acute embolism and thrombosis of right axillary vein: Secondary | ICD-10-CM | POA: Insufficient documentation

## 2016-12-18 DIAGNOSIS — F17210 Nicotine dependence, cigarettes, uncomplicated: Secondary | ICD-10-CM | POA: Diagnosis not present

## 2016-12-18 DIAGNOSIS — Z7984 Long term (current) use of oral hypoglycemic drugs: Secondary | ICD-10-CM | POA: Insufficient documentation

## 2016-12-18 DIAGNOSIS — Z91040 Latex allergy status: Secondary | ICD-10-CM | POA: Insufficient documentation

## 2016-12-18 DIAGNOSIS — R609 Edema, unspecified: Secondary | ICD-10-CM

## 2016-12-18 DIAGNOSIS — J45909 Unspecified asthma, uncomplicated: Secondary | ICD-10-CM | POA: Diagnosis not present

## 2016-12-18 DIAGNOSIS — E119 Type 2 diabetes mellitus without complications: Secondary | ICD-10-CM | POA: Diagnosis not present

## 2016-12-18 DIAGNOSIS — M79601 Pain in right arm: Secondary | ICD-10-CM | POA: Diagnosis present

## 2016-12-18 DIAGNOSIS — Z79899 Other long term (current) drug therapy: Secondary | ICD-10-CM | POA: Diagnosis not present

## 2016-12-18 LAB — PLATELET COUNT: Platelets: 313 10*3/uL (ref 150–400)

## 2016-12-18 LAB — HEMOGLOBIN AND HEMATOCRIT, BLOOD
HCT: 35.5 % — ABNORMAL LOW (ref 36.0–46.0)
Hemoglobin: 11.9 g/dL — ABNORMAL LOW (ref 12.0–15.0)

## 2016-12-18 LAB — HCG, QUANTITATIVE, PREGNANCY: hCG, Beta Chain, Quant, S: <1 m[IU]/mL (ref <5)

## 2016-12-18 LAB — PROTIME-INR
INR: 1.01
PROTHROMBIN TIME: 13.3 s (ref 11.4–15.2)

## 2016-12-18 LAB — APTT: APTT: 34 s (ref 24–36)

## 2016-12-18 MED ORDER — RIVAROXABAN 15 MG PO TABS
15.0000 mg | ORAL_TABLET | Freq: Once | ORAL | Status: AC
  Administered 2016-12-18: 15 mg via ORAL
  Filled 2016-12-18: qty 1

## 2016-12-18 MED ORDER — RIVAROXABAN (XARELTO) EDUCATION KIT FOR DVT/PE PATIENTS
PACK | Freq: Once | Status: AC
  Administered 2016-12-18

## 2016-12-18 NOTE — ED Notes (Signed)
Patient transported to Ultrasound °

## 2016-12-18 NOTE — ED Provider Notes (Signed)
[facility] Provider Note   CSN: [id number] Arrival date & time: 12/18/16  1718  By signing my name below, I, Cassandra White, attest that this documentation has been prepared under the direction and in the presence of Cassandra Captain, PA-C. Electronically Signed: Linna White, Scribe. 12/18/2016. 8:36 PM.  History   Chief Complaint Chief Complaint  Patient presents with   Arm Pain   The history is provided by the patient. No language interpreter was used.    HPI Comments: Cassandra White is a 31 y.o. female with PMHx including DM and blood clots who presents to the Emergency Department complaining of constant pain and swelling to the right forearm beginning six days ago. Patient states her pain is most significant in the right wrist and notes the swelling migrates proximally along the medial aspect of the forearm. Her pain is worse with applied pressure. She has a h/o DVT in her bilateral lower extremities as well as a pulmonary embolus and is concerned for a potential DVT in the right arm. Also of note, patient had a recent EMG and was diagnosed with meralgia paresthetica. She denies similar symptoms to her left arm, numbness/tingling, chest pain, and dyspnea.  Past Medical History:  Diagnosis Date   Abnormal Pap smear    Last pap smear normal    Asthma    Breast disorder    lump in right breast   Diabetes mellitus without complication (HCC)    Edema of left lower extremity    Since age 48    History of blood clots    right leg    Patient Active Problem List   Diagnosis Date Noted   Possible exposure to STD 12/12/2011   Fibrocystic breast changes    LEG EDEMA, LEFT 09/14/2006   OBESITY, NOS 08/16/2006    Past Surgical History:  Procedure Laterality Date   BUNIONECTOMY     right foot   COLPOSCOPY  2009/2010    OB History    Gravida Para Term Preterm AB Living   1       1 0   SAB TAB Ectopic Multiple Live Births   1               Home  Medications    Prior to Admission medications   Medication Sig Start Date End Date Taking? Authorizing Provider  acetaminophen (TYLENOL) 325 MG tablet Take 650 mg by mouth every 6 (six) hours as needed for mild pain or headache.   Yes [provider]  GABAPENTIN PO Take by mouth.   Yes [provider]  Vitamin D, Ergocalciferol, (DRISDOL) 50000 units CAPS capsule Take 50,000 Units by mouth every 7 (seven) days.   Yes [provider]  acetaZOLAMIDE (DIAMOX) 250 MG tablet Take 250 mg by mouth 3 (three) times daily.    [provider]  furosemide (LASIX) 20 MG tablet Take 1 tablet (20 mg total) by mouth daily. Patient taking differently: Take 10 mg by mouth daily as needed for fluid.  09/05/14   Dione Booze, MD  metFORMIN (GLUCOPHAGE) 500 MG tablet Take by mouth 2 (two) times daily with a meal.    [provider]  omeprazole (PRILOSEC) 20 MG capsule Take 1 capsule (20 mg total) by mouth daily. 11/11/14   Azalia Bilis, MD  oxyCODONE-acetaminophen (PERCOCET) 5-325 MG per tablet Take 1 tablet by mouth every 4 (four) hours as needed for moderate pain. Patient not taking: Reported on 11/11/2014 09/05/14   Dione Booze, MD  Family History Family History  Problem Relation Age of Onset   Hypertension Mother    Asthma Mother    Pulmonary embolism Mother    Deep vein thrombosis Mother    Asthma Brother    Cancer Maternal Uncle        lung   Cancer Maternal Grandmother        brain   Cancer Maternal Grandfather        pancreatic    Social History Social History  Substance Use Topics   Smoking status: Current Some Day Smoker    Types: Cigarettes   Smokeless tobacco: Never Used   Alcohol use Yes     Comment: socially     Allergies   Aspirin and Latex   Review of Systems Review of Systems  Respiratory: Negative for shortness of breath.   Cardiovascular: Negative for chest pain.  Musculoskeletal: Positive for arthralgias and  joint swelling.  Neurological: Negative for numbness.   Physical Exam Updated Vital Signs BP 110/68 (BP Location: Left Arm)    Pulse 68    Temp 98.5 F (36.9 C) (Oral)    Resp 20    Ht 5\' 8"  (1.727 m)    Wt (!) 352 lb (159.7 kg)    LMP 10/24/2016    SpO2 99%    BMI 53.52 kg/m   Physical Exam  Constitutional: She is oriented to person, place, and time. She appears well-developed and well-nourished. No distress.  HENT:  Head: Normocephalic and atraumatic.  Eyes: Conjunctivae and EOM are normal.  Neck: Neck supple. No tracheal deviation present.  Cardiovascular: Normal rate.   Pulses:      Radial pulses are 2+ on the right side.  Pulmonary/Chest: Effort normal. No respiratory distress.  Musculoskeletal: Normal range of motion.  Exquisite tenderness to the medial aspect of the right forearm. Notable swelling to the right forearm. Exam limited by body habitus.  Neurological: She is alert and oriented to person, place, and time.  Normal strength and sensation in the RUE.  Skin: Skin is warm and dry.  Psychiatric: She has a normal mood and affect. Her behavior is normal.  Nursing note and vitals reviewed.  ED Treatments / Results  Labs (all labs ordered are listed, but only abnormal results are displayed) Labs Reviewed - No data to display  EKG  EKG Interpretation None       Radiology No results found.  Procedures Procedures (including critical care time)  DIAGNOSTIC STUDIES: Oxygen Saturation is 100% on RA, normal by my interpretation.    COORDINATION OF CARE: 8:33 PM Discussed treatment plan with pt at bedside and pt agreed to plan.  Medications Ordered in ED Medications - No data to display   Initial Impression / Assessment and Plan / ED Course  I have reviewed the triage vital signs and the nursing notes.  Pertinent labs & imaging results that were available during my care of the patient were reviewed by me and considered in my medical decision making (see chart  for details).      Patient with new RUE DVT. I have sent a coag panel We are unable to obtain part of the panel at [facility]. Negative preg. No cp/SOB . Plast documented 02 is inacurate. Patient started on xarelto starter pack. Given hematology and PCP follow up. Discussed return precautions.  Final Clinical Impressions(s) / ED Diagnoses   Final diagnoses:  Acute deep vein thrombosis (DVT) of axillary vein of right upper extremity (HCC)    New  Prescriptions New Prescriptions   No medications on file   I personally performed the services described in this documentation, which was scribed in my presence. The recorded information has been reviewed and is accurate.   I personally performed the services described in this documentation, which was scribed in my presence. The recorded information has been reviewed and is accurate.      Cassandra CaptainHarris, Tuesday Terlecki, PA-C 12/19/16 1552    Little, Ambrose Finlandachel Morgan, MD 12/19/16 [phone]

## 2016-12-18 NOTE — ED Notes (Signed)
ED Provider at bedside. °

## 2016-12-18 NOTE — ED Triage Notes (Signed)
Right arm pain and swelling. Her menses is 2 months late. She is not sexually active with men. She has no appetite but continues to gain weight.

## 2016-12-19 LAB — FIBRINOGEN: Fibrinogen: 353 mg/dL (ref 210–475)

## 2016-12-19 MED ORDER — RIVAROXABAN (XARELTO) VTE STARTER PACK (15 & 20 MG): 15.0000 mg | ORAL_TABLET | Freq: Two times a day (BID) | ORAL | 0 refills | Status: DC

## 2016-12-19 NOTE — Discharge Instructions (Signed)
Get help right away if:  You have new or increased pain, swelling, or redness in an arm or leg.  You have numbness or tingling in an arm or leg.  You have shortness of breath while active or at rest.  You have chest pain.  You have a rapid or irregular heartbeat.  You feel light-headed or dizzy.  You cough up blood.  You notice blood in your vomit, bowel movement, or urine.

## 2016-12-19 NOTE — Progress Notes (Addendum)
[facility] [address], [address] [place], [place] [address] [phone]  Date:  12/21/2016   Name:  Cassandra White   DOB:  10-Jul-1985   MRN:  [id number]  PCP:  Pearline Cables, MD    Chief Complaint: Establish Care (Pt here to est care. Irregular cycle past 2-3 months. Seen in ER on 12/18/16. )   History of Present Illness:  Cassandra White is a 31 y.o. very pleasant female patient who presents with the following:  Here today as a new patient She was seen in the ER on 7/2 and was dx with an UE DVT- started on xarelto for treatment ER A/P Patient with new RUE DVT. I have sent a coag panel We are unable to obtain part of the panel at [facility]. Negative preg. No cp/SOB . Plast documented 02 is inacurate. Patient started on xarelto starter pack. Given hematology and PCP follow up. Discussed return precautions.  She has had DVT twice in her legs in the past as well She has a referral to hematology pending Her arm is less swollen but still painful.  She is taking her xarelto without difficulty   Her mother did have a PE, and her MGM had a clot as well. They have never been dx with a familiar clotting disorder but [place] understands that this may be the case   She has also had problems with irregular bleeding recently.  Her last regular menses were in April of this year- she has generally had regular monthly bleeding.  Finally yesterday she had a little bit of bleeding but this went away in just a few hours  No urinary symptoms She is not concerned that she might be pregnant as her partner is a female  No thyroid level recently  Her neurologist is Dr.Onasanya with [facility]. I am not quite clear on why she is seeing him, but they did do some nerve conduction studies recently.  She is not sure of the results She was taking metformin in the past but stopped using this due to stomach upset  Wt Readings from Last 3  Encounters:  12/21/16 (!) 354 lb 3.2 oz (160.7 kg)  12/18/16 (!) 352 lb (159.7 kg)  11/24/15 (!) 337 lb 7 oz (153.1 kg)    In 2013 she was just over 300 lbs  Patient Active Problem List   Diagnosis Date Noted  . Possible exposure to STD 12/12/2011  . Fibrocystic breast changes   . LEG EDEMA, LEFT 09/14/2006  . OBESITY, NOS 08/16/2006    Past Medical History:  Diagnosis Date  . Abnormal Pap smear    Last pap smear normal   . Asthma   . Breast disorder    lump in right breast  . Diabetes mellitus without complication (HCC)   . Edema of left lower extremity    Since age 36   . History of blood clots    right leg    Past Surgical History:  Procedure Laterality Date  . BUNIONECTOMY     right foot  . COLPOSCOPY  2009/2010    Social History  Substance Use Topics  . Smoking status: Current Some Day Smoker    Types: Cigarettes  . Smokeless tobacco: Never Used  . Alcohol use Yes     Comment: socially    Family History  Problem Relation Age of Onset  . Hypertension Mother   . Asthma Mother   .  Pulmonary embolism Mother   . Deep vein thrombosis Mother   . Asthma Brother   . Cancer Maternal Uncle        lung  . Cancer Maternal Grandmother        brain  . Cancer Maternal Grandfather        pancreatic    Allergies  Allergen Reactions  . Aspirin Other (See Comments)    Can not take due to ulcer  . Latex Hives and Swelling    Medication list has been reviewed and updated.  Current Outpatient Prescriptions on File Prior to Visit  Medication Sig Dispense Refill  . GABAPENTIN PO Take by mouth.    . Rivaroxaban 15 & 20 MG TBPK Take 15 mg by mouth 2 (two) times daily. Take as directed on package: Start with one 15mg  tablet by mouth twice a day with food. On Day 22, switch to one 20mg  tablet once a day with food. 51 each 0  . Vitamin D, Ergocalciferol, (DRISDOL) 50000 units CAPS capsule Take 50,000 Units by mouth every 7 (seven) days.    Marland Kitchen acetaminophen (TYLENOL)  325 MG tablet Take 650 mg by mouth every 6 (six) hours as needed for mild pain or headache.    Marland Kitchen acetaZOLAMIDE (DIAMOX) 250 MG tablet Take 250 mg by mouth 3 (three) times daily.    . furosemide (LASIX) 20 MG tablet Take 1 tablet (20 mg total) by mouth daily. (Patient not taking: Reported on 12/21/2016) 15 tablet 0  . metFORMIN (GLUCOPHAGE) 500 MG tablet Take by mouth 2 (two) times daily with a meal.    . omeprazole (PRILOSEC) 20 MG capsule Take 1 capsule (20 mg total) by mouth daily. (Patient not taking: Reported on 12/21/2016) 30 capsule 0  . oxyCODONE-acetaminophen (PERCOCET) 5-325 MG per tablet Take 1 tablet by mouth every 4 (four) hours as needed for moderate pain. (Patient not taking: Reported on 12/21/2016) 20 tablet 0   No current facility-administered medications on file prior to visit.     Review of Systems:  As per HPI- otherwise negative. No fever, chills, nausea, vomiting, diarrhea, CP or SOB  Physical Examination: Vitals:   12/21/16 1614 12/21/16 1636  BP: (!) 154/90 (!) 146/78  Pulse: 86   Temp: 98.6 F (37 C)    Vitals:   12/21/16 1614  Weight: (!) 354 lb 3.2 oz (160.7 kg)  Height: 5\' 8"  (1.727 m)   Body mass index is 53.86 kg/m. Ideal Body Weight: Weight in (lb) to have BMI = 25: 164.1  GEN: WDWN, NAD, Non-toxic, A & O x 3, very obese with BMI over 50 HEENT: Atraumatic, Normocephalic. Neck supple. No masses, No LAD. Ears and Nose: No external deformity. CV: RRR, No M/G/R. No JVD. No thrill. No extra heart sounds. PULM: CTA B, no wheezes, crackles, rhonchi. No retractions. No resp. distress. No accessory muscle use. EXTR: No c/c/e NEURO Normal gait.  PSYCH: Normally interactive. Conversant. Not depressed or anxious appearing.  Calm demeanor.  Mild swelling still present in her right forearm- pt reports that this is much improved however  Results for orders placed or performed in visit on 12/21/16  POCT urine pregnancy  Result Value Ref Range   Preg Test, Ur  Negative Negative    Assessment and Plan: Irregular menstrual cycle - Plan: POCT urine pregnancy, TSH, Hemoglobin A1c  History of DVT (deep vein thrombosis) - Plan: Comprehensive metabolic panel, CBC  Morbid obesity (HCC) - Plan: Hemoglobin A1c  History of multiple DVT- here  today with concern of lack on menses.  She is not pregnant.  Suspect that she has PCOS/ anovulation due to obesity.  Discussed with pt and she states understanding  If all labs are normal she may want to try 10 days of provera to try and induce a bleed  Also will run other labs as above Will plan further follow- up pending labs.   Signed Abbe Amsterdam, MD  Received her labs.  Called and LMOM- will try her back later to make a plan  Called and discussed with pt on 7/10- she has been bleeding off an on for the last few days. She also met with a bariatric surgeon yesterday and is thinking about surgery.   Since she cannot use COCs, would like to get her back on metformin for presumed anovulation/ PCOS.  She has used this in the past and did ok.  Will start with 500 mg daily and increase to BID.   Letter to pt with labs   Results for orders placed or performed in visit on 12/21/16  TSH  Result Value Ref Range   TSH 0.95 0.35 - 4.50 uIU/mL  Comprehensive metabolic panel  Result Value Ref Range   Sodium 138 135 - 145 mEq/L   Potassium 3.7 3.5 - 5.1 mEq/L   Chloride 104 96 - 112 mEq/L   CO2 27 19 - 32 mEq/L   Glucose, Bld 103 (H) 70 - 99 mg/dL   BUN 10 6 - 23 mg/dL   Creatinine, Ser 7.82 0.40 - 1.20 mg/dL   Total Bilirubin 0.4 0.2 - 1.2 mg/dL   Alkaline Phosphatase 44 39 - 117 U/L   AST 16 0 - 37 U/L   ALT 10 0 - 35 U/L   Total Protein 7.5 6.0 - 8.3 g/dL   Albumin 4.0 3.5 - 5.2 g/dL   Calcium 9.8 8.4 - 95.6 mg/dL   GFR 213.08 >65.78 mL/min  CBC  Result Value Ref Range   WBC 9.1 4.0 - 10.5 K/uL   RBC 3.75 (L) 3.87 - 5.11 Mil/uL   Platelets 359.0 150.0 - 400.0 K/uL   Hemoglobin 11.4 (L) 12.0 - 15.0 g/dL    HCT 46.9 (L) 62.9 - 46.0 %   MCV 91.9 78.0 - 100.0 fl   MCHC 32.9 30.0 - 36.0 g/dL   RDW 52.8 41.3 - 24.4 %  Hemoglobin A1c  Result Value Ref Range   Hgb A1c MFr Bld 5.1 4.6 - 6.5 %  POCT urine pregnancy  Result Value Ref Range   Preg Test, Ur Negative Negative    Hg was 11.5 in 2012, 11.1 in 2016- stable

## 2016-12-21 ENCOUNTER — Ambulatory Visit (INDEPENDENT_AMBULATORY_CARE_PROVIDER_SITE_OTHER): Payer: BLUE CROSS/BLUE SHIELD | Admitting: Family Medicine

## 2016-12-21 ENCOUNTER — Other Ambulatory Visit: Payer: Self-pay | Admitting: Oncology

## 2016-12-21 ENCOUNTER — Encounter: Payer: Self-pay | Admitting: Family Medicine

## 2016-12-21 VITALS — BP 146/78 | HR 86 | Temp 98.6°F | Ht 68.0 in | Wt 354.2 lb

## 2016-12-21 DIAGNOSIS — N926 Irregular menstruation, unspecified: Secondary | ICD-10-CM | POA: Diagnosis not present

## 2016-12-21 DIAGNOSIS — Z86718 Personal history of other venous thrombosis and embolism: Secondary | ICD-10-CM

## 2016-12-21 LAB — POCT URINE PREGNANCY: PREG TEST UR: NEGATIVE

## 2016-12-21 MED FILL — XARELTO STARTER PACK: 15 & 20 | 30 days supply | Qty: 51 | Fill #0

## 2016-12-21 NOTE — Patient Instructions (Signed)
It was good to see you today- I'll be in touch with your labs asap If your labs look ok we can try 10 days of provera to try and induce menstrual bleeding.

## 2016-12-22 ENCOUNTER — Other Ambulatory Visit: Payer: Self-pay | Admitting: Oncology

## 2016-12-22 LAB — COMPREHENSIVE METABOLIC PANEL
ALBUMIN: 4 g/dL (ref 3.5–5.2)
ALK PHOS: 44 U/L (ref 39–117)
ALT: 10 U/L (ref 0–35)
AST: 16 U/L (ref 0–37)
BILIRUBIN TOTAL: 0.4 mg/dL (ref 0.2–1.2)
BUN: 10 mg/dL (ref 6–23)
CALCIUM: 9.8 mg/dL (ref 8.4–10.5)
CO2: 27 mEq/L (ref 19–32)
CREATININE: 0.77 mg/dL (ref 0.40–1.20)
Chloride: 104 mEq/L (ref 96–112)
GFR: 113.78 mL/min (ref >60.00)
Glucose, Bld: 103 mg/dL — ABNORMAL HIGH (ref 70–99)
Potassium: 3.7 mEq/L (ref 3.5–5.1)
Sodium: 138 mEq/L (ref 135–145)
TOTAL PROTEIN: 7.5 g/dL (ref 6.0–8.3)

## 2016-12-22 LAB — CBC
HCT: 34.5 % — ABNORMAL LOW (ref 36.0–46.0)
Hemoglobin: 11.4 g/dL — ABNORMAL LOW (ref 12.0–15.0)
MCHC: 32.9 g/dL (ref 30.0–36.0)
MCV: 91.9 fl (ref 78.0–100.0)
Platelets: 359 10*3/uL (ref 150.0–400.0)
RBC: 3.75 Mil/uL — AB (ref 3.87–5.11)
RDW: 13.4 % (ref 11.5–15.5)
WBC: 9.1 10*3/uL (ref 4.0–10.5)

## 2016-12-22 LAB — TSH: TSH: 0.95 u[IU]/mL (ref 0.35–4.50)

## 2016-12-22 LAB — HEMOGLOBIN A1C: HEMOGLOBIN A1C: 5.1 % (ref 4.6–6.5)

## 2016-12-25 ENCOUNTER — Telehealth: Payer: Self-pay | Admitting: Oncology

## 2016-12-25 ENCOUNTER — Ambulatory Visit: Payer: BLUE CROSS/BLUE SHIELD | Admitting: Medical

## 2016-12-25 NOTE — Telephone Encounter (Signed)
Appt has been scheduled for the pt to see Dr. Darnelle CatalanMagrinat on 7/13 at 330pm. Pt agreed to appt date and time. Aware to arrive 30 minutes early. Location given.

## 2016-12-26 ENCOUNTER — Encounter: Payer: Self-pay | Admitting: Family Medicine

## 2016-12-26 MED ORDER — METFORMIN HCL 500 MG PO TABS: 500.0000 mg | ORAL_TABLET | Freq: Two times a day (BID) | ORAL | 5 refills | Status: DC

## 2016-12-26 NOTE — Addendum Note (Signed)
Addended by: Abbe AmsterdamOPLAND, Liany Mumpower C on: 12/26/2016 02:38 PM   Modules accepted: Orders

## 2016-12-29 ENCOUNTER — Ambulatory Visit: Payer: BLUE CROSS/BLUE SHIELD | Admitting: Oncology

## 2016-12-29 ENCOUNTER — Encounter: Payer: Self-pay | Admitting: Oncology

## 2016-12-29 DIAGNOSIS — I82621 Acute embolism and thrombosis of deep veins of right upper extremity: Secondary | ICD-10-CM | POA: Insufficient documentation

## 2016-12-29 NOTE — Progress Notes (Signed)
[facility]  Telephone:(336) [phone] [phone]) [phone]   Patient did not show for her 12/29/2016 visit. Letter sent  ID: Cassandra White DOB: 07/12/1985  MR#: [id number]  GMW#:[id number]#:[id number]  Patient Care Team: Pearline Cablesopland, Jessica C, MD as PCP - General (Family Medicine) Lowella DellMAGRINAT,Mekhi Lascola C, MD OTHER MD:  CHIEF COMPLAINT:   CURRENT TREATMENT:    HISTORY OF PRESENT ILLNESS:   The patient's subsequent history is as detailed below.  INTERVAL HISTORY:   REVIEW OF SYSTEMS:  PAST MEDICAL HISTORY: Past Medical History:  Diagnosis Date   Abnormal Pap smear    Last pap smear normal    Asthma    Breast disorder    lump in right breast   Diabetes mellitus without complication (HCC)    Edema of left lower extremity    Since age 31    History of blood clots    right leg    PAST SURGICAL HISTORY: Past Surgical History:  Procedure Laterality Date   BUNIONECTOMY     right foot   COLPOSCOPY  2009/2010    FAMILY HISTORY Family History  Problem Relation Age of Onset   Hypertension Mother    Asthma Mother    Pulmonary embolism Mother    Deep vein thrombosis Mother    Asthma Brother    Cancer Maternal Uncle        lung   Cancer Maternal Grandmother        brain   Cancer Maternal Grandfather        pancreatic    GYNECOLOGIC HISTORY:  No LMP recorded.   SOCIAL HISTORY:      ADVANCED DIRECTIVES:    HEALTH MAINTENANCE: Social History  Substance Use Topics   Smoking status: Current Some Day Smoker    Types: Cigarettes   Smokeless tobacco: Never Used   Alcohol use Yes     Comment: socially     Colonoscopy:  PAP:  Bone density:   Allergies  Allergen Reactions   Aspirin Other (See Comments)    Can not take due to ulcer   Latex Hives and Swelling    Current Outpatient Prescriptions  Medication Sig Dispense Refill   acetaminophen (TYLENOL) 325 MG tablet Take 650 mg by mouth every 6 (six) hours as needed for mild pain or  headache.     acetaZOLAMIDE (DIAMOX) 250 MG tablet Take 250 mg by mouth 3 (three) times daily.     furosemide (LASIX) 20 MG tablet Take 1 tablet (20 mg total) by mouth daily. (Patient not taking: Reported on 12/21/2016) 15 tablet 0   GABAPENTIN PO Take by mouth.     metFORMIN (GLUCOPHAGE) 500 MG tablet Take 1 tablet (500 mg total) by mouth 2 (two) times daily with a meal. Start with one pill a day for 2 weeks 60 tablet 5   omeprazole (PRILOSEC) 20 MG capsule Take 1 capsule (20 mg total) by mouth daily. (Patient not taking: Reported on 12/21/2016) 30 capsule 0   oxyCODONE-acetaminophen (PERCOCET) 5-325 MG per tablet Take 1 tablet by mouth every 4 (four) hours as needed for moderate pain. (Patient not taking: Reported on 12/21/2016) 20 tablet 0   Rivaroxaban 15 & 20 MG TBPK Take 15 mg by mouth 2 (two) times daily. Take as directed on package: Start with one 15mg  tablet by mouth twice a day with food. On Day 22, switch to one 20mg  tablet once a day with food. 51 each 0   Vitamin D, Ergocalciferol, (DRISDOL) 50000 units CAPS capsule Take 50,000  Units by mouth every 7 (seven) days.     No current facility-administered medications for this visit.     OBJECTIVE:  There were no vitals filed for this visit.   There is no height or weight on file to calculate BMI.      Ocular: Sclerae unicteric, pupils round and equal Ear-nose-throat: Oropharynx clear and moist Lymphatic: No cervical or supraclavicular adenopathy Lungs no rales or rhonchi Heart regular rate and rhythm Abd soft, nontender, positive bowel sounds MSK no focal spinal tenderness, no joint edema Neuro: non-focal, well-oriented, appropriate affect Breasts:    LAB RESULTS:  CMP     Component Value Date/Time   NA 138 12/21/2016 1655   K 3.7 12/21/2016 1655   CL 104 12/21/2016 1655   CO2 27 12/21/2016 1655   GLUCOSE 103 (H) 12/21/2016 1655   BUN 10 12/21/2016 1655   CREATININE 0.77 12/21/2016 1655   CREATININE 0.73 12/12/2011  1448   CALCIUM 9.8 12/21/2016 1655   PROT 7.5 12/21/2016 1655   ALBUMIN 4.0 12/21/2016 1655   AST 16 12/21/2016 1655   ALT 10 12/21/2016 1655   ALKPHOS 44 12/21/2016 1655   BILITOT 0.4 12/21/2016 1655   GFRNONAA >60 04/22/2015 1954   GFRAA >60 04/22/2015 1954    No results found for: TOTALPROTELP, ALBUMINELP, A1GS, A2GS, BETS, BETA2SER, GAMS, MSPIKE, SPEI  No results found for: Ron Parker, [facility]  Lab Results  Component Value Date   WBC 9.1 12/21/2016   NEUTROABS 5.1 11/24/2015   HGB 11.4 (L) 12/21/2016   HCT 34.5 (L) 12/21/2016   MCV 91.9 12/21/2016   PLT 359.0 12/21/2016      Chemistry      Component Value Date/Time   NA 138 12/21/2016 1655   K 3.7 12/21/2016 1655   CL 104 12/21/2016 1655   CO2 27 12/21/2016 1655   BUN 10 12/21/2016 1655   CREATININE 0.77 12/21/2016 1655   CREATININE 0.73 12/12/2011 1448      Component Value Date/Time   CALCIUM 9.8 12/21/2016 1655   ALKPHOS 44 12/21/2016 1655   AST 16 12/21/2016 1655   ALT 10 12/21/2016 1655   BILITOT 0.4 12/21/2016 1655       No results found for: LABCA2  No components found for: [id number]  No results for input(s): INR in the last 168 hours.  Urinalysis    Component Value Date/Time   COLORURINE YELLOW 04/22/2015 2105   APPEARANCEUR CLOUDY (A) 04/22/2015 2105   LABSPEC 1.024 04/22/2015 2105   PHURINE 6.5 04/22/2015 2105   GLUCOSEU NEGATIVE 04/22/2015 2105   HGBUR NEGATIVE 04/22/2015 2105   BILIRUBINUR NEGATIVE 04/22/2015 2105   KETONESUR NEGATIVE 04/22/2015 2105   PROTEINUR NEGATIVE 04/22/2015 2105   UROBILINOGEN 1.0 04/22/2015 2105   NITRITE NEGATIVE 04/22/2015 2105   LEUKOCYTESUR SMALL (A) 04/22/2015 2105     STUDIES: US Venous Img Upper Uni Right  Result Date: 12/18/2016 CLINICAL DATA:  Right arm swelling, pain and redness for 6 days. History of bilateral lower extremity DVT is 3 years prior. EXAM: RIGHT UPPER EXTREMITY VENOUS DOPPLER ULTRASOUND TECHNIQUE: Gray-scale  sonography with graded compression, as well as color Doppler and duplex ultrasound were performed to evaluate the upper extremity deep venous system from the level of the subclavian vein and including the jugular, axillary, basilic, radial, ulnar and upper cephalic vein. Spectral Doppler was utilized to evaluate flow at rest and with distal augmentation maneuvers. COMPARISON:  None. FINDINGS: Contralateral Subclavian Vein: Respiratory phasicity is normal and symmetric with the  symptomatic side. No evidence of thrombus. Normal compressibility. Internal Jugular Vein: No evidence of thrombus. Normal compressibility, respiratory phasicity and response to augmentation. Subclavian Vein: No evidence of thrombus. Normal compressibility, respiratory phasicity and response to augmentation. Axillary Vein: No evidence of thrombus. Normal compressibility, respiratory phasicity and response to augmentation. Cephalic Vein: No evidence of thrombus. Normal compressibility, respiratory phasicity and response to augmentation. Basilic Vein: No evidence of thrombus. Normal compressibility, respiratory phasicity and response to augmentation. Brachial Veins: No evidence of thrombus. Normal compressibility, respiratory phasicity and response to augmentation. Radial Veins: Occlusive thrombus with noncompressibility. Ulnar Veins: No evidence of thrombus. Normal compressibility, respiratory phasicity and response to augmentation. Venous Reflux:  None visualized. Other Findings:  None visualized. IMPRESSION: Positive for right upper extremity DVT in the radial vein with occlusive thrombus. Critical Value/emergent results were called by telephone at the time of interpretation on 12/18/2016 at 10:08 pm to Dr. Clarene Duke, who verbally acknowledged these results. Electronically Signed   By: Rubye Oaks M.D.   On: 12/18/2016 22:11    ELIGIBLE FOR AVAILABLE RESEARCH PROTOCOL: no  ASSESSMENT: 31 y.o. Time woman  PLAN: We spent the better  part of today's hour-long appointment discussing the biology of breast cancer in general, and the specifics of the patient's tumor in particular.  Review of the [place] controlled substances reporting system shows no narcotics.  Cassandra White has a good understanding of the overall plan. She agrees with it. She knows the goal of treatment in her case is cure. She will call with any problems that may develop before her next visit here.  Lowella Dell, MD   12/29/2016 9:19 AM Medical Oncology and Hematology [facility] [address] [place], [place] [address] Tel. [phone]    Fax. [phone]

## 2017-01-02 ENCOUNTER — Telehealth: Payer: Self-pay | Admitting: Family Medicine

## 2017-01-02 DIAGNOSIS — N921 Excessive and frequent menstruation with irregular cycle: Secondary | ICD-10-CM

## 2017-01-02 NOTE — Telephone Encounter (Signed)
Relation to HQ:IONGpt:self Call back number:[phone] Pharmacy:  Reason for call:  Patient would like to discuss Rivaroxaban 15 & 20 MG TBPK, patient didn't want to elaborate, please advise

## 2017-01-03 NOTE — Telephone Encounter (Signed)
Called her back- she has noted menstrual bleeding for about 1 week with some clots. This is since she started xarelto for DVT. She had not bled for several months prior - irregular menses- but generally her periods might last 3-4 days. She is otherwise feeling ok- not faint or lightheaded.  Asked her to come in for a CBC/ ferritin in the next 1-2 days and she plans to do so

## 2017-01-04 ENCOUNTER — Inpatient Hospital Stay (HOSPITAL_COMMUNITY)
Admission: AD | Admit: 2017-01-04 | Discharge: 2017-01-04 | Disposition: A | Discharge status: Home / Self Care | Payer: BLUE CROSS/BLUE SHIELD | Source: Ambulatory Visit | Attending: Obstetrics & Gynecology | Admitting: Obstetrics & Gynecology

## 2017-01-04 ENCOUNTER — Telehealth: Payer: Self-pay | Admitting: Family Medicine

## 2017-01-04 ENCOUNTER — Encounter (HOSPITAL_COMMUNITY): Payer: Self-pay | Admitting: *Deleted

## 2017-01-04 DIAGNOSIS — N938 Other specified abnormal uterine and vaginal bleeding: Secondary | ICD-10-CM | POA: Insufficient documentation

## 2017-01-04 DIAGNOSIS — N939 Abnormal uterine and vaginal bleeding, unspecified: Secondary | ICD-10-CM

## 2017-01-04 DIAGNOSIS — Z87891 Personal history of nicotine dependence: Secondary | ICD-10-CM | POA: Insufficient documentation

## 2017-01-04 DIAGNOSIS — K625 Hemorrhage of anus and rectum: Secondary | ICD-10-CM

## 2017-01-04 HISTORY — DX: Obesity, unspecified: E66.9

## 2017-01-04 LAB — CBC
HEMATOCRIT: 31.6 % — AB (ref 36.0–46.0)
HEMOGLOBIN: 10.5 g/dL — AB (ref 12.0–15.0)
MCH: 30.8 pg (ref 26.0–34.0)
MCHC: 33.2 g/dL (ref 30.0–36.0)
MCV: 92.7 fL (ref 78.0–100.0)
Platelets: 299 10*3/uL (ref 150–400)
RBC: 3.41 MIL/uL — ABNORMAL LOW (ref 3.87–5.11)
RDW: 13.6 % (ref 11.5–15.5)
WBC: 8.2 10*3/uL (ref 4.0–10.5)

## 2017-01-04 LAB — POCT PREGNANCY, URINE: PREG TEST UR: NEGATIVE

## 2017-01-04 NOTE — MAU Note (Signed)
Urine in the lab

## 2017-01-04 NOTE — Telephone Encounter (Signed)
Called her back- she is still having vaginal bleeding but is now having rectal bleeding as well.  Advised her to go to the ER and she plans to do so

## 2017-01-04 NOTE — MAU Note (Signed)
Pt states she was recently diagnosed with a blood clot in her arm and was started on Zerolto 2 weeks ago. States she began having vaginal bleeding as soon as she started the med. Passing clots vaginally and also is having rectal bleeding. Feels dizzy at times. Lower abd cramping.

## 2017-01-04 NOTE — Discharge Instructions (Signed)
Iron-Rich Diet Iron is a mineral that helps your body to produce hemoglobin. Hemoglobin is a protein in your red blood cells that carries oxygen to your body's tissues. Eating too little iron may cause you to feel weak and tired, and it can increase your risk for infection. Eating enough iron is necessary for your body's metabolism, muscle function, and nervous system. Iron is naturally found in many foods. It can also be added to foods or fortified in foods. There are two types of dietary iron:  Heme iron. Heme iron is absorbed by the body more easily than nonheme iron. Heme iron is found in meat, poultry, and fish.  Nonheme iron. Nonheme iron is found in dietary supplements, iron-fortified grains, beans, and vegetables.  You may need to follow an iron-rich diet if:  You have been diagnosed with iron deficiency or iron-deficiency anemia.  You have a condition that prevents you from absorbing dietary iron, such as: ? Infection in your intestines. ? Celiac disease. This involves long-lasting (chronic) inflammation of your intestines.  You do not eat enough iron.  You eat a diet that is high in foods that impair iron absorption.  You have lost a lot of blood.  You have heavy bleeding during your menstrual cycle.  You are pregnant.  What is my plan? Your health care provider may help you to determine how much iron you need per day based on your condition. Generally, when a person consumes sufficient amounts of iron in the diet, the following iron needs are met:  Men. ? 66-61 years old: 11 mg per day. ? 16-18 years old: 8 mg per day.  Women. ? 50-5 years old: 15 mg per day. ? 47-2 years old: 18 mg per day. ? Over 43 years old: 8 mg per day. ? Pregnant women: 27 mg per day. ? Breastfeeding women: 9 mg per day.  What do I need to know about an iron-rich diet?  Eat fresh fruits and vegetables that are high in vitamin C along with foods that are high in iron. This will help  increase the amount of iron that your body absorbs from food, especially with foods containing nonheme iron. Foods that are high in vitamin C include oranges, peppers, tomatoes, and mango.  Take iron supplements only as directed by your health care provider. Overdose of iron can be life-threatening. If you were prescribed iron supplements, take them with orange juice or a vitamin C supplement.  Cook foods in pots and pans that are made from iron.  Eat nonheme iron-containing foods alongside foods that are high in heme iron. This helps to improve your iron absorption.  Certain foods and drinks contain compounds that impair iron absorption. Avoid eating these foods in the same meal as iron-rich foods or with iron supplements. These include: ? Coffee, black tea, and red wine. ? Milk, dairy products, and foods that are high in calcium. ? Beans, soybeans, and peas. ? Whole grains.  When eating foods that contain both nonheme iron and compounds that impair iron absorption, follow these tips to absorb iron better. ? Soak beans overnight before cooking. ? Soak whole grains overnight and drain them before using. ? Ferment flours before baking, such as using yeast in bread dough. What foods can I eat? Grains Iron-fortified breakfast cereal. Iron-fortified whole-wheat bread. Enriched rice. Sprouted grains. Vegetables Spinach. Potatoes with skin. Green peas. Broccoli. Red and green bell peppers. Fermented vegetables. Fruits Prunes. Raisins. Oranges. Strawberries. Mango. Grapefruit. Meats and Other Protein Sources  Beef liver. Oysters. Beef. Shrimp. [place]. Chicken. Tuna. Sardines. Chickpeas. Nuts. Tofu. Beverages Tomato juice. Fresh orange juice. Prune juice. Hibiscus tea. Fortified instant breakfast shakes. Condiments Tahini. Fermented soy sauce. Sweets and Desserts Black-strap molasses. Other Wheat germ. The items listed above may not be a complete list of recommended foods or beverages.  Contact your dietitian for more options. What foods are not recommended? Grains Whole grains. Bran cereal. Bran flour. Oats. Vegetables Artichokes. Brussels sprouts. Kale. Fruits Blueberries. Raspberries. Strawberries. Figs. Meats and Other Protein Sources Soybeans. Products made from soy protein. Dairy Milk. Cream. Cheese. Yogurt. Cottage cheese. Beverages Coffee. Black tea. Red wine. Sweets and Desserts Cocoa. Chocolate. Ice cream. Other Basil. Oregano. Parsley. The items listed above may not be a complete list of foods and beverages to avoid. Contact your dietitian for more information. This information is not intended to replace advice given to you by your health care provider. Make sure you discuss any questions you have with your health care provider. Document Released: 01/17/2005 Document Revised: 12/24/2015 Document Reviewed: 12/31/2013 Elsevier Interactive Patient Education  2018 [employer]. Dysfunctional Uterine Bleeding Dysfunctional uterine bleeding is abnormal bleeding from the uterus. Dysfunctional uterine bleeding includes:  A period that comes earlier or later than usual.  A period that is lighter, heavier, or has blood clots.  Bleeding between periods.  Skipping one or more periods.  Bleeding after sexual intercourse.  Bleeding after menopause.  Follow these instructions at home: Pay attention to any changes in your symptoms. Follow these instructions to help with your condition: Eating and drinking  Eat well-balanced meals. Include foods that are high in iron, such as liver, meat, shellfish, green leafy vegetables, and eggs.  If you become constipated: ? Drink plenty of water. ? Eat fruits and vegetables that are high in water and fiber, such as spinach, carrots, raspberries, apples, and mango. Medicines  Take over-the-counter and prescription medicines only as told by your health care provider.  Do not change medicines without talking with your  health care provider.  Aspirin or medicines that contain aspirin may make the bleeding worse. Do not take those medicines: ? During the week before your period. ? During your period.  If you were prescribed iron pills, take them as told by your health care provider. Iron pills help to replace iron that your body loses because of this condition. Activity  If you need to change your sanitary pad or tampon more than one time every 2 hours: ? Lie in bed with your feet raised (elevated). ? Place a cold pack on your lower abdomen. ? Rest as much as possible until the bleeding stops or slows down.  Do not try to lose weight until the bleeding has stopped and your blood iron level is back to normal. Other Instructions  For two months, write down: ? When your period starts. ? When your period ends. ? When any abnormal bleeding occurs. ? What problems you notice.  Keep all follow up visits as told by your health care provider. This is important. Contact a health care provider if:  You get light-headed or weak.  You have nausea and vomiting.  You cannot eat or drink without vomiting.  You feel dizzy or have diarrhea while you are taking medicines.  You are taking birth control pills or hormones, and you want to change them or stop taking them. Get help right away if:  You develop a fever or chills.  You need to change your sanitary pad or tampon  more than one time per hour.  Your bleeding becomes heavier, or your flow contains clots more often.  You develop pain in your abdomen.  You lose consciousness.  You develop a rash. This information is not intended to replace advice given to you by your health care provider. Make sure you discuss any questions you have with your health care provider. Document Released: 06/02/2000 Document Revised: 11/11/2015 Document Reviewed: 08/31/2014 Elsevier Interactive Patient Education  2018 [employer].  Rectal Bleeding Rectal bleeding is  when blood comes out of the opening of the butt (anus). People with this kind of bleeding may notice bright red blood in their underwear or in the toilet after they poop (have a bowel movement). They may also have dark red or black poop (stool). Rectal bleeding is often a sign that something is wrong. It needs to be checked by a doctor. Follow these instructions at home: Watch for any changes in your condition. Take these actions to help with bleeding and discomfort:  Eat a diet that is high in fiber. This will keep your poop soft so it is easier for you to poop without pushing too hard. Ask your doctor to tell you what foods and drinks are high in fiber.  Drink enough fluid to keep your pee (urine) clear or pale yellow. This also helps keep your poop soft.  Try taking a warm bath. This may help with pain.  Keep all follow-up visits as told by your doctor. This is important.  Get help right away if:  You have new bleeding.  You have more bleeding than before.  You have black or dark red poop.  You throw up (vomit) blood or something that looks like coffee grounds.  You have pain or tenderness in your belly (abdomen).  You have a fever.  You feel weak.  You feel sick to your stomach (nauseous).  You pass out (faint).  You have very bad pain in your butt.  You cannot poop. This information is not intended to replace advice given to you by your health care provider. Make sure you discuss any questions you have with your health care provider. Document Released: 02/15/2011 Document Revised: 11/11/2015 Document Reviewed: 08/01/2015 Elsevier Interactive Patient Education  [employer].

## 2017-01-04 NOTE — Telephone Encounter (Signed)
Pt called in to request to speak with pcp.   CB: [phone]

## 2017-01-04 NOTE — MAU Provider Note (Signed)
Chief Complaint:  Vaginal Bleeding; Rectal Bleeding; and Abdominal Pain   First Provider Initiated Contact with Patient 01/04/17 1948       HPI: Cassandra White is a 31 y.o. G1P0100 who presents to maternity admissions reporting vaginal and rectal bleeding on Xarelto.  Had previously had regular periods, but recently had gone 2-3 months without having a cycle.  Then for the last 2 weeks, has bled every day.  States soaks a tampon every hour for 2 weeks.  Also had 3 BMs today with bright red blood filling toilet.  Did not have rectal bleeding between BMs. She reports vaginal bleeding, but no vaginal itching/burning, urinary symptoms, h/a, n/v, or fever/chills.    Vaginal Bleeding  The patient's primary symptoms include vaginal bleeding. The patient's pertinent negatives include no genital itching, genital lesions, genital odor or pelvic pain. This is a new problem. The current episode started 1 to 4 weeks ago. The problem occurs constantly. The problem has been unchanged. The patient is experiencing no pain. She is not pregnant. Associated symptoms include abdominal pain. Pertinent negatives include no chills, constipation, diarrhea, dysuria, fever, frequency, nausea or vomiting. The vaginal discharge was bloody. The vaginal bleeding is heavier than menses. She has been passing clots. She has not been passing tissue. Nothing aggravates the symptoms. She has tried nothing for the symptoms. She uses nothing for contraception.  Rectal Bleeding   The current episode started today. The onset was sudden. Progression since onset: Occurred with BMs only. The patient is experiencing no pain. The stool is described as bloody. Associated symptoms include abdominal pain and vaginal bleeding. Pertinent negatives include no fever, no diarrhea, no nausea and no vomiting.  Abdominal Pain  This is a new problem. The current episode started 1 to 4 weeks ago. The onset quality is gradual. The problem occurs intermittently.  The problem has been unchanged. The pain is located in the suprapubic region, LLQ and RLQ. The pain is mild. The quality of the pain is cramping. The abdominal pain does not radiate. Associated symptoms include hematochezia. Pertinent negatives include no constipation, diarrhea, dysuria, fever, frequency, nausea or vomiting.   RN Note: Pt states she was recently diagnosed with a blood clot in her arm and was started on Zerolto 2 weeks ago. States she began having vaginal bleeding as soon as she started the med. Passing clots vaginally and also is having rectal bleeding. Feels dizzy at times. Lower abd cramping   Past Medical History: Past Medical History:  Diagnosis Date   Abnormal Pap smear    Last pap smear normal    Asthma    Breast disorder    lump in right breast   Diabetes mellitus without complication (HCC)    Edema of left lower extremity    Since age 62    History of blood clots    right leg   Obese     Past obstetric history: OB History  Gravida Para Term Preterm AB Living  1 1   1  0 0  SAB TAB Ectopic Multiple Live Births  0            # Outcome Date GA Lbr Len/2nd Weight Sex Delivery Anes PTL Lv  1 Preterm  [redacted]w[redacted]d       FD      Past Surgical History: Past Surgical History:  Procedure Laterality Date   BUNIONECTOMY     right foot   COLPOSCOPY  2009/2010    Family History: Family History  Problem  Relation Age of Onset   Hypertension Mother    Asthma Mother    Pulmonary embolism Mother    Deep vein thrombosis Mother    Asthma Brother    Diabetes Father    Cancer Maternal Uncle        lung   Cancer Maternal Grandmother        brain   Cancer Maternal Grandfather        pancreatic   Diabetes Paternal Grandmother    Diabetes Paternal Grandfather     Social History: Social History  Substance Use Topics   Smoking status: Former Smoker    Types: Cigarettes    Quit date: 01/04/2010   Smokeless tobacco: Current User   Alcohol  use Yes     Comment: socially    Allergies:  Allergies  Allergen Reactions   Aspirin Other (See Comments)    Can not take due to ulcer   Latex Hives and Swelling    Meds:  Prescriptions Prior to Admission  Medication Sig Dispense Refill Last Dose   acetaminophen (TYLENOL) 325 MG tablet Take 650 mg by mouth every 6 (six) hours as needed for mild pain or headache.   Past Month at Unknown time   GABAPENTIN PO Take by mouth.   01/03/2017 at Unknown time   Rivaroxaban 15 & 20 MG TBPK Take 15 mg by mouth 2 (two) times daily. Take as directed on package: Start with one 15mg  tablet by mouth twice a day with food. On Day 22, switch to one 20mg  tablet once a day with food. 51 each 0 01/04/2017 at Unknown time   Vitamin D, Ergocalciferol, (DRISDOL) 50000 units CAPS capsule Take 50,000 Units by mouth every 7 (seven) days.   01/03/2017 at Unknown time   acetaZOLAMIDE (DIAMOX) 250 MG tablet Take 250 mg by mouth 3 (three) times daily.   Not Taking   furosemide (LASIX) 20 MG tablet Take 1 tablet (20 mg total) by mouth daily. (Patient not taking: Reported on 12/21/2016) 15 tablet 0 Not Taking   metFORMIN (GLUCOPHAGE) 500 MG tablet Take 1 tablet (500 mg total) by mouth 2 (two) times daily with a meal. Start with one pill a day for 2 weeks 60 tablet 5    omeprazole (PRILOSEC) 20 MG capsule Take 1 capsule (20 mg total) by mouth daily. (Patient not taking: Reported on 12/21/2016) 30 capsule 0 Not Taking   oxyCODONE-acetaminophen (PERCOCET) 5-325 MG per tablet Take 1 tablet by mouth every 4 (four) hours as needed for moderate pain. (Patient not taking: Reported on 12/21/2016) 20 tablet 0 Not Taking    I have reviewed patient's Past Medical Hx, Surgical Hx, Family Hx, Social Hx, medications and allergies.  ROS:  Review of Systems  Constitutional: Negative for chills and fever.  Gastrointestinal: Positive for abdominal pain and hematochezia. Negative for constipation, diarrhea, nausea and vomiting.   Genitourinary: Positive for vaginal bleeding. Negative for dysuria, frequency and pelvic pain.   Other systems negative     Physical Exam  Patient Vitals for the past 24 hrs:  BP Temp Temp src Pulse Resp SpO2 Height Weight  01/04/17 1913 (!) 144/97 98.4 F (36.9 C) Oral 85 20 100 % 5\' 8"  (1.727 m) (!) 351 lb (159.2 kg)   Constitutional: Well-developed, well-nourished female in no acute distress.  Cardiovascular: normal rate and rhythm, no ectopy audible, S1 & S2 heard, no murmur Respiratory: normal effort, no distress. Lungs CTAB with no wheezes or crackles GI: Abd soft, non-tender.  Nondistended.  No rebound, No guarding.  Bowel Sounds audible  MS: Extremities nontender, no edema, normal ROM Neurologic: Alert and oriented x 4.   Grossly nonfocal. GU: Neg CVAT. Skin:  Warm and Dry Psych:  Affect appropriate.  PELVIC EXAM: Ext genitalia within normal limits, Small to moderate blood in vault, dark red.  No clots.  4cm spot on pad.  Difficult to see cervix due to habitus. Unable to feel reproductive organs well due to habitus   Labs: Results for orders placed or performed during the hospital encounter of 01/04/17 (from the past 24 hour(s))  Pregnancy, urine POC     Status: None   Collection Time: 01/04/17  7:46 PM  Result Value Ref Range   Preg Test, Ur NEGATIVE NEGATIVE      Imaging:  [place] Venous Img Upper Uni Right  Result Date: 12/18/2016 CLINICAL DATA:  Right arm swelling, pain and redness for 6 days. History of bilateral lower extremity DVT is 3 years prior. EXAM: RIGHT UPPER EXTREMITY VENOUS DOPPLER ULTRASOUND TECHNIQUE: Gray-scale sonography with graded compression, as well as color Doppler and duplex ultrasound were performed to evaluate the upper extremity deep venous system from the level of the subclavian vein and including the jugular, axillary, basilic, radial, ulnar and upper cephalic vein. Spectral Doppler was utilized to evaluate flow at rest and with distal  augmentation maneuvers. COMPARISON:  None. FINDINGS: Contralateral Subclavian Vein: Respiratory phasicity is normal and symmetric with the symptomatic side. No evidence of thrombus. Normal compressibility. Internal Jugular Vein: No evidence of thrombus. Normal compressibility, respiratory phasicity and response to augmentation. Subclavian Vein: No evidence of thrombus. Normal compressibility, respiratory phasicity and response to augmentation. Axillary Vein: No evidence of thrombus. Normal compressibility, respiratory phasicity and response to augmentation. Cephalic Vein: No evidence of thrombus. Normal compressibility, respiratory phasicity and response to augmentation. Basilic Vein: No evidence of thrombus. Normal compressibility, respiratory phasicity and response to augmentation. Brachial Veins: No evidence of thrombus. Normal compressibility, respiratory phasicity and response to augmentation. Radial Veins: Occlusive thrombus with noncompressibility. Ulnar Veins: No evidence of thrombus. Normal compressibility, respiratory phasicity and response to augmentation. Venous Reflux:  None visualized. Other Findings:  None visualized. IMPRESSION: Positive for right upper extremity DVT in the radial vein with occlusive thrombus. Critical Value/emergent results were called by telephone at the time of interpretation on 12/18/2016 at 10:08 pm to Dr. Clarene DukeLittle, who verbally acknowledged these results. Electronically Signed   By: Rubye OaksMelanie  Ehinger M.D.   On: 12/18/2016 22:11    MAU Course/MDM: I have ordered labs as follows:  CBC, UPT (neg), Orthostatic VS Imaging ordered: none   Consult Dr Langston MaskerMorris with presentation and exam findings. She ordered CBC and VS.     Assessment: Dysfunctional Uterine Bleeding Rectal Bleeding On Xarelto for DVT of upper extremity  Plan: Care turned over to oncoming provider   Wynelle BourgeoisMarie Marulanda CNM, MSN Certified Nurse-Midwife 01/04/2017 7:51 PM  MDM Discussed CBC and orthostatic  results with Dr. Langston MaskerMorris. Ok for discharge at this time. Follow-up with GYN provider in the office.   A: Abnormal vaginal bleeding Rectal bleeding   P: Discharge home Bleeding precautions discussed Patient advised to follow-up with [facility] Patient may return to [facility]U as needed or if her condition were to change or worsen  Kathlene CoteWenzel, Sherree Shankman N, PA-C 01/04/2017 8:59 PM

## 2017-01-19 ENCOUNTER — Telehealth: Payer: Self-pay | Admitting: Family Medicine

## 2017-01-19 NOTE — Telephone Encounter (Signed)
Relation to pt: self  Call back [phone]   Reason for call: patient requesting a referral to Drue DunNik Teppara, MD, FACS bac[facility], please advise

## 2017-01-20 ENCOUNTER — Encounter (HOSPITAL_COMMUNITY): Payer: Self-pay | Admitting: *Deleted

## 2017-01-20 ENCOUNTER — Emergency Department (HOSPITAL_COMMUNITY)
Admission: EM | Admit: 2017-01-20 | Discharge: 2017-01-20 | Disposition: A | Discharge status: Home / Self Care | Payer: Self-pay | Attending: Emergency Medicine | Admitting: Emergency Medicine

## 2017-01-20 ENCOUNTER — Emergency Department (HOSPITAL_COMMUNITY): Payer: Self-pay

## 2017-01-20 DIAGNOSIS — M542 Cervicalgia: Secondary | ICD-10-CM | POA: Insufficient documentation

## 2017-01-20 DIAGNOSIS — Z7901 Long term (current) use of anticoagulants: Secondary | ICD-10-CM

## 2017-01-20 DIAGNOSIS — W19XXXA Unspecified fall, initial encounter: Secondary | ICD-10-CM

## 2017-01-20 DIAGNOSIS — E119 Type 2 diabetes mellitus without complications: Secondary | ICD-10-CM | POA: Insufficient documentation

## 2017-01-20 DIAGNOSIS — Z87891 Personal history of nicotine dependence: Secondary | ICD-10-CM | POA: Insufficient documentation

## 2017-01-20 DIAGNOSIS — J45909 Unspecified asthma, uncomplicated: Secondary | ICD-10-CM | POA: Insufficient documentation

## 2017-01-20 DIAGNOSIS — R51 Headache: Secondary | ICD-10-CM | POA: Insufficient documentation

## 2017-01-20 DIAGNOSIS — Z79899 Other long term (current) drug therapy: Secondary | ICD-10-CM | POA: Insufficient documentation

## 2017-01-20 DIAGNOSIS — Z91040 Latex allergy status: Secondary | ICD-10-CM | POA: Insufficient documentation

## 2017-01-20 DIAGNOSIS — M545 Low back pain, unspecified: Secondary | ICD-10-CM

## 2017-01-20 DIAGNOSIS — Z7984 Long term (current) use of oral hypoglycemic drugs: Secondary | ICD-10-CM | POA: Insufficient documentation

## 2017-01-20 LAB — BASIC METABOLIC PANEL
ANION GAP: 8 (ref 5–15)
BUN: 6 mg/dL (ref 6–20)
CALCIUM: 8.9 mg/dL (ref 8.9–10.3)
CO2: 24 mmol/L (ref 22–32)
CREATININE: 0.75 mg/dL (ref 0.44–1.00)
Chloride: 105 mmol/L (ref 101–111)
Glucose, Bld: 94 mg/dL (ref 65–99)
Potassium: 3.5 mmol/L (ref 3.5–5.1)
SODIUM: 137 mmol/L (ref 135–145)

## 2017-01-20 LAB — CBC
HEMATOCRIT: 31.9 % — AB (ref 36.0–46.0)
Hemoglobin: 10.7 g/dL — ABNORMAL LOW (ref 12.0–15.0)
MCH: 30.9 pg (ref 26.0–34.0)
MCHC: 33.5 g/dL (ref 30.0–36.0)
MCV: 92.2 fL (ref 78.0–100.0)
Platelets: 343 10*3/uL (ref 150–400)
RBC: 3.46 MIL/uL — ABNORMAL LOW (ref 3.87–5.11)
RDW: 13.7 % (ref 11.5–15.5)
WBC: 8.8 10*3/uL (ref 4.0–10.5)

## 2017-01-20 LAB — I-STAT BETA HCG BLOOD, ED (MC, WL, AP ONLY)

## 2017-01-20 MED ORDER — FENTANYL CITRATE (PF) 100 MCG/2ML IJ SOLN
50.0000 ug | Freq: Once | INTRAMUSCULAR | Status: AC
  Administered 2017-01-20: 50 ug via INTRAVENOUS
  Filled 2017-01-20: qty 2

## 2017-01-20 NOTE — ED Notes (Signed)
Pt asked to go to restroom, this tech informed her that she would not be able to walk to restroom as she has not been cleared from ct scans yet and still has c-collar on. Pt refused bedpan and catheter and told this tech that she would hold her urine until she could walk to restroom. This tech told her that as soon as she was cleared she could walk to restroom and if she couldn't wait any longer to ring her call bell and this tech would help her with a bed pan.

## 2017-01-20 NOTE — ED Notes (Signed)
Pt in c-t

## 2017-01-20 NOTE — Discharge Instructions (Signed)
Use your home pain medication. Gentle movement, heat and cold therapy as discussed. Return if severe worsening of pain, lightheaded, or bleeding.

## 2017-01-20 NOTE — ED Notes (Signed)
Family at bedside. °

## 2017-01-20 NOTE — ED Provider Notes (Signed)
[facility]-EMERGENCY DEPT Provider Note   CSN: [id number] Arrival date & time: 01/20/17  1255     History   Chief Complaint Chief Complaint  Patient presents with   Fall   Back Pain    HPI Erin Fullingrica Mcquiston is a 31 y.o. female.  HPI  31 year old female on Xarelto right upper extremity DVT presents today after ground-level fall. She is complaining of pain to her head, neck, back, and right lower extremity. She has not been ambulatory since the fall. His prior to evaluation here. She denies any loss of consciousness.  Past Medical History:  Diagnosis Date   Abnormal Pap smear    Last pap smear normal    Asthma    Breast disorder    lump in right breast   Diabetes mellitus without complication (HCC)    Edema of left lower extremity    Since age 31    History of blood clots    right leg   Obese     Patient Active Problem List   Diagnosis Date Noted   Deep vein thrombosis (DVT) of right radial vein (HCC) 12/29/2016   Possible exposure to STD 12/12/2011   Fibrocystic breast changes    LEG EDEMA, LEFT 09/14/2006   OBESITY, NOS 08/16/2006    Past Surgical History:  Procedure Laterality Date   BUNIONECTOMY     right foot   COLPOSCOPY  2009/2010    OB History    Gravida Para Term Preterm AB Living   1 1   1  0 0   SAB TAB Ectopic Multiple Live Births   0               Home Medications    Prior to Admission medications   Medication Sig Start Date End Date Taking? Authorizing Provider  acetaminophen (TYLENOL) 325 MG tablet Take 650 mg by mouth every 6 (six) hours as needed for mild pain or headache.    [provider]  gabapentin (NEURONTIN) 100 MG capsule Take 100 mg by mouth 2 (two) times daily.    [provider]  metFORMIN (GLUCOPHAGE) 500 MG tablet Take 1 tablet (500 mg total) by mouth 2 (two) times daily with a meal. Start with one pill a day for 2 weeks 12/26/16   Copland, Gwenlyn FoundJessica C, MD  Rivaroxaban 15 & 20 MG TBPK Take 15 mg by  mouth 2 (two) times daily. Take as directed on package: Start with one 15mg  tablet by mouth twice a day with food. On Day 22, switch to one 20mg  tablet once a day with food. 12/19/16   Arthor CaptainHarris, Abigail, PA-C  Vitamin D, Ergocalciferol, (DRISDOL) 50000 units CAPS capsule Take 50,000 Units by mouth every 7 (seven) days.    [provider]    Family History Family History  Problem Relation Age of Onset   Hypertension Mother    Asthma Mother    Pulmonary embolism Mother    Deep vein thrombosis Mother    Asthma Brother    Diabetes Father    Cancer Maternal Uncle        lung   Cancer Maternal Grandmother        brain   Cancer Maternal Grandfather        pancreatic   Diabetes Paternal Grandmother    Diabetes Paternal Grandfather     Social History Social History  Substance Use Topics   Smoking status: Former Smoker    Types: Cigarettes    Quit date: 01/04/2010   Smokeless  tobacco: Current User   Alcohol use Yes     Comment: socially     Allergies   Aspirin and Latex   Review of Systems Review of Systems  All other systems reviewed and are negative.    Physical Exam Updated Vital Signs BP (!) 141/85 (BP Location: Right Arm)    Pulse 79    Temp 98.1 F (36.7 C) (Oral)    Resp 16    LMP 12/26/2016    SpO2 100%   Physical Exam  Constitutional: She is oriented to person, place, and time. She appears well-developed and well-nourished.  HENT:  Head: Normocephalic and atraumatic.  Right Ear: External ear normal.  Left Ear: External ear normal.  Eyes: Pupils are equal, round, and reactive to light. EOM are normal.  Neck: Normal range of motion.  Cardiovascular: Normal rate.   Pulmonary/Chest: Effort normal.  Abdominal: Soft. Bowel sounds are normal.  Musculoskeletal: Normal range of motion.  ttp from cervical spine through thoracic and lumbar without differentiation  ttp over mid right lower laeral leg  Neurological: She is alert and oriented to  person, place, and time.  Skin: Skin is warm. Capillary refill takes less than 2 seconds.  Psychiatric: She has a normal mood and affect.  Nursing note and vitals reviewed.    ED Treatments / Results  Labs (all labs ordered are listed, but only abnormal results are displayed) Labs Reviewed  CBC - Abnormal; Notable for the following:       Result Value   RBC 3.46 (*)    Hemoglobin 10.7 (*)    HCT 31.9 (*)    All other components within normal limits  BASIC METABOLIC PANEL  I-STAT BETA HCG BLOOD, ED ([facility], [facility], [facility] ONLY)    EKG  EKG Interpretation None       Radiology No results found.  Procedures Procedures (including critical care time)  Medications Ordered in ED Medications  fentaNYL (SUBLIMAZE) injection 50 mcg (not administered)     Initial Impression / Assessment and Plan / ED Course  I have reviewed the triage vital signs and the nursing notes.  Pertinent labs & imaging results that were available during my care of the patient were reviewed by me and considered in my medical decision making (see chart for details).     1- fall 2- patient on xarelto- checked cbc- hemoglobin stable, no obvious bleeding 3- anemia stable -  Patient feels improved.  She states right lower leg improved and was able to ambulate.  We discussed return precautions especially given anticoagulation.  She has pain meds at home.  She and partner voice understanding.  Final Clinical Impressions(s) / ED Diagnoses   Final diagnoses:  Fall, initial encounter  Acute midline low back pain without sciatica  Chronic anticoagulation    New Prescriptions New Prescriptions   No medications on file     Margarita Grizzleay, Tomeca Helm, MD 01/20/17 [phone]

## 2017-01-20 NOTE — ED Triage Notes (Signed)
To ED via [facility] for eval of upper back pain, right wrist pain, right head pain since slipping and falling while walking into the bathroom at her home this am. Hx of right arm DVT so pt is on Xyerelto. No aloc. Pt arrives in c-collar. Moves all extremities

## 2017-01-20 NOTE — ED Notes (Signed)
Returned from radiology. 

## 2017-01-27 NOTE — Progress Notes (Deleted)
[facility] [address] [place], [place] [address] [phone]  Date:  01/29/2017   Name:  Cassandra White   DOB:  11-Aug-1985   MRN:  [id number]  PCP:  Pearline Cables, MD    Chief Complaint: No chief complaint on file.   History of Present Illness:  Cassandra White is a 31 y.o. very pleasant female patient who presents with the following:  I saw this young woman about 6 weeks ago to establish care.  She is here today for a follow-up visit:  She was seen in the ER on 7/2 and was dx with an UE DVT- started on xarelto for treatment ER A/P Patient with new RUE DVT. I have sent a coag panel We are unable to obtain part of the panel at [facility]. Negative preg. No cp/SOB . Plast documented 02 is inacurate. Patient started on xarelto starter pack. Given hematology and PCP follow up. Discussed return precautions.  She has had DVT twice in her legs in the past as well She has a referral to hematology pending Her arm is less swollen but still painful.  She is taking her xarelto without difficulty   Her mother did have a PE, and her MGM had a clot as well. They have never been dx with a familiar clotting disorder but [name] understands that this may be the case   She has also had problems with irregular bleeding recently.  Her last regular menses were in April of this year- she has generally had regular monthly bleeding.  Finally yesterday she had a little bit of bleeding but this went away in just a few hours  No urinary symptoms She is not concerned that she might be pregnant as her partner is a female  No thyroid level recently  Her neurologist is Dr.Onasanya with [facility]. I am not quite clear on why she is seeing him, but they did do some nerve conduction studies recently.  She is not sure of the results She was taking metformin in the past but stopped using this due to stomach upset  Since our last  visit she was seen at [facility]U once for vaginal and rectal bleeding, and then earlier this month following a fall (from ground level)  Lab Results  Component Value Date   HGBA1C 5.1 12/21/2016   She has history of DM but her most recent A1c looked very good as above.  Also morbid obesity Patient Active Problem List   Diagnosis Date Noted   Deep vein thrombosis (DVT) of right radial vein (HCC) 12/29/2016   Possible exposure to STD 12/12/2011   Fibrocystic breast changes    LEG EDEMA, LEFT 09/14/2006   OBESITY, NOS 08/16/2006    Past Medical History:  Diagnosis Date   Abnormal Pap smear    Last pap smear normal    Asthma    Breast disorder    lump in right breast   Diabetes mellitus without complication (HCC)    Edema of left lower extremity    Since age 65    History of blood clots    right leg   Obese     Past Surgical History:  Procedure Laterality Date   BUNIONECTOMY     right foot   COLPOSCOPY  2009/2010    Social History  Substance Use Topics   Smoking status: Former Smoker    Types: Cigarettes    Quit date: 01/04/2010  Smokeless tobacco: Current User   Alcohol use Yes     Comment: socially    Family History  Problem Relation Age of Onset   Hypertension Mother    Asthma Mother    Pulmonary embolism Mother    Deep vein thrombosis Mother    Asthma Brother    Diabetes Father    Cancer Maternal Uncle        lung   Cancer Maternal Grandmother        brain   Cancer Maternal Grandfather        pancreatic   Diabetes Paternal Grandmother    Diabetes Paternal Grandfather     Allergies  Allergen Reactions   Aspirin Other (See Comments)    Can not take due to ulcer   Latex Hives and Swelling    Medication list has been reviewed and updated.  Current Outpatient Prescriptions on File Prior to Visit  Medication Sig Dispense Refill   acetaminophen (TYLENOL) 325 MG tablet Take 650 mg by mouth every 6 (six) hours as needed for  mild pain or headache.     gabapentin (NEURONTIN) 100 MG capsule Take 100 mg by mouth 2 (two) times daily.     Ibuprofen-Famotidine (DUEXIS PO) Take 1-2 tablets by mouth as needed (Pain).     metFORMIN (GLUCOPHAGE) 500 MG tablet Take 1 tablet (500 mg total) by mouth 2 (two) times daily with a meal. Start with one pill a day for 2 weeks (Patient not taking: Reported on 01/20/2017) 60 tablet 5   Rivaroxaban 15 & 20 MG TBPK Take 15 mg by mouth 2 (two) times daily. Take as directed on package: Start with one 15mg  tablet by mouth twice a day with food. On Day 22, switch to one 20mg  tablet once a day with food. 51 each 0   Vitamin D, Ergocalciferol, (DRISDOL) 50000 units CAPS capsule Take 50,000 Units by mouth every 7 (seven) days.     No current facility-administered medications on file prior to visit.     Review of Systems:  As per HPI- otherwise negative.   Physical Examination: There were no vitals filed for this visit. There were no vitals filed for this visit. There is no height or weight on file to calculate BMI. Ideal Body Weight:    GEN: WDWN, NAD, Non-toxic, A & O x 3 HEENT: Atraumatic, Normocephalic. Neck supple. No masses, No LAD. Ears and Nose: No external deformity. CV: RRR, No M/G/R. No JVD. No thrill. No extra heart sounds. PULM: CTA B, no wheezes, crackles, rhonchi. No retractions. No resp. distress. No accessory muscle use. ABD: S, NT, ND, +BS. No rebound. No HSM. EXTR: No c/c/e NEURO Normal gait.  PSYCH: Normally interactive. Conversant. Not depressed or anxious appearing.  Calm demeanor.    Assessment and Plan: ***  Signed Abbe AmsterdamJessica Mabelle Mungin, MD

## 2017-01-29 ENCOUNTER — Ambulatory Visit: Payer: BLUE CROSS/BLUE SHIELD | Admitting: Family Medicine

## 2017-01-29 DIAGNOSIS — Z0289 Encounter for other administrative examinations: Secondary | ICD-10-CM

## 2017-05-21 ENCOUNTER — Encounter (HOSPITAL_COMMUNITY): Payer: Self-pay

## 2017-06-19 DIAGNOSIS — I82409 Acute embolism and thrombosis of unspecified deep veins of unspecified lower extremity: Secondary | ICD-10-CM

## 2017-06-19 HISTORY — DX: Acute embolism and thrombosis of unspecified deep veins of unspecified lower extremity: I82.409

## 2017-06-28 ENCOUNTER — Ambulatory Visit: Payer: Self-pay

## 2017-06-28 NOTE — Telephone Encounter (Signed)
Pt dx with DVT last year to right forearm and pt saying she is having the same symptoms. Pt states she was put on Xarelto but has been off it for a while. Pt having pain (8/10) burning and aching to right FA that worsens when picking up or bending FA. Pt also having left calf pain and swelling. States pain is 4/10 describes it as dull and more swollen. Pt having pain behind right knee which she describes as sharp when she touches it. She rates it 6-7/10. Pt advised to be NPO and to limit activity and allow husband to use wheelchair to take iinto hospital. Reason for Disposition  [1] Thigh, calf, or ankle swelling AND [2] bilateral AND [3] 1 side is more swollen    Left calf more swollen  Answer Assessment - Initial Assessment Questions 1. ONSET: "When did the pain start?"      1 month ago pt has a h/o blood clots 2. LOCATION: "Where is the pain located?"      Left calf and behind right knee 3. PAIN: "How bad is the pain?"    (Scale 1-10; or mild, moderate, severe)   -  MILD (1-3): doesn't interfere with normal activities    -  MODERATE (4-7): interferes with normal activities (e.g., work or school) or awakens from sleep, limping    -  SEVERE (8-10): excruciating pain, unable to do any normal activities, unable to walk     Left calf: 4/10 dull pain  Behind right knee: 7/10 sharp pain when touching it only 4. WORK OR EXERCISE: "Has there been any recent work or exercise that involved this part of the body?"      Pt stands on her feet all day; wears compression hose 5. CAUSE: "What do you think is causing the leg pain?"     Blood clots 6. OTHER SYMPTOMS: "Do you have any other symptoms?" (e.g., chest pain, back pain, breathing difficulty, swelling, rash, fever, numbness, weakness)     Left calf more swollen than usual. Pt states she has been more SOB than usual 7. PREGNANCY: "Is there any chance you are pregnant?" "When was your last menstrual period?" Didn't ask  Protocols used: LEG  PAIN-A-AH

## 2017-06-29 NOTE — Telephone Encounter (Signed)
FYI. I believe Pt was instructed to go to ED.

## 2017-07-11 NOTE — Progress Notes (Addendum)
[facility] [address] [place], [place] [address] [phone]  Date:  07/12/2017   Name:  Cassandra White   DOB:  August 18, 1985   MRN:  [id number]  PCP:  Pearline Cables, MD    Chief Complaint: Follow-up (Pt here for f/u visit. c/o recent increase with SOB> )   History of Present Illness:  Cassandra White is a 32 y.o. very pleasant female patient who presents with the following:  I last saw her in July: History of multiple DVT- here today with concern of lack on menses.  She is not pregnant.  Suspect that she has PCOS/ anovulation due to obesity.  Discussed with pt and she states understanding   Pt notes that she is working on weight loss, and she is losing weight on her bottom half but not on her top She notes that she may get SOB when walking or talking She has noted this for about one month Mild cough No fever Not coughing up blood She may feel like there is congestion in her throat but she does not cough anything up   She is thinking of having bariatric surgery through [facility]- she is having her consultation in February and is excited about it.    She had used xarelto recently due to an arm DVT- she finished this last September She was seeing hematology for this  Her menses are still irregular   She is known to snore - she is not sure if she is snoring more, she will ask her spouse about this  She has really tried to lose weight, but has never been able to do   Wt Readings from Last 3 Encounters:  07/12/17 (!) 354 lb (160.6 kg)  01/04/17 (!) 351 lb (159.2 kg)  12/21/16 (!) 354 lb 3.2 oz (160.7 kg)    Patient Active Problem List   Diagnosis Date Noted   Deep vein thrombosis (DVT) of right radial vein (HCC) 12/29/2016   Possible exposure to STD 12/12/2011   Fibrocystic breast changes    LEG EDEMA, LEFT 09/14/2006   OBESITY, NOS 08/16/2006    Past Medical History:  Diagnosis Date   Abnormal Pap  smear    Last pap smear normal    Asthma    Breast disorder    lump in right breast   Diabetes mellitus without complication (HCC)    Edema of left lower extremity    Since age 41    History of blood clots    right leg   Obese     Past Surgical History:  Procedure Laterality Date   BUNIONECTOMY     right foot   COLPOSCOPY  2009/2010    Social History   Tobacco Use   Smoking status: Former Smoker    Types: Cigarettes    Last attempt to quit: 01/04/2010    Years since quitting: 7.5   Smokeless tobacco: Current User  Substance Use Topics   Alcohol use: Yes    Comment: socially   Drug use: No    Family History  Problem Relation Age of Onset   Hypertension Mother    Asthma Mother    Pulmonary embolism Mother    Deep vein thrombosis Mother    Asthma Brother    Diabetes Father    Cancer Maternal Uncle        lung   Cancer Maternal Grandmother        brain  Cancer Maternal Grandfather        pancreatic   Diabetes Paternal Grandmother    Diabetes Paternal Grandfather     Allergies  Allergen Reactions   Aspirin Other (See Comments)    Can not take due to ulcer   Latex Hives and Swelling    Medication list has been reviewed and updated.  Current Outpatient Medications on File Prior to Visit  Medication Sig Dispense Refill   acetaminophen (TYLENOL) 325 MG tablet Take 650 mg by mouth every 6 (six) hours as needed for mild pain or headache.     gabapentin (NEURONTIN) 100 MG capsule Take 100 mg by mouth 2 (two) times daily.     Ibuprofen-Famotidine (DUEXIS PO) Take 1-2 tablets by mouth as needed (Pain).     metFORMIN (GLUCOPHAGE) 500 MG tablet Take 1 tablet (500 mg total) by mouth 2 (two) times daily with a meal. Start with one pill a day for 2 weeks 60 tablet 5   Rivaroxaban 15 & 20 MG TBPK Take 15 mg by mouth 2 (two) times daily. Take as directed on package: Start with one 15mg  tablet by mouth twice a day with food. On Day 22, switch  to one 20mg  tablet once a day with food. 51 each 0   Vitamin D, Ergocalciferol, (DRISDOL) 50000 units CAPS capsule Take 50,000 Units by mouth every 7 (seven) days.     No current facility-administered medications on file prior to visit.     Review of Systems:  As per HPI- otherwise negative.   Physical Examination: Vitals:   07/12/17 1713  BP: (!) 138/96  Pulse: 85  Temp: 98.5 F (36.9 C)  SpO2: 99%   Vitals:   07/12/17 1713  Weight: (!) 354 lb (160.6 kg)  Height: 5\' 8"  (1.727 m)   Body mass index is 53.83 kg/m. Ideal Body Weight: Weight in (lb) to have BMI = 25: 164.1  GEN: WDWN, NAD, Non-toxic, A & O x 3, obese, large frame and macromastia  HEENT: Atraumatic, Normocephalic. Neck supple. No masses, No LAD.  Bilateral TM wnl, oropharynx normal.  PEERL,EOMI.   Ears and Nose: No external deformity. CV: RRR, No M/G/R. No JVD. No thrill. No extra heart sounds. PULM: CTA B, no wheezes, crackles, rhonchi. No retractions. No resp. distress. No accessory muscle use. ABD: S, NT, ND, +BS. No rebound. No HSM. EXTR: No c/c/e NEURO Normal gait.  PSYCH: Normally interactive. Conversant. Not depressed or anxious appearing.  Calm demeanor.  No arm or leg swelling or pain to suggest a DVT  Results for orders placed or performed in visit on 07/12/17  POCT urine pregnancy  Result Value Ref Range   Preg Test, Ur Negative Negative    Assessment and Plan: Shortness of breath - Plan: CBC, Comprehensive metabolic panel, POCT urine pregnancy, CT Angio Chest W/Cm &/Or Wo Cm  History of DVT (deep vein thrombosis) - Plan: CT Angio Chest W/Cm &/Or Wo Cm  Class 3 severe obesity without serious comorbidity with body mass index (BMI) of 50.0 to 59.9 in adult, unspecified obesity type (HCC)  Here today for shortness of breath for about one month She has a history of multiple blood clots and is not on any anticoagulation currently Decided to send her straight for a CT angio as a d dimer may not  be sufficient in this case Plan to get a CT angio tomorrow She is thinking of getting weight loss surgery which I think would be a good thing for her.  Signed Abbe AmsterdamJessica Matina Rodier, MD  Received her labs- mild anemia, stable  Results for orders placed or performed in visit on 07/12/17  CBC  Result Value Ref Range   WBC 9.0 4.0 - 10.5 K/uL   RBC 3.57 (L) 3.87 - 5.11 Mil/uL   Platelets 333.0 150.0 - 400.0 K/uL   Hemoglobin 11.2 (L) 12.0 - 15.0 g/dL   HCT 16.132.7 (L) 09.636.0 - 04.546.0 %   MCV 91.5 78.0 - 100.0 fl   MCHC 34.2 30.0 - 36.0 g/dL   RDW 40.914.0 81.111.5 - 91.415.5 %  Comprehensive metabolic panel  Result Value Ref Range   Sodium 139 135 - 145 mEq/L   Potassium 3.6 3.5 - 5.1 mEq/L   Chloride 103 96 - 112 mEq/L   CO2 27 19 - 32 mEq/L   Glucose, Bld 79 70 - 99 mg/dL   BUN 11 6 - 23 mg/dL   Creatinine, Ser 7.820.73 0.40 - 1.20 mg/dL   Total Bilirubin 0.3 0.2 - 1.2 mg/dL   Alkaline Phosphatase 46 39 - 117 U/L   AST 14 0 - 37 U/L   ALT 13 0 - 35 U/L   Total Protein 7.6 6.0 - 8.3 g/dL   Albumin 4.1 3.5 - 5.2 g/dL   Calcium 9.2 8.4 - 95.610.5 mg/dL   GFR 213.08120.54 >65.78>60.00 mL/min  POCT urine pregnancy  Result Value Ref Range   Preg Test, Ur Negative Negative    1/25- got her CT report back and called her about 6pm CT is negative for PE although exam was somewhat limited She had already been contacted by Dr. Para Marchuncan She has not been aware of cardiomegaly and would like to pursue and echo which is reasonable.  I will set this up for her to be done next week  Ct Angio Chest W/cm &/or Wo Cm  Result Date: 07/13/2017 CLINICAL DATA:  Shortness of breath.  Right upper extremity DVT. EXAM: CT ANGIOGRAPHY CHEST WITH CONTRAST TECHNIQUE: Multidetector CT imaging of the chest was performed using the standard protocol during bolus administration of intravenous contrast. Multiplanar CT image reconstructions and MIPs were obtained to evaluate the vascular anatomy. CONTRAST:  100mL ISOVUE-370 IOPAMIDOL (ISOVUE-370)  INJECTION 76% COMPARISON:  None. FINDINGS: Cardiovascular: The heart is enlarged. No pericardial effusion. No thoracic aortic aneurysm evaluation of the pulmonary arteries is markedly degraded due to technical factors related to the patient's large body habitus. There is no large central pulmonary embolus. No evidence for lobar pulmonary embolus. Segmental and subsegmental pulmonary arteries are not reliably evaluated due to image quality. Mediastinum/Nodes: No mediastinal lymphadenopathy. There is no hilar lymphadenopathy. The esophagus has normal imaging features. There is no axillary lymphadenopathy. Lungs/Pleura: No focal airspace consolidation. No pulmonary edema or pleural effusion Upper Abdomen: Unremarkable Musculoskeletal: Bone windows reveal no worrisome lytic or sclerotic osseous lesions. Review of the MIP images confirms the above findings. IMPRESSION: 1. Limited study due to technical factors related to large body habitus. No large central pulmonary embolus segmental and subsegmental pulmonary arteries not reliably evaluated. 2. No pulmonary edema, focal consolidation, or pleural effusion. 3. Cardiomegaly. Electronically Signed   By: Kennith CenterEric  Mansell M.D.   On: 07/13/2017 17:56

## 2017-07-12 ENCOUNTER — Ambulatory Visit: Payer: BLUE CROSS/BLUE SHIELD | Admitting: Family Medicine

## 2017-07-12 VITALS — BP 138/96 | HR 85 | Temp 98.5°F | Ht 68.0 in | Wt 354.0 lb

## 2017-07-12 DIAGNOSIS — I517 Cardiomegaly: Secondary | ICD-10-CM

## 2017-07-12 DIAGNOSIS — Z6841 Body Mass Index (BMI) 40.0 and over, adult: Secondary | ICD-10-CM | POA: Diagnosis not present

## 2017-07-12 DIAGNOSIS — R0602 Shortness of breath: Secondary | ICD-10-CM | POA: Diagnosis not present

## 2017-07-12 DIAGNOSIS — Z86718 Personal history of other venous thrombosis and embolism: Secondary | ICD-10-CM

## 2017-07-12 LAB — POCT URINE PREGNANCY: Preg Test, Ur: NEGATIVE

## 2017-07-12 NOTE — Patient Instructions (Signed)
Good to see you today- best of luck with your bariatric consultation We will set you up for a CT angiogram to make sure that you do not have a blood clot causing your shortness of breath  Please let me know if you don't hear about your CT appt tomorrow

## 2017-07-13 ENCOUNTER — Telehealth: Payer: Self-pay | Admitting: Family Medicine

## 2017-07-13 ENCOUNTER — Telehealth: Payer: Self-pay | Admitting: *Deleted

## 2017-07-13 ENCOUNTER — Encounter (HOSPITAL_BASED_OUTPATIENT_CLINIC_OR_DEPARTMENT_OTHER): Payer: Self-pay

## 2017-07-13 ENCOUNTER — Ambulatory Visit (HOSPITAL_BASED_OUTPATIENT_CLINIC_OR_DEPARTMENT_OTHER)
Admission: RE | Admit: 2017-07-13 | Discharge: 2017-07-13 | Disposition: A | Discharge status: Home / Self Care | Payer: BLUE CROSS/BLUE SHIELD | Source: Ambulatory Visit | Attending: Family Medicine | Admitting: Family Medicine

## 2017-07-13 DIAGNOSIS — I517 Cardiomegaly: Secondary | ICD-10-CM | POA: Insufficient documentation

## 2017-07-13 DIAGNOSIS — R0602 Shortness of breath: Secondary | ICD-10-CM | POA: Diagnosis not present

## 2017-07-13 DIAGNOSIS — Z86718 Personal history of other venous thrombosis and embolism: Secondary | ICD-10-CM | POA: Diagnosis not present

## 2017-07-13 LAB — COMPREHENSIVE METABOLIC PANEL
ALBUMIN: 4.1 g/dL (ref 3.5–5.2)
ALK PHOS: 46 U/L (ref 39–117)
ALT: 13 U/L (ref 0–35)
AST: 14 U/L (ref 0–37)
BILIRUBIN TOTAL: 0.3 mg/dL (ref 0.2–1.2)
BUN: 11 mg/dL (ref 6–23)
CALCIUM: 9.2 mg/dL (ref 8.4–10.5)
CO2: 27 mEq/L (ref 19–32)
CREATININE: 0.73 mg/dL (ref 0.40–1.20)
Chloride: 103 mEq/L (ref 96–112)
GFR: 120.54 mL/min (ref >60.00)
Glucose, Bld: 79 mg/dL (ref 70–99)
Potassium: 3.6 mEq/L (ref 3.5–5.1)
SODIUM: 139 meq/L (ref 135–145)
TOTAL PROTEIN: 7.6 g/dL (ref 6.0–8.3)

## 2017-07-13 LAB — CBC
HCT: 32.7 % — ABNORMAL LOW (ref 36.0–46.0)
Hemoglobin: 11.2 g/dL — ABNORMAL LOW (ref 12.0–15.0)
MCHC: 34.2 g/dL (ref 30.0–36.0)
MCV: 91.5 fl (ref 78.0–100.0)
PLATELETS: 333 10*3/uL (ref 150.0–400.0)
RBC: 3.57 Mil/uL — AB (ref 3.87–5.11)
RDW: 14 % (ref 11.5–15.5)
WBC: 9 10*3/uL (ref 4.0–10.5)

## 2017-07-13 MED ORDER — IOPAMIDOL (ISOVUE-370) INJECTION 76%
125.0000 mL | Freq: Once | INTRAVENOUS | Status: AC | PRN
  Administered 2017-07-13: 100 mL via INTRAVENOUS

## 2017-07-13 NOTE — Telephone Encounter (Signed)
Verified with front ofc supervisor that PA was not required. Spoke with radiology and scheduled CT for today at 4:30pm. Pt has been notified and is agreeable.  Copied from CRM [phone]. Topic: General - Other >> Jul 13, 2017  3:23 PM Percival SpanishKennedy, Cheryl W wrote:   Pt call to follow up on her CT scan that she thought she was going to have today  . [phone] She said she had been waiting for someone to call and tell her where to go

## 2017-07-13 NOTE — Telephone Encounter (Signed)
I talked to patient about her CT scan.  1. Limited study due to technical factors related to large body habitus. No large central pulmonary embolus segmental and subsegmental pulmonary arteries not reliably evaluated. 2. No pulmonary edema, focal consolidation, or pleural effusion. 3. Cardiomegaly.  Cardiomegaly is new compared to CXR on 11/24/15.   She has had chronic SOB over the last ~month.  She didn't know how much of this could be attributed to her weight.   D/w pt about options.  She is speaking in complete sentences.  She doesn't sound SOB.  She didn't think she had sx that were severe enough to go to the ER tonight.  I gave her routine ER cautions.  She has no CP.  It may be reasonable to get [facility] tomorrow/in the near future.  She may end up needing BNP and/or echo done given the cardiomegaly.  I d/w pt but I will defer to PCP.    She agreed with the plan.  I will defer to PCP. I appreciate the help of all involved.

## 2017-07-14 ENCOUNTER — Encounter: Payer: Self-pay | Admitting: Family Medicine

## 2017-07-14 DIAGNOSIS — R0602 Shortness of breath: Secondary | ICD-10-CM

## 2017-07-14 NOTE — Addendum Note (Signed)
Addended by: Abbe AmsterdamOPLAND, Maison Agrusa C on: 07/14/2017 11:47 AM   Modules accepted: Orders

## 2017-07-16 ENCOUNTER — Other Ambulatory Visit: Payer: Self-pay | Admitting: Family Medicine

## 2017-07-16 DIAGNOSIS — R0602 Shortness of breath: Secondary | ICD-10-CM

## 2017-07-17 ENCOUNTER — Ambulatory Visit (HOSPITAL_BASED_OUTPATIENT_CLINIC_OR_DEPARTMENT_OTHER)
Admission: RE | Admit: 2017-07-17 | Discharge: 2017-07-17 | Disposition: A | Discharge status: Home / Self Care | Payer: BLUE CROSS/BLUE SHIELD | Source: Ambulatory Visit | Attending: Family Medicine | Admitting: Family Medicine

## 2017-07-17 DIAGNOSIS — R0602 Shortness of breath: Secondary | ICD-10-CM | POA: Diagnosis not present

## 2017-07-20 ENCOUNTER — Telehealth: Payer: Self-pay | Admitting: Family Medicine

## 2017-07-20 ENCOUNTER — Encounter: Payer: Self-pay | Admitting: Family Medicine

## 2017-07-20 ENCOUNTER — Other Ambulatory Visit: Payer: Self-pay

## 2017-07-20 ENCOUNTER — Ambulatory Visit (HOSPITAL_COMMUNITY): Payer: BLUE CROSS/BLUE SHIELD | Attending: Cardiology

## 2017-07-20 DIAGNOSIS — R0600 Dyspnea, unspecified: Secondary | ICD-10-CM | POA: Diagnosis not present

## 2017-07-20 DIAGNOSIS — Z86718 Personal history of other venous thrombosis and embolism: Secondary | ICD-10-CM | POA: Diagnosis not present

## 2017-07-20 DIAGNOSIS — R0602 Shortness of breath: Secondary | ICD-10-CM

## 2017-07-20 DIAGNOSIS — R600 Localized edema: Secondary | ICD-10-CM | POA: Insufficient documentation

## 2017-07-20 DIAGNOSIS — I517 Cardiomegaly: Secondary | ICD-10-CM | POA: Diagnosis not present

## 2017-07-20 DIAGNOSIS — E119 Type 2 diabetes mellitus without complications: Secondary | ICD-10-CM | POA: Insufficient documentation

## 2017-07-20 NOTE — Telephone Encounter (Signed)
Pt scheduled for Monday 07/23/17 to [facility]. Advised that pt should arrive 20 mins early to appt for xray. I advised the pt to get there as early as possible to have xray done to make sure she will be on time for the scan at 1pm. Also provided pt with the [facility] in case she has any other questions about her appointment.   Dr. Patsy Lageropland would like to make sure the patient is not charge again for this xray since the one she had a few days ago was only good for 24 hrs.

## 2017-07-20 NOTE — Telephone Encounter (Signed)
I had ordered this VQ scan on 1/27, was then informed that a CXR had to be ordered first so I ordered this for her on 1/28. The CXR was done on the 29th. Then it seems that nothing happened further and the pt was not scheduled or contacted about her VQ scan. The pt contacted me today, 2/1, to inquire about her scan.   unfortunately her CXR will now have to be done again as it must be within 24 hours of the VQ scan

## 2017-07-23 ENCOUNTER — Ambulatory Visit (HOSPITAL_COMMUNITY)
Admission: RE | Admit: 2017-07-23 | Discharge: 2017-07-23 | Disposition: A | Discharge status: Home / Self Care | Payer: BLUE CROSS/BLUE SHIELD | Source: Ambulatory Visit | Attending: Family Medicine | Admitting: Family Medicine

## 2017-07-23 ENCOUNTER — Encounter: Payer: Self-pay | Admitting: Family Medicine

## 2017-07-23 DIAGNOSIS — R0602 Shortness of breath: Secondary | ICD-10-CM

## 2017-07-23 MED ORDER — TECHNETIUM TO 99M ALBUMIN AGGREGATED
4.2000 | Freq: Once | INTRAVENOUS | Status: AC | PRN
  Administered 2017-07-23: 4.2 via INTRAVENOUS

## 2017-07-23 MED ORDER — TECHNETIUM TC 99M DIETHYLENETRIAME-PENTAACETIC ACID
30.1000 | Freq: Once | INTRAVENOUS | Status: AC | PRN
  Administered 2017-07-23: 30.1 via RESPIRATORY_TRACT

## 2017-07-24 ENCOUNTER — Ambulatory Visit (HOSPITAL_COMMUNITY): Payer: BLUE CROSS/BLUE SHIELD

## 2017-07-24 NOTE — Telephone Encounter (Signed)
-----   Message from Burnett HarryAnna J Johnson sent at 07/24/2017  4:00 PM EST ----- Thank you!   Did you order it again yesterday by chance? The only think I am seeing from last week that was ordered was a CT and 2 view DG chest. Just want to make sure I am investigating the right test!  Thanks! ----- Message ----- From: Pearline Cablesopland, Willaim Mode C, MD Sent: 07/23/2017  11:10 AM To: Anna J Johnson  Hi Swazil[place]Jordan- here is the patient I asked you about (with the VQ scan that did not get scheduled) Thank you! JC

## 2017-07-27 ENCOUNTER — Other Ambulatory Visit (HOSPITAL_COMMUNITY): Payer: Self-pay | Admitting: General Surgery

## 2017-08-13 ENCOUNTER — Encounter: Payer: Self-pay | Admitting: Registered"

## 2017-08-13 ENCOUNTER — Encounter: Payer: BLUE CROSS/BLUE SHIELD | Attending: General Surgery | Admitting: Registered"

## 2017-08-13 DIAGNOSIS — Z713 Dietary counseling and surveillance: Secondary | ICD-10-CM | POA: Diagnosis not present

## 2017-08-13 DIAGNOSIS — E669 Obesity, unspecified: Secondary | ICD-10-CM

## 2017-08-13 NOTE — Progress Notes (Signed)
Pre-Op Assessment Visit:  Pre-Operative Sleeve Gastrectomy Surgery  Medical Nutrition Therapy:  Appt start time: 2:55  End time: 3:45  Patient was seen on 08/13/2017 for Pre-Operative Nutrition Assessment. Assessment and letter of approval faxed to [facility] coordinator on 08/13/2017.   Pt expectation of surgery: wants to prolong her life, improve quality of life, feel better, increase energy  Pt expectation of Dietitian: help with finding a regimen, guidance  Start weight at NDES: 351.9 BMI: 53.51   Pt states she has adopted a 30 year old son. Pt states she loves chocolate. Pt states she used to cook with a lot of butter. Pt states she has eliminated coffee. Pt states she has an enlarged heart. Pt states she does not feel satisfied with portion sizes throughout her day especially during lunch but tells herself to fill up on water to prevent eating more.   Per insurance, pt needs 3 SWL visits prior to surgery.    24 hr Dietary Recall: First Meal: typically skips; shake or oatmeal with honey, parfait Snack: none Second Meal: sometimes skips; leftovers or fast food or [employer] chicken alfredo Snack: chips, candy Third Meal: shrimp alfredo or baked spaghetti with ground [place]urkey Snack: chocolate or popcorn Beverages: water  Encouraged to engage in 150 minutes of moderate physical activity including cardiovascular and weight baring weekly  Handouts given during visit include:   Pre-Op Goals  Bariatric Surgery Protein Shakes  Vitamin and Mineral Recommendations  During the appointment today the following Pre-Op Goals were reviewed with the patient:  Track your food and beverage: MyFitness Pal or Baritastic App  Make healthy food choices  Begin to limit portion sizes  Limited concentrated sugars and fried foods  Keep fat/sugar in the single digits per serving on          food labels  Practice CHEWING your food  (aim for 30 chews per  bite or until applesauce consistency)  Practice not drinking 15 minutes before, during, and 30 minutes after each meal/snack  Avoid all carbonated beverages   Avoid/limit caffeinated beverages   Avoid all sugar-sweetened beverages  Avoid alcohol  Consume 3 meals per day; eat every 3-5 hours  Make a list of non-food related activities  Aim for 64-100 ounces of FLUID daily   Aim for at least 60-80 grams of PROTEIN daily  Look for a liquid protein source that contain ?15 g protein and ?5 g carbohydrate  (ex: shakes, drinks, shots)  Physical activity is an important part of a healthy lifestyle so keep it moving!  Follow diet recommendations listed below Energy and Macronutrient Recommendations: Calories: 1800 Carbohydrate: 200 Protein: 135 Fat: 50  Demonstrated degree of understanding via:  Teach Back   Teaching Method Utilized:  Visual Auditory Hands on  Barriers to learning/adherence to lifestyle change: None identified  Patient to call the Nutrition and Diabetes Education Services to enroll in Pre-Op and Post-Op Nutrition Education when surgery date is scheduled.

## 2017-08-17 ENCOUNTER — Ambulatory Visit (HOSPITAL_COMMUNITY)
Admission: RE | Admit: 2017-08-17 | Discharge: 2017-08-17 | Disposition: A | Discharge status: Home / Self Care | Payer: BLUE CROSS/BLUE SHIELD | Source: Ambulatory Visit | Attending: General Surgery | Admitting: General Surgery

## 2017-09-10 ENCOUNTER — Ambulatory Visit: Payer: Self-pay | Admitting: Registered"

## 2017-09-14 ENCOUNTER — Ambulatory Visit (HOSPITAL_COMMUNITY): Payer: BLUE CROSS/BLUE SHIELD

## 2017-09-14 ENCOUNTER — Encounter (HOSPITAL_COMMUNITY): Payer: Self-pay

## 2017-09-27 ENCOUNTER — Ambulatory Visit: Payer: Self-pay | Admitting: Psychiatry

## 2017-10-11 ENCOUNTER — Ambulatory Visit: Payer: Self-pay | Admitting: Psychiatry

## 2017-11-07 ENCOUNTER — Encounter: Payer: Self-pay | Admitting: Cardiology

## 2019-06-23 IMAGING — NM NM PULMONARY VENT & PERF
15 series · 15 of 15 positions shown · non-contrast
Comparison: Chest x-ray dated 07/23/2017 and CT angiogram of the
chest dated 07/13/2017

CLINICAL DATA: Shortness of breath.

EXAM:
NUCLEAR MEDICINE VENTILATION - PERFUSION LUNG SCAN
TECHNIQUE: Ventilation images were obtained in multiple projections using
inhaled aerosol Zc-AAm DTPA. Perfusion images were obtained in
multiple projections after intravenous injection of Zc-AAm MAA.
RADIOPHARMACEUTICALS:  30.1 mCi of Zc-AAm DTPA aerosol inhalation
and 4.2 mCi WcDDm MAA-IV

[Series 1: ant/post vent · 4.14mm/px · 1 of 1 slices shown (1 of 2)]
[im 1/1]
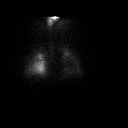

[Series 1: ant/post vent · 4.14mm/px · 1 of 1 slices shown (2 of 2)]
[im 1/1]
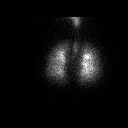

[Series 2: lao/rpo vent · 4.14mm/px · 1 of 1 slices shown (1 of 2)]
[im 1/1]
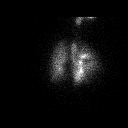

[Series 2: lao/rpo vent · 4.14mm/px · 1 of 1 slices shown (2 of 2)]
[im 1/1]
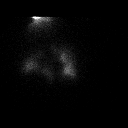

[Series 3: lpo/rao vent · 4.14mm/px · 1 of 1 slices shown (1 of 2)]
[im 1/1  full-range]
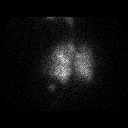

[Series 3: lpo/rao vent · 4.14mm/px · 1 of 1 slices shown (2 of 2)]
[im 1/1]
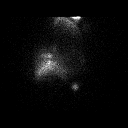

[Series 4: lt lat/rt lat vent · 4.14mm/px · 1 of 1 slices shown (1 of 2)]
[im 1/1]
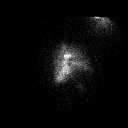

[Series 4: lt lat/rt lat vent · 4.14mm/px · 1 of 1 slices shown (2 of 2)]
[im 1/1]
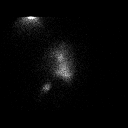

[Series 5: lt lat/rt lat perf · 4.14mm/px · 1 of 1 slices shown (1 of 2)]
[im 1/1]
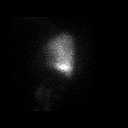

[Series 5: lt lat/rt lat perf · 4.14mm/px · 1 of 1 slices shown (2 of 2)]
[im 1/1]
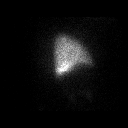

[Series 6: lpo/rao perf · 4.14mm/px · 1 of 1 slices shown (1 of 2)]
[im 1/1]
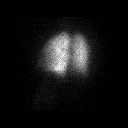

[Series 6: lpo/rao perf · 4.14mm/px · 1 of 1 slices shown (2 of 2)]
[im 1/1]
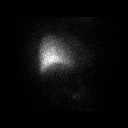

[Series 7: ant/post perf · 4.14mm/px · 1 of 1 slices shown]
[im 1/1]
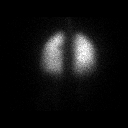

[Series 8: lao/rpo perf · 4.14mm/px · 1 of 1 slices shown (1 of 2)]
[im 1/1]
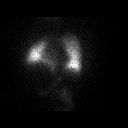

[Series 8: lao/rpo perf · 4.14mm/px · 1 of 1 slices shown (2 of 2)]
[im 1/1]
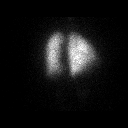

[15 of 15 positions shown; findings below may reference images not displayed]

FINDINGS: Ventilation: No focal ventilation defect.

Perfusion: No wedge shaped peripheral perfusion defects to suggest
acute pulmonary embolism.
IMPRESSION: Normal ventilation-perfusion lung scan. No findings suggestive of
pulmonary embolism.

## 2019-07-07 ENCOUNTER — Emergency Department (HOSPITAL_COMMUNITY): Payer: BLUE CROSS/BLUE SHIELD

## 2019-07-07 ENCOUNTER — Other Ambulatory Visit: Payer: Self-pay

## 2019-07-07 ENCOUNTER — Emergency Department (HOSPITAL_BASED_OUTPATIENT_CLINIC_OR_DEPARTMENT_OTHER): Payer: BLUE CROSS/BLUE SHIELD

## 2019-07-07 ENCOUNTER — Encounter (HOSPITAL_COMMUNITY): Payer: Self-pay

## 2019-07-07 ENCOUNTER — Emergency Department (HOSPITAL_COMMUNITY)
Admission: EM | Admit: 2019-07-07 | Discharge: 2019-07-07 | Disposition: A | Discharge status: Home / Self Care | Payer: BLUE CROSS/BLUE SHIELD | Attending: Emergency Medicine | Admitting: Emergency Medicine

## 2019-07-07 DIAGNOSIS — Z7984 Long term (current) use of oral hypoglycemic drugs: Secondary | ICD-10-CM | POA: Insufficient documentation

## 2019-07-07 DIAGNOSIS — Z87891 Personal history of nicotine dependence: Secondary | ICD-10-CM | POA: Insufficient documentation

## 2019-07-07 DIAGNOSIS — Z886 Allergy status to analgesic agent status: Secondary | ICD-10-CM | POA: Insufficient documentation

## 2019-07-07 DIAGNOSIS — M79602 Pain in left arm: Secondary | ICD-10-CM

## 2019-07-07 DIAGNOSIS — M79622 Pain in left upper arm: Secondary | ICD-10-CM | POA: Insufficient documentation

## 2019-07-07 DIAGNOSIS — M7989 Other specified soft tissue disorders: Secondary | ICD-10-CM

## 2019-07-07 DIAGNOSIS — Z86718 Personal history of other venous thrombosis and embolism: Secondary | ICD-10-CM | POA: Insufficient documentation

## 2019-07-07 DIAGNOSIS — E119 Type 2 diabetes mellitus without complications: Secondary | ICD-10-CM | POA: Insufficient documentation

## 2019-07-07 DIAGNOSIS — Z79899 Other long term (current) drug therapy: Secondary | ICD-10-CM | POA: Insufficient documentation

## 2019-07-07 DIAGNOSIS — Z91040 Latex allergy status: Secondary | ICD-10-CM | POA: Insufficient documentation

## 2019-07-07 DIAGNOSIS — J45909 Unspecified asthma, uncomplicated: Secondary | ICD-10-CM | POA: Insufficient documentation

## 2019-07-07 DIAGNOSIS — Z91018 Allergy to other foods: Secondary | ICD-10-CM | POA: Insufficient documentation

## 2019-07-07 LAB — COMPREHENSIVE METABOLIC PANEL
ALT: 12 U/L (ref 0–44)
AST: 14 U/L — ABNORMAL LOW (ref 15–41)
Albumin: 3.9 g/dL (ref 3.5–5.0)
Alkaline Phosphatase: 45 U/L (ref 38–126)
Anion gap: 7 (ref 5–15)
BUN: 8 mg/dL (ref 6–20)
CO2: 24 mmol/L (ref 22–32)
Calcium: 8.8 mg/dL — ABNORMAL LOW (ref 8.9–10.3)
Chloride: 106 mmol/L (ref 98–111)
Creatinine, Ser: 0.73 mg/dL (ref 0.44–1.00)
GFR calc Af Amer: >60 mL/min (ref >60)
GFR calc non Af Amer: >60 mL/min (ref >60)
Glucose, Bld: 102 mg/dL — ABNORMAL HIGH (ref 70–99)
Potassium: 3.5 mmol/L (ref 3.5–5.1)
Sodium: 137 mmol/L (ref 135–145)
Total Bilirubin: 0.2 mg/dL — ABNORMAL LOW (ref 0.3–1.2)
Total Protein: 7.7 g/dL (ref 6.5–8.1)

## 2019-07-07 LAB — CBC WITH DIFFERENTIAL/PLATELET
Abs Immature Granulocytes: 0.03 10*3/uL (ref 0.00–0.07)
Basophils Absolute: 0.1 10*3/uL (ref 0.0–0.1)
Basophils Relative: 1 %
Eosinophils Absolute: 0.1 10*3/uL (ref 0.0–0.5)
Eosinophils Relative: 2 %
HCT: 34.9 % — ABNORMAL LOW (ref 36.0–46.0)
Hemoglobin: 11 g/dL — ABNORMAL LOW (ref 12.0–15.0)
Immature Granulocytes: 0 %
Lymphocytes Relative: 26 %
Lymphs Abs: 2.4 10*3/uL (ref 0.7–4.0)
MCH: 30.5 pg (ref 26.0–34.0)
MCHC: 31.5 g/dL (ref 30.0–36.0)
MCV: 96.7 fL (ref 80.0–100.0)
Monocytes Absolute: 0.8 10*3/uL (ref 0.1–1.0)
Monocytes Relative: 8 %
Neutro Abs: 5.9 10*3/uL (ref 1.7–7.7)
Neutrophils Relative %: 63 %
Platelets: 321 10*3/uL (ref 150–400)
RBC: 3.61 MIL/uL — ABNORMAL LOW (ref 3.87–5.11)
RDW: 13 % (ref 11.5–15.5)
WBC: 9.2 10*3/uL (ref 4.0–10.5)
nRBC: 0 % (ref 0.0–0.2)

## 2019-07-07 NOTE — Progress Notes (Signed)
Left upper extremity venous duplex has been completed. Preliminary results can be found in CV Proc through chart review.  Results were given to Dr. Estell Harpin.  07/07/19 3:23 PM Olen Cordial RVT

## 2019-07-07 NOTE — Discharge Instructions (Addendum)
Tylenol for pain,   follow-up with Dr.XU if not improving.

## 2019-07-07 NOTE — ED Provider Notes (Signed)
[facility]-EMERGENCY DEPT Provider Note   CSN: [id number] Arrival date & time: 07/07/19  1430     History Chief Complaint  Patient presents with  . Arm Swelling    Cassandra White is a 34 y.o. female.  Patient complains of left arm pain.  She has been lifting a 15 pound baby but does not think that has anything to do with it.  She has got a history of DVTs and she says the arm is swollen and is concerned about blood clot to her arm  The history is provided by the patient. No language interpreter was used.  Arm Injury Location:  Arm Injury: yes   Mechanism of injury comment:  Lifting Pain details:    Quality:  Aching   Radiates to:  Does not radiate   Severity:  Moderate   Onset quality:  Sudden   Timing:  Constant   Progression:  Worsening Handedness:  Right-handed Dislocation: no   Associated symptoms: no back pain and no fatigue        Past Medical History:  Diagnosis Date  . Abnormal Pap smear    Last pap smear normal   . Asthma   . Breast disorder    lump in right breast  . Diabetes mellitus without complication (HCC)   . Edema of left lower extremity    Since age 34   . History of blood clots    right leg  . Obese     Patient Active Problem List   Diagnosis Date Noted  . Deep vein thrombosis (DVT) of right radial vein (HCC) 12/29/2016  . Possible exposure to STD 12/12/2011  . Fibrocystic breast changes   . LEG EDEMA, LEFT 09/14/2006  . OBESITY, NOS 08/16/2006    Past Surgical History:  Procedure Laterality Date  . BUNIONECTOMY     right foot  . COLPOSCOPY  2009/2010     OB History    Gravida  1   Para  1   Term      Preterm  1   AB  0   Living  0     SAB  0   TAB      Ectopic      Multiple      Live Births              Family History  Problem Relation Age of Onset  . Hypertension Mother   . Asthma Mother   . Pulmonary embolism Mother   . Deep vein thrombosis Mother   . Asthma Brother   .  Diabetes Father   . Cancer Maternal Uncle        lung  . Cancer Maternal Grandmother        brain  . Cancer Maternal Grandfather        pancreatic  . Diabetes Paternal Grandmother   . Diabetes Paternal Grandfather     Social History   Tobacco Use  . Smoking status: Former Smoker    Types: Cigarettes    Quit date: 01/04/2010    Years since quitting: 9.5  . Smokeless tobacco: Current User  Substance Use Topics  . Alcohol use: Yes    Comment: socially  . Drug use: No    Home Medications Prior to Admission medications   Medication Sig Start Date End Date Taking? Authorizing Provider  acetaminophen (TYLENOL) 325 MG tablet Take 650 mg by mouth every 6 (six) hours as needed for mild pain or headache.  [provider]  gabapentin (NEURONTIN) 100 MG capsule Take 100 mg by mouth 2 (two) times daily.    [provider]  Ibuprofen-Famotidine (DUEXIS PO) Take 1-2 tablets by mouth as needed (Pain).    [provider]  medroxyPROGESTERone (PROVERA) 10 MG tablet Take 10 mg by mouth daily.    [provider]  metFORMIN (GLUCOPHAGE) 500 MG tablet Take 1 tablet (500 mg total) by mouth 2 (two) times daily with a meal. Start with one pill a day for 2 weeks Patient not taking: Reported on 08/13/2017 12/26/16   Copland, Gay Filler, MD  Rivaroxaban 15 & 20 MG TBPK Take 15 mg by mouth 2 (two) times daily. Take as directed on package: Start with one 15mg  tablet by mouth twice a day with food. On Day 22, switch to one 20mg  tablet once a day with food. Patient not taking: Reported on 08/13/2017 12/19/16   Margarita Mail, PA-C  Vitamin D, Ergocalciferol, (DRISDOL) 50000 units CAPS capsule Take 50,000 Units by mouth every 7 (seven) days.    [provider]    Allergies    Aspirin, Latex, Other, and Avocado  Review of Systems   Review of Systems  Constitutional: Negative for appetite change and fatigue.  HENT: Negative for congestion, ear discharge and sinus  pressure.   Eyes: Negative for discharge.  Respiratory: Negative for cough.   Cardiovascular: Negative for chest pain.  Gastrointestinal: Negative for abdominal pain and diarrhea.  Genitourinary: Negative for frequency and hematuria.  Musculoskeletal: Negative for back pain.       Left arm pain and swelling  Skin: Negative for rash.  Neurological: Negative for seizures and headaches.  Psychiatric/Behavioral: Negative for hallucinations.    Physical Exam Updated Vital Signs Ht 5\' 8"  (1.727 m)   Wt (!) 160.6 kg   BMI 53.83 kg/m   Physical Exam Vitals and nursing note reviewed.  Constitutional:      Appearance: She is well-developed.  HENT:     Head: Normocephalic.     Nose: Nose normal.  Eyes:     Conjunctiva/sclera: Conjunctivae normal.  Neck:     Trachea: No tracheal deviation.  Cardiovascular:     Rate and Rhythm: Normal rate.     Heart sounds: No murmur.  Pulmonary:     Effort: Pulmonary effort is normal.  Musculoskeletal:        General: Normal range of motion.     Cervical back: Normal range of motion.     Comments: Mild swelling to humerus with tenderness  Skin:    General: Skin is warm.  Neurological:     Mental Status: She is oriented to person, place, and time.  Psychiatric:        Mood and Affect: Mood normal.     ED Results / Procedures / Treatments   Labs (all labs ordered are listed, but only abnormal results are displayed) Labs Reviewed  CBC WITH DIFFERENTIAL/PLATELET - Abnormal; Notable for the following components:      Result Value   RBC 3.61 (*)    Hemoglobin 11.0 (*)    HCT 34.9 (*)    All other components within normal limits  COMPREHENSIVE METABOLIC PANEL - Abnormal; Notable for the following components:   Glucose, Bld 102 (*)    Calcium 8.8 (*)    AST 14 (*)    Total Bilirubin 0.2 (*)    All other components within normal limits    EKG None  Radiology DG Humerus Left  Result Date: 07/07/2019 CLINICAL DATA:  34 year old  female with left upper extremity pain. EXAM: LEFT HUMERUS - 2+ VIEW COMPARISON:  Chest radiograph dated 07/23/2017. FINDINGS: Evaluation is somewhat limb soft tissue attenuation. There is no acute fracture or dislocation. The bones are well mineralized. No arthritic changes. The soft tissues are grossly unremarkable. IMPRESSION: Negative. Electronically Signed   By: Elgie Collard M.D.   On: 07/07/2019 15:41   UE Venous Duplex ([facility] and [facility] ONLY)  Result Date: 07/07/2019 UPPER VENOUS STUDY  Indications: Swelling Risk Factors: None identified. Limitations: Body habitus and poor ultrasound/tissue interface. Comparison Study: No prior studies. Performing Technologist: Chanda Busing RVT  Examination Guidelines: A complete evaluation includes B-mode imaging, spectral Doppler, color Doppler, and power Doppler as needed of all accessible portions of each vessel. Bilateral testing is considered an integral part of a complete examination. Limited examinations for reoccurring indications may be performed as noted.  Right Findings: +----------+------------+---------+-----------+----------+-------+ RIGHT     CompressiblePhasicitySpontaneousPropertiesSummary +----------+------------+---------+-----------+----------+-------+ Subclavian    Full       Yes       Yes                      +----------+------------+---------+-----------+----------+-------+  Left Findings: +----------+------------+---------+-----------+----------+-------+ LEFT      CompressiblePhasicitySpontaneousPropertiesSummary +----------+------------+---------+-----------+----------+-------+ IJV           Full       Yes       Yes                      +----------+------------+---------+-----------+----------+-------+ Subclavian    Full       Yes       Yes                      +----------+------------+---------+-----------+----------+-------+ Axillary      Full       Yes       Yes                       +----------+------------+---------+-----------+----------+-------+ Brachial      Full       Yes       Yes                      +----------+------------+---------+-----------+----------+-------+ Radial        Full                                          +----------+------------+---------+-----------+----------+-------+ Ulnar         Full                                          +----------+------------+---------+-----------+----------+-------+ Cephalic      Full                                          +----------+------------+---------+-----------+----------+-------+ Basilic       Full                                          +----------+------------+---------+-----------+----------+-------+  Summary:  Right: No evidence of thrombosis in the subclavian.  Left: No evidence of deep vein thrombosis in the upper extremity. No evidence of superficial vein thrombosis in the upper extremity.  *See table(s) above for measurements and observations.    Preliminary     Procedures Procedures (including critical care time)  Medications Ordered in ED Medications - No data to display  ED Course  I have reviewed the triage vital signs and the nursing notes.  Pertinent labs & imaging results that were available during my care of the patient were reviewed by me and considered in my medical decision making (see chart for details).    MDM Rules/Calculators/A&P                     Patient had a negative DVT on ultrasound.  X-rays and labs are okay.  Suspect muscle strain.  She is going to take Tylenol follow-up with orthopedics or family doctor if necessary  Final Clinical Impression(s) / ED Diagnoses Final diagnoses:  Pain of left upper extremity    Rx / DC Orders ED Discharge Orders    None       Bethann Berkshire, MD 07/07/19 1707

## 2019-07-07 NOTE — ED Triage Notes (Signed)
Patient c/o left arm swelling x 2 days. Patient has a history of blood clots.

## 2019-12-19 ENCOUNTER — Ambulatory Visit (INDEPENDENT_AMBULATORY_CARE_PROVIDER_SITE_OTHER): Payer: 59

## 2019-12-19 ENCOUNTER — Encounter: Payer: Self-pay | Admitting: Podiatry

## 2019-12-19 ENCOUNTER — Other Ambulatory Visit: Payer: Self-pay

## 2019-12-19 ENCOUNTER — Ambulatory Visit (INDEPENDENT_AMBULATORY_CARE_PROVIDER_SITE_OTHER): Payer: 59 | Admitting: Podiatry

## 2019-12-19 DIAGNOSIS — M21619 Bunion of unspecified foot: Secondary | ICD-10-CM | POA: Diagnosis not present

## 2019-12-19 DIAGNOSIS — M779 Enthesopathy, unspecified: Secondary | ICD-10-CM | POA: Diagnosis not present

## 2019-12-19 MED ORDER — DICLOFENAC SODIUM 75 MG PO TBEC
75.0000 mg | DELAYED_RELEASE_TABLET | Freq: Two times a day (BID) | ORAL | 2 refills | Status: DC
Start: 2019-12-19 — End: 2021-03-28

## 2019-12-23 NOTE — Progress Notes (Signed)
Subjective:   Patient ID: Cassandra White, female   DOB: 34 y.o.   MRN: [id number]   HPI Patient states she has had some chronic swelling in her right foot and she is getting pain in the joint and it is making it hard for her to ambulate comfortably.  States it is been going on for few months and she does not remember injury and she is obese which is complicating factor and does not smoke   Review of Systems  All other systems reviewed and are negative.       Objective:  Physical Exam Vitals and nursing note reviewed.  Constitutional:      Appearance: She is well-developed.  Pulmonary:     Effort: Pulmonary effort is normal.  Musculoskeletal:        General: Normal range of motion.  Skin:    General: Skin is warm.  Neurological:     Mental Status: She is alert.     Neurovascular status intact muscle strength adequate range of motion within normal limits.  Exquisite discomfort sinus tarsi right with inflammation fluid buildup and pain when I try to invert and evert her foot along with swelling of the right foot     Assessment:  Acute sinus tarsitis right along with chronic obesity with negative Homans' sign noted     Plan:  H&P reviewed condition and did sterile prep and injected the sinus tarsi right 3 mg Kenalog 5 mg Xylocaine and applied ankle compression stocking to take pressure off the foot along with elevation and patient will use this and be seen back in the next 3 weeks  X-rays indicate there is no signs of fracture or advanced arthritis currently with obesity again complicating factor

## 2020-01-08 ENCOUNTER — Ambulatory Visit (INDEPENDENT_AMBULATORY_CARE_PROVIDER_SITE_OTHER): Payer: 59 | Admitting: Podiatry

## 2020-01-08 ENCOUNTER — Encounter: Payer: Self-pay | Admitting: Podiatry

## 2020-01-08 ENCOUNTER — Other Ambulatory Visit: Payer: Self-pay

## 2020-01-08 VITALS — Temp 97.4°F

## 2020-01-08 DIAGNOSIS — M779 Enthesopathy, unspecified: Secondary | ICD-10-CM | POA: Diagnosis not present

## 2020-01-08 DIAGNOSIS — M778 Other enthesopathies, not elsewhere classified: Secondary | ICD-10-CM | POA: Diagnosis not present

## 2020-01-08 DIAGNOSIS — M21619 Bunion of unspecified foot: Secondary | ICD-10-CM | POA: Diagnosis not present

## 2020-01-12 NOTE — Progress Notes (Signed)
Subjective:   Patient ID: Cassandra White, female   DOB: 34 y.o.   MRN: [id number]   HPI Patient presents stating she seems to be somewhat better with reduced discomfort but states that at times the compression stocking can be too tight for her   ROS      Objective:  Physical Exam  Neurovascular status is intact negative Denna Haggard' sign noted with patient who is obese which is certainly a complicating factor with discomfort in the forefoot and subtalar joint bilateral but low-grade with mild edema which has improved with compression     Assessment:  Inflammatory condition with obesity is complicating factor along with chronic edema     Plan:  H&P reviewed condition I recommended the continuation of compression therapy anti-inflammatories elevation as needed with consideration for other treatment depending on response.  Discussed and encouraged weight loss

## 2020-01-23 DIAGNOSIS — M79676 Pain in unspecified toe(s): Secondary | ICD-10-CM

## 2020-01-28 ENCOUNTER — Other Ambulatory Visit: Payer: Self-pay

## 2020-01-28 ENCOUNTER — Encounter: Payer: Self-pay | Admitting: Podiatry

## 2020-01-28 ENCOUNTER — Ambulatory Visit (INDEPENDENT_AMBULATORY_CARE_PROVIDER_SITE_OTHER): Payer: 59

## 2020-01-28 ENCOUNTER — Ambulatory Visit (INDEPENDENT_AMBULATORY_CARE_PROVIDER_SITE_OTHER): Payer: 59 | Admitting: Podiatry

## 2020-01-28 VITALS — Temp 97.7°F

## 2020-01-28 DIAGNOSIS — M2012 Hallux valgus (acquired), left foot: Secondary | ICD-10-CM | POA: Diagnosis not present

## 2020-01-28 NOTE — Progress Notes (Signed)
Subjective:   Patient ID: Cassandra White, female   DOB: 34 y.o.   MRN: [id number]   HPI Patient presents stating that she is ready to have this foot fixed stating the bunions been very sore and she has been trying wider shoes she is tried soaks and she had the right one fixed and that was doing well and she needs this done on this foot   ROS      Objective:  Physical Exam  Neurovascular status intact with patient found to have a prominent left first metatarsal that is painful with redness around the head and soreness     Assessment:  HAV deformity left with pain     Plan:  Reviewed condition and x-ray today and discussed distal osteotomy.  Explained procedure risk and allowed patient to read consent form going over alternative treatments complications.  Patient wants surgery and signed consent form after extensive review and is scheduled for outpatient surgery understanding recovery can take upwards of 6 months and had boot dispensed as I want her to get used to it prior to the procedure so she could be balanced.  Encouraged her to call with questions concerns  X-ray indicates bunion deformity with elevation of the intermetatarsal angle left of approximate 15 degrees

## 2020-01-29 ENCOUNTER — Telehealth: Payer: Self-pay | Admitting: *Deleted

## 2020-01-29 NOTE — Telephone Encounter (Signed)
DOS 02/03/2020 Cassandra White - [phone] LEFT FOOT  Authorization is Required.  Case number is [id number].  I sent clinicals to [facility] and requested authorization for Cassandra White's surgery.     The surgery was authorized.  It is VALID FROM 02/03/2020 to 05/03/2020.

## 2020-02-02 ENCOUNTER — Telehealth: Payer: Self-pay | Admitting: Podiatry

## 2020-02-02 NOTE — Telephone Encounter (Signed)
I'm supposed to have sx tomorrow, but I just got off the phone with [facility]. They told me that my insurance wouldn't cover the sx and I would be responsible for $2,000 or at least a  % which is $600 some dollars. I was wondering if I could reschedule my sx to a later date, until I have the money. Please give me a call back. Thank you.

## 2020-02-02 NOTE — Telephone Encounter (Signed)
Called pt back about her sx scheduled for tomorrow. Asked if she wanted to go ahead and reschedule her sx or if she needed to call back. Pt requested to just cancel for now due to her insurance having a high deductible and the amount she would have to pay [facility]. Offered to schedule pt to come in to see Dr. Charlsie Merles for conservative treatment. Pt declined and said she will call back later this week to see if she needs to make an appointment. Told pt to call us if she needed anything.  I have cancelled pt's sx in both the sx book and on Dr. Beverlee Nims schedule in [place]. I have cancelled her postop visit and left a voicemail with Aram Beecham at [facility] to cxl the sx with them as well.

## 2020-02-03 MED ORDER — OXYCODONE-ACETAMINOPHEN 10-325 MG PO TABS: 1.0000 | ORAL_TABLET | ORAL | 0 refills | Status: DC | PRN

## 2020-02-03 MED ORDER — ONDANSETRON HCL 4 MG PO TABS: 4.0000 mg | ORAL_TABLET | Freq: Three times a day (TID) | ORAL | 0 refills | Status: DC | PRN

## 2020-02-03 NOTE — Addendum Note (Signed)
Addended by: Lenn Sink on: 02/03/2020 12:46 PM   Modules accepted: Orders

## 2020-02-03 NOTE — Addendum Note (Signed)
Addended by: Lenn Sink on: 02/03/2020 07:25 AM   Modules accepted: Orders

## 2020-02-04 NOTE — Telephone Encounter (Signed)
Would be possible to do in office but she may want to consider changing insurances next year to one that pays better for out patient surgery. I would consider doing in office but still would be expensive for patient

## 2020-02-11 ENCOUNTER — Encounter: Payer: 59 | Admitting: Podiatry

## 2020-06-22 ENCOUNTER — Encounter (HOSPITAL_COMMUNITY): Payer: Self-pay

## 2020-06-22 ENCOUNTER — Other Ambulatory Visit: Payer: Self-pay

## 2020-06-22 DIAGNOSIS — M791 Myalgia, unspecified site: Secondary | ICD-10-CM | POA: Diagnosis not present

## 2020-06-22 DIAGNOSIS — J029 Acute pharyngitis, unspecified: Secondary | ICD-10-CM | POA: Insufficient documentation

## 2020-06-22 DIAGNOSIS — R6883 Chills (without fever): Secondary | ICD-10-CM | POA: Insufficient documentation

## 2020-06-22 DIAGNOSIS — Z5321 Procedure and treatment not carried out due to patient leaving prior to being seen by health care provider: Secondary | ICD-10-CM | POA: Diagnosis not present

## 2020-06-22 DIAGNOSIS — Z20822 Contact with and (suspected) exposure to covid-19: Secondary | ICD-10-CM | POA: Diagnosis not present

## 2020-06-22 LAB — POC SARS CORONAVIRUS 2 AG -  ED: SARS Coronavirus 2 Ag: NEGATIVE

## 2020-06-22 NOTE — ED Triage Notes (Signed)
Patient arrived with complaints of generalized body aches, chills and a sore throat. Patient states she has had both vaccinations against covid-19. Symptoms started today.

## 2020-06-23 ENCOUNTER — Emergency Department (HOSPITAL_COMMUNITY)
Admission: EM | Admit: 2020-06-23 | Discharge: 2020-06-23 | Disposition: A | Discharge status: Home / Self Care | Payer: BC Managed Care – PPO | Attending: Emergency Medicine | Admitting: Emergency Medicine

## 2020-08-16 ENCOUNTER — Other Ambulatory Visit: Payer: Self-pay

## 2020-08-16 ENCOUNTER — Encounter (HOSPITAL_COMMUNITY): Payer: Self-pay | Admitting: Emergency Medicine

## 2020-08-16 ENCOUNTER — Emergency Department (HOSPITAL_COMMUNITY)
Admission: EM | Admit: 2020-08-16 | Discharge: 2020-08-16 | Disposition: A | Discharge status: Home / Self Care | Payer: BC Managed Care – PPO | Attending: Emergency Medicine | Admitting: Emergency Medicine

## 2020-08-16 ENCOUNTER — Emergency Department (HOSPITAL_COMMUNITY): Payer: BC Managed Care – PPO

## 2020-08-16 ENCOUNTER — Emergency Department (HOSPITAL_BASED_OUTPATIENT_CLINIC_OR_DEPARTMENT_OTHER): Payer: BC Managed Care – PPO

## 2020-08-16 DIAGNOSIS — J45909 Unspecified asthma, uncomplicated: Secondary | ICD-10-CM | POA: Diagnosis not present

## 2020-08-16 DIAGNOSIS — Z87891 Personal history of nicotine dependence: Secondary | ICD-10-CM | POA: Diagnosis not present

## 2020-08-16 DIAGNOSIS — Z91040 Latex allergy status: Secondary | ICD-10-CM | POA: Diagnosis not present

## 2020-08-16 DIAGNOSIS — Z853 Personal history of malignant neoplasm of breast: Secondary | ICD-10-CM | POA: Insufficient documentation

## 2020-08-16 DIAGNOSIS — M79631 Pain in right forearm: Secondary | ICD-10-CM

## 2020-08-16 DIAGNOSIS — M7989 Other specified soft tissue disorders: Secondary | ICD-10-CM | POA: Diagnosis not present

## 2020-08-16 LAB — BASIC METABOLIC PANEL
Anion gap: 8 (ref 5–15)
BUN: 12 mg/dL (ref 6–20)
CO2: 23 mmol/L (ref 22–32)
Calcium: 9.1 mg/dL (ref 8.9–10.3)
Chloride: 107 mmol/L (ref 98–111)
Creatinine, Ser: 0.79 mg/dL (ref 0.44–1.00)
GFR, Estimated: >60 mL/min (ref >60)
Glucose, Bld: 90 mg/dL (ref 70–99)
Potassium: 4 mmol/L (ref 3.5–5.1)
Sodium: 138 mmol/L (ref 135–145)

## 2020-08-16 LAB — CBC
HCT: 34.6 % — ABNORMAL LOW (ref 36.0–46.0)
Hemoglobin: 10.7 g/dL — ABNORMAL LOW (ref 12.0–15.0)
MCH: 29.7 pg (ref 26.0–34.0)
MCHC: 30.9 g/dL (ref 30.0–36.0)
MCV: 96.1 fL (ref 80.0–100.0)
Platelets: 353 10*3/uL (ref 150–400)
RBC: 3.6 MIL/uL — ABNORMAL LOW (ref 3.87–5.11)
RDW: 13.3 % (ref 11.5–15.5)
WBC: 8.3 10*3/uL (ref 4.0–10.5)
nRBC: 0 % (ref 0.0–0.2)

## 2020-08-16 LAB — APTT: aPTT: 32 seconds (ref 24–36)

## 2020-08-16 LAB — I-STAT BETA HCG BLOOD, ED (MC, WL, AP ONLY): I-stat hCG, quantitative: <5 m[IU]/mL (ref <5)

## 2020-08-16 MED ORDER — ACETAMINOPHEN 500 MG PO TABS: 1000.0000 mg | ORAL_TABLET | Freq: Once | ORAL | Status: DC

## 2020-08-16 MED ORDER — IBUPROFEN 400 MG PO TABS: 400.0000 mg | ORAL_TABLET | Freq: Once | ORAL | Status: DC

## 2020-08-16 NOTE — ED Notes (Signed)
Patient transported to X-ray 

## 2020-08-16 NOTE — Progress Notes (Signed)
Upper extremity venous has been completed.   Preliminary results in CV Proc.   Blanch Media 08/16/2020 3:15 PM

## 2020-08-16 NOTE — Discharge Instructions (Signed)
You may take Tylenol and NSAID at home every 6 hours for pain. You may also try wearing a removable velcro splint on your wrist. Please arrange appointment with PCP if symptoms do not improve.

## 2020-08-16 NOTE — ED Provider Notes (Signed)
[facility] Provider Note   CSN: [id number] Arrival date & time: 08/16/20  1344     History Chief Complaint  Patient presents with  . Possible DVT    Cassandra Fullingrica [place] is a 35 y.o. female w/ h/o previous DVT, PCOS, and obesity who presents to the ED for RUE pain and swelling. Pain came on gradually approximately 1 week ago and persistent since onset. She reports aching-like pain to her R forearm with associated swelling. Pain radiates to R palm. Denies erythema, warmth, reduced range of motion of wrist or elbow, or recent injury. Patient concerned for possible DVT for which she has had in her R arm previously. Previously on Xarelto, but no longer taking per her PCP.  The history is provided by the patient and medical records.  Extremity Pain This is a new problem. The current episode started more than 2 days ago. The problem occurs constantly. The problem has been gradually worsening. Pertinent negatives include no chest pain, no abdominal pain, no headaches and no shortness of breath. Exacerbated by: use of R arm. The symptoms are relieved by acetaminophen and rest. She has tried acetaminophen and rest for the symptoms. The treatment provided moderate relief.       Past Medical History:  Diagnosis Date  . Abnormal Pap smear    Last pap smear normal   . Asthma   . Breast disorder    lump in right breast  . Edema of left lower extremity    Since age 35   . History of blood clots    right leg  . Obese     Patient Active Problem List   Diagnosis Date Noted  . Deep vein thrombosis (DVT) of right radial vein (HCC) 12/29/2016  . Possible exposure to STD 12/12/2011  . Fibrocystic breast changes   . LEG EDEMA, LEFT 09/14/2006  . OBESITY, NOS 08/16/2006    Past Surgical History:  Procedure Laterality Date  . BUNIONECTOMY     right foot  . COLPOSCOPY  2009/2010     OB History    Gravida  1   Para  1   Term      Preterm  1   AB   0   Living  0     SAB  0   IAB      Ectopic      Multiple      Live Births              Family History  Problem Relation Age of Onset  . Hypertension Mother   . Asthma Mother   . Pulmonary embolism Mother   . Deep vein thrombosis Mother   . Asthma Brother   . Diabetes Father   . Cancer Maternal Uncle        lung  . Cancer Maternal Grandmother        brain  . Cancer Maternal Grandfather        pancreatic  . Diabetes Paternal Grandmother   . Diabetes Paternal Grandfather     Social History   Tobacco Use  . Smoking status: Former Smoker    Types: Cigarettes    Quit date: 01/04/2010    Years since quitting: 10.6  . Smokeless tobacco: Current User  Vaping Use  . Vaping Use: Never used  Substance Use Topics  . Alcohol use: Yes    Comment: socially  . Drug use: No    Home Medications Prior to Admission medications  Medication Sig Start Date End Date Taking? Authorizing Provider  diclofenac (VOLTAREN) 75 MG EC tablet Take 1 tablet (75 mg total) by mouth 2 (two) times daily. Patient not taking: Reported on 08/16/2020 12/19/19   Lenn Sink, DPM    Allergies    Aspirin, Latex, Other, and Avocado  Review of Systems   Review of Systems  Constitutional: Negative for chills and fever.  HENT: Negative for ear pain and sore throat.   Eyes: Negative for pain and visual disturbance.  Respiratory: Negative for cough and shortness of breath.   Cardiovascular: Negative for chest pain and palpitations.  Gastrointestinal: Negative for abdominal pain and vomiting.  Genitourinary: Negative for dysuria and hematuria.  Musculoskeletal: Negative for arthralgias and back pain.  Skin: Negative for color change and rash.  Neurological: Negative for seizures, syncope and headaches.  All other systems reviewed and are negative.   Physical Exam Updated Vital Signs BP 134/78   Pulse 75   Temp 98.2 F (36.8 C)   Resp 18   LMP 07/23/2020   SpO2 100%   Physical  Exam Vitals and nursing note reviewed.  Constitutional:      General: She is awake. She is not in acute distress.    Appearance: Normal appearance. She is well-developed, well-groomed and well-nourished. She is morbidly obese. She is not ill-appearing.  HENT:     Head: Normocephalic and atraumatic.     Right Ear: External ear normal.     Left Ear: External ear normal.     Nose: Nose normal.  Eyes:     General: No scleral icterus.       Right eye: No discharge.        Left eye: No discharge.     Conjunctiva/sclera: Conjunctivae normal.  Cardiovascular:     Rate and Rhythm: Normal rate and regular rhythm.  Pulmonary:     Effort: Pulmonary effort is normal. No respiratory distress.  Musculoskeletal:        General: No edema.     Cervical back: Neck supple.     Comments: Mild tenderness along R forearm without obvious deformity and with minimal swelling as compared to opposite extremity. No overlying skin changes and compartment soft. ROM of R elbow and R wrist preserved. Palpable 2+ R radial pulse.  Skin:    General: Skin is warm and dry.     Capillary Refill: Capillary refill takes less than 2 seconds.  Neurological:     General: No focal deficit present.     Mental Status: She is alert and oriented to person, place, and time.     Sensory: No sensory deficit.     Motor: No weakness.  Psychiatric:        Mood and Affect: Mood and affect and mood normal.        Behavior: Behavior normal. Behavior is cooperative.     ED Results / Procedures / Treatments   Labs (all labs ordered are listed, but only abnormal results are displayed) Labs Reviewed  CBC - Abnormal; Notable for the following components:      Result Value   RBC 3.60 (*)    Hemoglobin 10.7 (*)    HCT 34.6 (*)    All other components within normal limits  BASIC METABOLIC PANEL  APTT  I-STAT BETA HCG BLOOD, ED ([facility], [facility], [facility] ONLY)    EKG None  Radiology DG Forearm Right  Result Date: 08/16/2020 CLINICAL DATA:   Right forearm pain and swelling EXAM: RIGHT  FOREARM - 2 VIEW COMPARISON:  None. FINDINGS: Frontal and lateral views of the right forearm demonstrate no acute or destructive bony lesions. Alignment is anatomic. Joint spaces are well preserved. Soft tissues are normal. IMPRESSION: 1. Unremarkable right forearm. Electronically Signed   By: Sharlet Salina M.D.   On: 08/16/2020 17:36   UE VENOUS DUPLEX ([facility] & [facility] 7 am - 7 pm)  Result Date: 08/16/2020 UPPER VENOUS STUDY  Indications: Swelling Comparison Study: 07/07/19 previous Performing Technologist: Blanch Media RVS  Examination Guidelines: A complete evaluation includes B-mode imaging, spectral Doppler, color Doppler, and power Doppler as needed of all accessible portions of each vessel. Bilateral testing is considered an integral part of a complete examination. Limited examinations for reoccurring indications may be performed as noted.  Right Findings: +----------+------------+---------+-----------+----------+-------+ RIGHT     CompressiblePhasicitySpontaneousPropertiesSummary +----------+------------+---------+-----------+----------+-------+ IJV           Full       Yes       Yes                      +----------+------------+---------+-----------+----------+-------+ Subclavian    Full       Yes       Yes                      +----------+------------+---------+-----------+----------+-------+ Axillary      Full       Yes       Yes                      +----------+------------+---------+-----------+----------+-------+ Brachial      Full       Yes       Yes                      +----------+------------+---------+-----------+----------+-------+ Radial        Full                                          +----------+------------+---------+-----------+----------+-------+ Ulnar         Full                                          +----------+------------+---------+-----------+----------+-------+ Cephalic      Full                                           +----------+------------+---------+-----------+----------+-------+ Basilic       Full                                          +----------+------------+---------+-----------+----------+-------+  Summary:  Right: No evidence of deep vein thrombosis in the upper extremity. No evidence of superficial vein thrombosis in the upper extremity. No evidence of thrombosis in the subclavian.  *See table(s) above for measurements and observations.  Diagnosing physician: Lemar Livings MD Electronically signed by Lemar Livings MD on 08/16/2020 at 5:51:38 PM.    Final     Procedures Procedures  Medications Ordered in ED Medications - No data to display  ED  Course  I have reviewed the triage vital signs and the nursing notes.  Pertinent labs & imaging results that were available during my care of the patient were reviewed by me and considered in my medical decision making (see chart for details).    MDM Rules/Calculators/A&P                          Patient is a 34yoF with history and physical as described above who presents to the ED for R forearm pain and swelling. VS reassuring and HDS. Patient resting comfortably and in no acute distress. Initial workup started in triage and included duplex RUE [place] which was negative for DVT. Labs otherwise reassuring and pregnancy test negative. Additional workup includes R forearm plain film to assess for FB, occult fx, or underlying osseous malignancy. Initial treatment includes Tylenol and ibuprofen.  Plain films unremarkable. Doubt DVT, superficial thrombophlebitis, cellulitis, abscess, fracture, dislocation, thoracic outlet syndrome, or cervical radiculopathy at this time. Suspect presentation 2/2 muscle strain vs tendonitis vs peripheral nerve syndrome vs carpel nerve syndrome. Recommend she tries wearing removal wrist splint and taking Tylenol and NSAIDs for pain. Instructed her to follow up with PCP if symptoms persist for  further evaluation. Low suspicion for acute emergent pathology at this time. Strict return precautions provided and discussed. Questions and concerns addressed. Patient verbalized understanding and amenable with discharge plan. Discharged in stable condition.  Final Clinical Impression(s) / ED Diagnoses Final diagnoses:  Right forearm pain    Rx / DC Orders ED Discharge Orders    None       Tonia Brooms, MD 08/17/20 Estrella Myrtle, MD 08/26/20 1018

## 2020-08-16 NOTE — ED Triage Notes (Signed)
Patient coming from home. Complaint of possible blood clot in her right arm. States she has a history of clots but was taken off of xerelto by PCP d/t PCOS. Endorses pain and swelling that has gotten progressively worse over the last week.

## 2021-03-28 ENCOUNTER — Other Ambulatory Visit: Payer: Self-pay

## 2021-03-28 ENCOUNTER — Inpatient Hospital Stay (HOSPITAL_COMMUNITY): Payer: BC Managed Care – PPO

## 2021-03-28 ENCOUNTER — Inpatient Hospital Stay (HOSPITAL_COMMUNITY)
Admission: AD | Admit: 2021-03-28 | Discharge: 2021-03-28 | Disposition: A | Discharge status: Home / Self Care | Payer: BC Managed Care – PPO | Attending: Obstetrics and Gynecology | Admitting: Obstetrics and Gynecology

## 2021-03-28 ENCOUNTER — Encounter (HOSPITAL_COMMUNITY): Payer: Self-pay | Admitting: Obstetrics and Gynecology

## 2021-03-28 DIAGNOSIS — R109 Unspecified abdominal pain: Secondary | ICD-10-CM | POA: Diagnosis present

## 2021-03-28 DIAGNOSIS — O26891 Other specified pregnancy related conditions, first trimester: Secondary | ICD-10-CM

## 2021-03-28 DIAGNOSIS — Z87891 Personal history of nicotine dependence: Secondary | ICD-10-CM | POA: Insufficient documentation

## 2021-03-28 DIAGNOSIS — Z3A01 Less than 8 weeks gestation of pregnancy: Secondary | ICD-10-CM | POA: Diagnosis not present

## 2021-03-28 DIAGNOSIS — O26899 Other specified pregnancy related conditions, unspecified trimester: Secondary | ICD-10-CM

## 2021-03-28 DIAGNOSIS — N760 Acute vaginitis: Secondary | ICD-10-CM | POA: Diagnosis not present

## 2021-03-28 DIAGNOSIS — B9689 Other specified bacterial agents as the cause of diseases classified elsewhere: Secondary | ICD-10-CM

## 2021-03-28 DIAGNOSIS — O23591 Infection of other part of genital tract in pregnancy, first trimester: Secondary | ICD-10-CM | POA: Diagnosis not present

## 2021-03-28 LAB — CBC WITH DIFFERENTIAL/PLATELET
Abs Immature Granulocytes: 0.03 10*3/uL (ref 0.00–0.07)
Basophils Absolute: 0 10*3/uL (ref 0.0–0.1)
Basophils Relative: 0 %
Eosinophils Absolute: 0.1 10*3/uL (ref 0.0–0.5)
Eosinophils Relative: 1 %
HCT: 32.2 % — ABNORMAL LOW (ref 36.0–46.0)
Hemoglobin: 10.4 g/dL — ABNORMAL LOW (ref 12.0–15.0)
Immature Granulocytes: 0 %
Lymphocytes Relative: 24 %
Lymphs Abs: 2.2 10*3/uL (ref 0.7–4.0)
MCH: 30.5 pg (ref 26.0–34.0)
MCHC: 32.3 g/dL (ref 30.0–36.0)
MCV: 94.4 fL (ref 80.0–100.0)
Monocytes Absolute: 0.8 10*3/uL (ref 0.1–1.0)
Monocytes Relative: 9 %
Neutro Abs: 6 10*3/uL (ref 1.7–7.7)
Neutrophils Relative %: 66 %
Platelets: 365 10*3/uL (ref 150–400)
RBC: 3.41 MIL/uL — ABNORMAL LOW (ref 3.87–5.11)
RDW: 13.4 % (ref 11.5–15.5)
WBC: 9.1 10*3/uL (ref 4.0–10.5)
nRBC: 0 % (ref 0.0–0.2)

## 2021-03-28 LAB — COMPREHENSIVE METABOLIC PANEL
ALT: 10 U/L (ref 0–44)
AST: 15 U/L (ref 15–41)
Albumin: 3.6 g/dL (ref 3.5–5.0)
Alkaline Phosphatase: 42 U/L (ref 38–126)
Anion gap: 6 (ref 5–15)
BUN: 8 mg/dL (ref 6–20)
CO2: 22 mmol/L (ref 22–32)
Calcium: 9.1 mg/dL (ref 8.9–10.3)
Chloride: 106 mmol/L (ref 98–111)
Creatinine, Ser: 0.76 mg/dL (ref 0.44–1.00)
GFR, Estimated: >60 mL/min (ref >60)
Glucose, Bld: 93 mg/dL (ref 70–99)
Potassium: 3.3 mmol/L — ABNORMAL LOW (ref 3.5–5.1)
Sodium: 134 mmol/L — ABNORMAL LOW (ref 135–145)
Total Bilirubin: 0.8 mg/dL (ref 0.3–1.2)
Total Protein: 7.1 g/dL (ref 6.5–8.1)

## 2021-03-28 LAB — URINALYSIS, ROUTINE W REFLEX MICROSCOPIC
Bilirubin Urine: NEGATIVE
Glucose, UA: NEGATIVE mg/dL
Hgb urine dipstick: NEGATIVE
Ketones, ur: NEGATIVE mg/dL
Leukocytes,Ua: NEGATIVE
Nitrite: NEGATIVE
Protein, ur: NEGATIVE mg/dL
Specific Gravity, Urine: 1.019 (ref 1.005–1.030)
pH: 7 (ref 5.0–8.0)

## 2021-03-28 LAB — WET PREP, GENITAL
Sperm: NONE SEEN
Trich, Wet Prep: NONE SEEN
Yeast Wet Prep HPF POC: NONE SEEN

## 2021-03-28 LAB — HCG, QUANTITATIVE, PREGNANCY: hCG, Beta Chain, Quant, S: 54644 m[IU]/mL — ABNORMAL HIGH (ref <5)

## 2021-03-28 LAB — POCT PREGNANCY, URINE: Preg Test, Ur: POSITIVE — AB

## 2021-03-28 LAB — ABO/RH: ABO/RH(D): A POS

## 2021-03-28 MED ORDER — METRONIDAZOLE 500 MG PO TABS: 500.0000 mg | ORAL_TABLET | Freq: Two times a day (BID) | ORAL | 0 refills | Status: AC

## 2021-03-28 NOTE — MAU Note (Signed)
Presents with c/o cramping and spotting that began Saturday evening.  LMP 02/04/2021.  Reports +HPT.

## 2021-03-28 NOTE — MAU Provider Note (Signed)
History     CSN: [id number]  Arrival date and time: 03/28/21 1557   Event Date/Time   First Provider Initiated Contact with Patient 03/28/21 1725      Chief Complaint  Patient presents with   Spotting   Cramping   HPI  Ms.Cassandra White is a 35 y.o. female G2P0100 @[redacted]w[redacted]d  here with abdominal pain and spotting. She reports cramping in lower abdomen started on Saturday. The pain is constant. The pain feels a strong menstrual cycle. She reports spotting; the bleeding heavier yesterday. The bleeding is red. She does have to wear a pad.  She has not tried anything for the pain. The bleeding is minimal now.   OB History     Gravida  2   Para  1   Term      Preterm  1   AB  0   Living  0      SAB  0   IAB      Ectopic      Multiple      Live Births              Past Medical History:  Diagnosis Date   Abnormal Pap smear    Last pap smear normal    Asthma    Breast disorder    lump in right breast   Edema of left lower extremity    Since age 16    History of blood clots    right leg   Obese     Past Surgical History:  Procedure Laterality Date   BUNIONECTOMY     right foot   COLPOSCOPY  2009/2010    Family History  Problem Relation Age of Onset   Hypertension Mother    Asthma Mother    Pulmonary embolism Mother    Deep vein thrombosis Mother    Asthma Brother    Diabetes Father    Cancer Maternal Uncle        lung   Cancer Maternal Grandmother        brain   Cancer Maternal Grandfather        pancreatic   Diabetes Paternal Grandmother    Diabetes Paternal Grandfather     Social History   Tobacco Use   Smoking status: Former    Types: Cigarettes    Quit date: 01/04/2010    Years since quitting: 11.2   Smokeless tobacco: Current  Vaping Use   Vaping Use: Never used  Substance Use Topics   Alcohol use: Yes    Comment: socially   Drug use: No    Allergies:  Allergies  Allergen Reactions   Aspirin Other (See Comments)     Can not take due to ulcer   Latex Hives and Swelling   Other     cucumbers   Avocado Nausea Only    Medications Prior to Admission  Medication Sig Dispense Refill Last Dose   diclofenac (VOLTAREN) 75 MG EC tablet Take 1 tablet (75 mg total) by mouth 2 (two) times daily. (Patient not taking: Reported on 08/16/2020) 50 tablet 2    Results for orders placed or performed during the hospital encounter of 03/28/21 (from the past 48 hour(s))  Pregnancy, urine POC     Status: Abnormal   Collection Time: 03/28/21  4:13 PM  Result Value Ref Range   Preg Test, Ur POSITIVE (A) NEGATIVE    Comment:        THE SENSITIVITY OF THIS METHODOLOGY IS >24 mIU/mL  Urinalysis, Routine w reflex microscopic Urine, Clean Catch     Status: Abnormal   Collection Time: 03/28/21  4:20 PM  Result Value Ref Range   Color, Urine YELLOW YELLOW   APPearance HAZY (A) CLEAR   Specific Gravity, Urine 1.019 1.005 - 1.030   pH 7.0 5.0 - 8.0   Glucose, UA NEGATIVE NEGATIVE mg/dL   Hgb urine dipstick NEGATIVE NEGATIVE   Bilirubin Urine NEGATIVE NEGATIVE   Ketones, ur NEGATIVE NEGATIVE mg/dL   Protein, ur NEGATIVE NEGATIVE mg/dL   Nitrite NEGATIVE NEGATIVE   Leukocytes,Ua NEGATIVE NEGATIVE    Comment: Performed at [facility], [address] [place], [place] [address]  CBC with Differential/Platelet     Status: Abnormal   Collection Time: 03/28/21  5:35 PM  Result Value Ref Range   WBC 9.1 4.0 - 10.5 K/uL   RBC 3.41 (L) 3.87 - 5.11 MIL/uL   Hemoglobin 10.4 (L) 12.0 - 15.0 g/dL   HCT 25.8 (L) 52.7 - 78.2 %   MCV 94.4 80.0 - 100.0 fL   MCH 30.5 26.0 - 34.0 pg   MCHC 32.3 30.0 - 36.0 g/dL   RDW 42.3 53.6 - 14.4 %   Platelets 365 150 - 400 K/uL   nRBC 0.0 0.0 - 0.2 %   Neutrophils Relative % 66 %   Neutro Abs 6.0 1.7 - 7.7 K/uL   Lymphocytes Relative 24 %   Lymphs Abs 2.2 0.7 - 4.0 K/uL   Monocytes Relative 9 %   Monocytes Absolute 0.8 0.1 - 1.0 K/uL   Eosinophils Relative 1 %   Eosinophils Absolute  0.1 0.0 - 0.5 K/uL   Basophils Relative 0 %   Basophils Absolute 0.0 0.0 - 0.1 K/uL   Immature Granulocytes 0 %   Abs Immature Granulocytes 0.03 0.00 - 0.07 K/uL    Comment: Performed at [facility], [address] [place], [place] [address]  Comprehensive metabolic panel     Status: Abnormal   Collection Time: 03/28/21  5:35 PM  Result Value Ref Range   Sodium 134 (L) 135 - 145 mmol/L   Potassium 3.3 (L) 3.5 - 5.1 mmol/L   Chloride 106 98 - 111 mmol/L   CO2 22 22 - 32 mmol/L   Glucose, Bld 93 70 - 99 mg/dL    Comment: Glucose reference range applies only to samples taken after fasting for at least 8 hours.   BUN 8 6 - 20 mg/dL   Creatinine, Ser 0.86 0.44 - 1.00 mg/dL   Calcium 9.1 8.9 - 76.1 mg/dL   Total Protein 7.1 6.5 - 8.1 g/dL   Albumin 3.6 3.5 - 5.0 g/dL   AST 15 15 - 41 U/L   ALT 10 0 - 44 U/L   Alkaline Phosphatase 42 38 - 126 U/L   Total Bilirubin 0.8 0.3 - 1.2 mg/dL   GFR, Estimated >95 >09 mL/min    Comment: (NOTE) Calculated using the CKD-EPI Creatinine Equation (2021)    Anion gap 6 5 - 15    Comment: Performed at [facility], [address], [place], [place] [address]  ABO/Rh     Status: None   Collection Time: 03/28/21  5:45 PM  Result Value Ref Range   ABO/RH(D) A POS    No rh immune globuloin      NOT A RH IMMUNE GLOBULIN CANDIDATE, PT RH POSITIVE Performed at [facility], [address] [place], [place] [address]  Wet prep, genital     Status: Abnormal   Collection Time: 03/28/21  6:33 PM   Specimen: Vaginal  Result Value Ref Range   Yeast Wet Prep HPF POC NONE SEEN NONE SEEN   Trich, Wet Prep NONE SEEN NONE SEEN   Clue Cells Wet Prep HPF POC PRESENT (A) NONE SEEN   WBC, Wet Prep HPF POC MANY (A) NONE SEEN   Sperm NONE SEEN     Comment: Performed at [facility], [address] [place], [place] [address]    [place] Comp Less 14 Wks  Result Date: 03/28/2021 CLINICAL DATA:  Cramping, spotting EXAM: OBSTETRIC <14 WK  ULTRASOUND TECHNIQUE: Transabdominal ultrasound was performed for evaluation of the gestation as well as the maternal uterus and adnexal regions. COMPARISON:  None. FINDINGS: Intrauterine gestational sac: Single Yolk sac:  Visualized. Embryo:  Visualized. Cardiac Activity: Visualized. Heart Rate: 164 bpm MSD:    mm    w     d CRL:   12.4 mm   7 w 3 d                  [place] EDC: 11/11/2021 Subchorionic hemorrhage:  None visualized. Maternal uterus/adnexae: No adnexal mass or free fluid IMPRESSION: Seven week 5 day intrauterine pregnancy. Fetal heart rate 164 beats per minute. No acute maternal findings. Electronically Signed   By: Charlett Nose M.D.   On: 03/28/2021 18:30     Review of Systems  Gastrointestinal:  Positive for abdominal pain.  Genitourinary:  Positive for vaginal bleeding.  Physical Exam   Blood pressure 134/63, pulse 88, temperature 98.4 F (36.9 C), resp. rate 20, height 5\' 8"  (1.727 m), weight (!) 163.9 kg, last menstrual period 02/04/2021, SpO2 100 %.  Physical Exam Vitals and nursing note reviewed.  Constitutional:      General: She is not in acute distress.    Appearance: Normal appearance. She is not ill-appearing, toxic-appearing or diaphoretic.  Abdominal:     Palpations: Abdomen is soft.     Tenderness: There is no abdominal tenderness.  Genitourinary:    Comments: Cervix closed, thick, posterior No blood noted on exam glove Wet prep and GC collected by Rn  Musculoskeletal:        General: Normal range of motion.  Skin:    General: Skin is warm.  Neurological:     Mental Status: She is alert and oriented to person, place, and time.    MAU Course  Procedures None  MDM  A positive Blood type Wet prep & GC HIV, CBC, Hcg, ABO 02/06/2021 OB transvaginal    Assessment and Plan   A:  1. Bacterial vaginosis   2. Abdominal cramping affecting pregnancy   3. [redacted] weeks gestation of pregnancy      P:  Discharge home in stable condition Return to MAU if symptoms  worsen Rx: Flagyl Start OB care Prenatal vitamins daily Pelvic rest  Cassandra White, [place] I, NP 03/28/2021 9:00 PM

## 2021-03-29 LAB — GC/CHLAMYDIA PROBE AMP (~~LOC~~) NOT AT ARMC
Chlamydia: NEGATIVE
Comment: NEGATIVE
Comment: NORMAL
Neisseria Gonorrhea: NEGATIVE

## 2021-04-20 ENCOUNTER — Telehealth (INDEPENDENT_AMBULATORY_CARE_PROVIDER_SITE_OTHER): Payer: 59

## 2021-04-20 DIAGNOSIS — M5430 Sciatica, unspecified side: Secondary | ICD-10-CM | POA: Insufficient documentation

## 2021-04-20 DIAGNOSIS — O99891 Other specified diseases and conditions complicating pregnancy: Secondary | ICD-10-CM

## 2021-04-20 DIAGNOSIS — O0990 Supervision of high risk pregnancy, unspecified, unspecified trimester: Secondary | ICD-10-CM | POA: Insufficient documentation

## 2021-04-20 DIAGNOSIS — Z3A00 Weeks of gestation of pregnancy not specified: Secondary | ICD-10-CM

## 2021-04-20 DIAGNOSIS — M5432 Sciatica, left side: Secondary | ICD-10-CM

## 2021-04-20 DIAGNOSIS — Z86718 Personal history of other venous thrombosis and embolism: Secondary | ICD-10-CM

## 2021-04-20 NOTE — Patient Instructions (Signed)
AREA PEDIATRIC/FAMILY PRACTICE PHYSICIANS  Central/[place]outheast [place] ([address]) [facility] Chambliss, MD; Eniola, MD; Hale, MD; Hensel, MD; McDiarmid, MD; McIntyer, MD; Tykisha Areola, MD; Walden, MD [address] [place], [place] [address] [phone] Mon-Fri 8:30-12:30, 1:30-5:00 Providers come to see babies at [facility] Accepting Medicaid [facility] providers who accept newborns: Koirala, MD; Morrow, MD; Wolters, MD [address], [place], [place] [address] [phone] Mon-Fri 8:00-5:30 Babies seen by providers at [facility] Does NOT accept Medicaid Please call early in hospitalization for appointment (limited availability)  [facility] Mulberry, MD [address] [place], [place] [address] [phone] Mon, Tue, Thur, Fri 8:30-5:00, Wed 10:00-7:00 (closed 1-2pm) Babies seen by [facility] providers Accepting Medicaid Rubin - Pediatrician Rubin, MD [address], [place], [place] [address] [phone] Mon-Fri 8:30-5:00, Sat 8:30-12:00 Provider comes to see babies at [facility] Accepting Medicaid Must have been referred from current patients or contacted office prior to delivery [facility] ([facility]) Brown, MD; Chandler, MD; Ettefagh, MD; Grant, MD; Lester, MD; McCormick, MD; McQueen, MD; Prose, MD; Simha, MD; Stanley, MD; Stryffeler, NP; Tebben, NP [address], [place], [place] [address] [phone] Mon, Tue, Thur, Fri 8:30-5:30, Wed 9:30-5:30, Sat 8:30-12:30 Babies seen by [facility] providers Accepting Medicaid Only accepting infants of first-time parents or siblings of current patients Hospital discharge coordinator will make follow-up appointment Jack Amos [address], [place], [place]  [address] [phone]   Fax - [phone] [facility] [address], [place], [place]  [address] Phone - [phone]   Fax - [phone] Shilpa Gosrani [address], [place], [place]  [address] [phone]  East/[place]ortheast [place] ([address]) [facility] Bates, MD; Brassfield, MD; Cooper, Cox, MD; MD; Davis, MD; Dovico, MD; Ettefaugh, MD; Little, MD; Lowe, MD; Keiffer, MD; Melvin, MD; Sumner, MD; Franko, MD [address], [place], [place] [address] [phone] Mon-Fri 8:30-5:00 (extended evenings Mon-Thur as needed), Sat-Sun 10:00-1:00 Providers come to see babies at [facility] Accepting Medicaid for families of first-time babies and families with all children in the household age 3 and under. Must register with office prior to making appointment (M-F only). [facility] Henson, NP; Knapp, MD; Lalonde, MD; Tysinger, PA [address] [place], [place] [address] [phone] Mon-Fri 8:00-5:00 Babies seen by providers at [facility] Does NOT accept Medicaid/Commercial Insurance Only [facility] ([facility])  Artis, MD; Barnes, MD; Bratton, MD; Coccaro, MD; Lockett Gardner, MD; Kramer, MD; Marshall, MD; Netherton, MD; Poleto, MD; Skinner, MD [address] [place], [place] [address] [phone] Mon-Fri 8:30-5:30, Sat (Oct.-Mar.) 9:00-1:00 Babies seen by providers at [facility] Accepting Medicaid  [place] ([address]) [facility] Reid, MD; Warner, MD [address], [place], [place] [address] [phone] Mon-Fri 8:30-5:00, Sat 8:30-12:00 Providers come to see babies at [facility] Does NOT accept Medicaid [facility] Becker, PA; Hagler, MD; Scifres, PA; Sun, MD; Swayne, MD [address], [place], [place] [address] [phone] Mon-Fri 8:00-5:00 Babies seen by providers at [facility] Does NOT accept Medicaid Only accepting babies of parents who  are patients Please call early in hospitalization for appointment (limited availability) [facility] Clark, MD; Frye, MD; Kelleher, MD; Mack, NP; Miller, MD; O'Keller, MD; Patterson, NP; Pudlo, MD; Puzio, MD; Thomas, MD; Tucker, MD; Twiselton, MD [address], [place], [place] [address] [phone] Mon-Fri 8:00-5:00, Sat 9:00-12:00 Providers come to see babies at [facility] Does NOT accept Medicaid  [place] ([address]) [facility] providers accepting new patients: Brake, NP; Wharton, PA [address], [place], [place] [address] [phone] Mon-Fri 8:00-5:00 Babies seen by providers at [facility] Does NOT accept Medicaid Only accepting babies of parents who are patients Please call early in hospitalization for appointment (limited availability) [facility] Gay, MD; Quinlan, MD [address] [place], [place] [address] [phone] (press [phone] to schedule appointment) Mon-Fri 8:00-5:00 Providers come to see babies at [facility] Does NOT accept Medicaid [facility] Mazer, MD [address] [place], [place] [address] [phone] Mon-Fri 8:30-5:00 (lunch 12:30-1:00), extended hours by appointment only Wed 5:00-6:30 Babies seen by [facility] providers Accepting Medicaid [facility] Banks, MD; Jordan, MD; Koberlein, MD [address], [place], [place] [address] [phone] Mon-Fri 8:00-5:00 Babies seen by [facility] providers Does NOT accept Medicaid [facility] Parker, MD; Hunter, MD; Wallace, DO [address] [place], [place] [address] [phone] Mon-Fri 8:00-5:00 Babies seen by [facility] providers Does NOT accept Medicaid [facility] Brandon, PA; Brecken, PA; Christy, NP; Dees, MD; DeClaire, MD; DeWeese, MD; Hansen, NP; Mills, NP; Parrish, NP; Smoot, NP; Summer, MD; Vapne,  MD [address] [place], [place] [address] [phone] Mon-Fri 8:30-5:00, Sat 10:00-1:00 Providers come to see babies at [facility] Does NOT accept Medicaid Free prenatal information session Tuesdays at 4:45pm [facility] Bouska, MD; Gordon, PA; Jeffery, PA; Weber, PA [address] [place] [address] [phone] Mon-Fri 7:30-5:30 Babies seen by [facility] providers [facility] [address], [place], [place]  [address] [phone]   Fax - [phone]  [place] ([address] & [address]) [facility] [place] [address] [place], [place] [address] [phone] Mon-Thur 8:00-6:00 Providers come to see babies at [facility] Accepting Medicaid [facility] Anderson, NP; Badger, MD; Beal, PA; Spencer, PA [address] [place], [place] [address] [phone] Mon-Thur 7:30-7:30, Fri 7:30-4:30 Babies seen by [facility] providers Accepting Medicaid [facility] Agbuya, MD; Klett, NP; Romgoolam, MD [address], [place], [place] [address] [phone] Mon-Fri 8:30-5:00, Sat 8:30-12:00 Providers come to see babies at [facility] Accepting Medicaid Must have "Meet & Greet" appointment at office prior to delivery [facility] McCord, MD; Wallace, MD; Wood, MD [address], [place], [place] [address] [phone] Mon-Wed 8:00-6:00, Thur-Fri 8:00-5:00, Sat 9:00-12:00 Providers come to see babies at [facility] Does NOT accept Medicaid Only accepting siblings of current patients [facility]  [address], [place], [place]  [address] [phone]   Fax - [phone] [facility] [address], [place], [place]  [address] [phone]   Fax -  [phone]  [place]  ([address] & [address]) [facility] Cirigliano, DO; Matthews, DO [address] , [place] [address] [phone] Mon-Fri 7:00-5:00 Babies seen by [facility] providers Does NOT accept Medicaid [facility] Briscoe, MD; Howley, PA; Moreira, PA [address], [place],  [address] [phone] Mon-Fri 8:00-5:00 Babies seen by [facility] providers Accepting Medicaid [facility] Boyd, MD; Church, PA; Jones, NP; Osborn, PA [address], ,  [address] [phone] Mon-Fri 8:00-5:00 Babies seen by providers at [facility] Accepting Medicaid  [place] ([address]) [facility] [place] Wendling, DO [address] [place], [place] [address] [phone] Mon-Fri 8:00-5:00 Babies seen by [facility] providers Does NOT accept Medicaid Limited availability, please call early in hospitalization to schedule follow-up [facility] Calderon, PA; Cummings, MD; Dillard, MD; Martin, PA; Olson, MD; VanDeven, PA [address], [place], [place] [address] [phone] Mon-Fri 8:30-5:00, Sat 9:00-12:00 Babies seen by providers at [facility] Accepting Medicaid Please register online then schedule online or call office [email or web address] Hunter, NP; Kumar, MD; Martin Rogers, PA [address], [place], [place] [address] [phone] Mon-Fri 8:00-5:00 Babies seen by providers at [facility] Accepting Medicaid [facility] ([facility]) Rarden, MD; Kristi Fleenor, NP; West, MD [address], [place], [place] [address] [phone] Mon-Fri 8:00-5:30, Sat&Sun by appointment (phones open at  8:30) Babies seen by [facility] providers Accepting Medicaid Must be a first-time baby or sibling of current patient [facility] [place]  [address], [place], [place]  [address] [phone]   Fax - [phone]  [place] ([address]) [facility] Brown, PA; Cowen, PA; Rice, MD; Helton, PA; Spry, MD [address] [place], [place] [address] [phone] Mon-Thur 8:00-7:00, Fri 8:00-5:00, Sat 8:00-12:00, Sun 9:00-12:00 Babies seen by [facility] providers Accepting Medicaid [facility] Coe-Goins, MD; Marshall, MD; Pierre-Louis, MD [address] [place] [address], [place], [place] [address] [phone] Mon-Thur 8:00-5:00 Babies seen by providers at [facility] Accepting Medicaid [facility] [place] Bratton, MD; Coe-Goins, MD; Hayes, MD; Lewis, MD; List, MD; Lott, MD; Marshall, MD; Moran, MD; O'[place] Holberg, MD; Pierre-Louis, MD; Pitonzo, MD; Scholer, MD; Spangle, MD [address] [place], [place] [address] [phone] Mon-Fri 8:00-5:30, Sat (Oct.-Mar.) 9:00-1:00 Babies seen by providers at [facility] Accepting Medicaid Must fill out new patient packet, available online at [email or web address] [facility] ([facility]) Friddle, NP; Harris, NP; Kelly, NP; Logan, MD; Melvin, PA; Poth, MD; Ramadoss, MD; Stanton, NP [address], [place], [place] [address] [phone] Mon-Thur 8:00-5:30, Fri 8:00-5:00 Babies seen by providers at [facility] Accepting Medicaid  [place] ([address]) [facility] Dixon, PA; Canastota, MD; Pickard, MD; Tapia, PA [address], [place], [place] [address] [phone] Mon-Fri 8:00-5:00 Babies seen by providers at [facility] Accepting Medicaid   [place] ([address]) [facility] [place] Masneri, DO; Meyers, MD; Nelson, PA [address], [place], [place] [address] [phone] Mon-Fri 8:00-5:00 Babies seen by providers at [facility] Does NOT accept Medicaid Limited appointment availability, please call early in hospitalization  [facility] [place] Kunedd, DO; McGowen, MD [address], [place], [place] [address] [phone] Mon-Fri 8:00-5:00 Babies seen by [facility] providers Does NOT accept Medicaid [facility] [place] Cameron, MD; MacDonald, MD; Michaels, PA; Nayak, MD [address] [place] [address], [place],  [address] [phone] Mon-Fri 8:00-5:00 After hours clinic ([address] [place],  [address]) [phone] Mon-Fri 5:00-8:00, Sat 12:00-6:00, Sun 10:00-4:00 Babies seen by [facility] providers Accepting Medicaid [facility] [place] [address], [place], [place]  [address] [phone]   Fax - [phone]  [place] ([address]) [facility] [place] [facility] Andy, MD [address], [place], [place] [address] [phone] Mon-Fri 8:00-5:00 Babies seen by [facility] providers Does NOT accept Medicaid [facility] [place] ([facility] [place]) Eksir, MD [address], [place], [place] [address] [phone] Mon-Thur 8:00-7:00, Fri 8:00-5:00, Sat 8:00-12:00 Babies seen by providers at [facility] Accepting Medicaid - but does not have vaccinations in office (must be received elsewhere) Limited availability, please call early in hospitalization  [place] ([address]) [facility]  Charlene Flemming, MD [address], [place] [address] [phone]  Fax [phone]  [facility]  Kimberly Newton, MD, Annamarie Streilein, PA, Carla Hampton, PA [address] [place] [place], [place]  [address] [phone] [facility]  [address], [place], [place] [address] [phone] [address], [place], [place] [address] [phone] (West Office)  [facility] [address], [place], [place] [address] [phone] [facility] [address], [place], [place] [address] [phone] [facility] [address], [place], [place] [address] [phone] [facility] [address], [place], [place] [address] [phone] [facility] [address], [place], [place] [address] [phone] [facility] [address], [place], [place] [address] [phone] [facility]  [address], [place], [place] [address] [phone] Dr[facility] [address] [place], [place], [place] [address] [phone] [facility] [address], [place],  [address] [phone] [facility] [address], ,  [address] [phone]   At our [facility], we work as an integrated team, providing care to address both physical and emotional health. Your medical provider may refer you to see our Behavioral Health Clinician (BHC) on the same day you see your medical provider, as availability permits; often scheduled virtually at your convenience.  Our BHC is available to all patients, visits generally last between 20-30 minutes, but can be longer or shorter, depending on patient need. The BHC offers help with stress management, coping with symptoms of depression and anxiety, major life changes , sleep issues, changing risky behavior, grief and loss, life stress, working on personal life goals, and  behavioral health issues, as these all affect your overall health and wellness.  The BHC is NOT available for the following: FMLA paperwork, court-ordered evaluations, specialty assessments (custody or disability), letters to employers, or  obtaining certification for an emotional support animal. The BHC does not provide long-term therapy. You have the right to refuse integrated behavioral health services, or to reschedule to see the BHC at a later date.  Confidentiality exception: If it is suspected that a child or disabled adult is being abused or neglected, we are required by law to report that to either Child Protective Services or Adult Protective Services.  If you have a diagnosis of Bipolar affective disorder, Schizophrenia, or recurrent Major depressive disorder, we will recommend that you establish care with a psychiatrist, as these are lifelong, chronic conditions, and we want your overall emotional health and medications to be more closely monitored. If you anticipate needing extended maternity leave due to mental health issues postpartum, it it recommended you inform your medical provider, so we can put in a referral to a psychiatrist as soon as possible. The BHC is unable to recommend an extended maternity leave for mental health issues. Your medical provider   or BHC may refer you to a therapist for ongoing, traditional therapy, or to a psychiatrist, for medication management, if it would benefit your overall health. Depending on your insurance, you may have a copay or be charged a deductible, depending on your insurance, to see the BHC. If you are uninsured, it is recommended that you apply for financial assistance. (Forms may be requested at the front desk for in-person visits, via MyChart, or request a form during a virtual visit).  If you see the BHC more than 6 times, you will have to complete a comprehensive clinical assessment interview with the BHC to resume integrated services.  For virtual visits with the BHC, you must be physically in the state of [place] at the time of the visit. For example, if you live in [place], you will have to do an in-person visit with the BHC, and your out-of-state insurance may not cover  behavioral health services in [place]. If you are going out of the state or country for any reason, the BHC may see you virtually when you return to [place], but not while you are physically outside of [place].   

## 2021-04-20 NOTE — Progress Notes (Signed)
Agree with nurses's documentation of this patient's clinic encounter.  Glenard Keesling L, MD  

## 2021-04-20 NOTE — Progress Notes (Signed)
New OB Intake  I connected with  Cassandra White on 04/20/21 at  3:15 PM EDT by MyChart Video Visit and verified that I am speaking with the correct person using two identifiers. Nurse is located at [facility] and pt is located at work.  I discussed the limitations, risks, security and privacy concerns of performing an evaluation and management service by telephone and the availability of in person appointments. I also discussed with the patient that there may be a patient responsible charge related to this service. The patient expressed understanding and agreed to proceed.  I explained I am completing New OB Intake today. We discussed her EDD of 11/11/21 that is based on LMP of 02/04/21. Pt is G1/P0. I reviewed her allergies, medications, Medical/Surgical/OB history, and appropriate screenings. I informed her of [facility] services. Based on history, this is a/an  pregnancy complicated by DVT  .   Patient Active Problem List   Diagnosis Date Noted   Supervision of high risk pregnancy, antepartum 04/20/2021   Sciatica 04/20/2021   Deep vein thrombosis (DVT) of right radial vein (HCC) 12/29/2016   Possible exposure to STD 12/12/2011   Fibrocystic breast changes    LEG EDEMA, LEFT 09/14/2006   OBESITY, NOS 08/16/2006    Concerns addressed today  Delivery Plans:  Plans to deliver at [facility].   MyChart/Babyscripts MyChart access verified. I explained pt will have some visits in office and some virtually. Babyscripts instructions given and order placed. Patient verifies receipt of registration text/e-mail. Account successfully created and app downloaded.  Blood Pressure Cuff  Patient has private insurance; instructed to purchase blood pressure cuff and bring to first prenatal appt. Explained after first prenatal appt pt will check weekly and document in Babyscripts.  Weight scale: Patient    have weight scale. Weight scale ordered for patient to pick up form [facility].   Anatomy US Explained  first scheduled [place] will be around 19 weeks. Anatomy US scheduled for 06/16/21 at 10:15a. Pt notified to arrive at 10:00a.  Labs Discussed Avelina Laine genetic screening with patient. Would like both Panorama and Horizon drawn at new OB visit. Routine prenatal labs needed.  Covid Vaccine Patient has covid vaccine.   Mother/ Baby Dyad Candidate?    If yes, offer as possibility  Informed patient of [facility] website  and placed link in her AVS.   Social Determinants of Health Food Insecurity: Patient denies food insecurity. WIC Referral: Patient is not interested in referral to [facility].  Transportation: Patient denies transportation needs. Childcare: Discussed no children allowed at ultrasound appointments. Offered childcare services; patient declines childcare services at this time.  Send link to Pregnancy Navigators   Placed OB Box on problem list and updated  First visit review I reviewed new OB appt with pt. I explained she will have a pelvic exam, ob bloodwork with genetic screening, and PAP smear. Explained pt will be seen by Dr.Michael Ervin at first visit; encounter routed to appropriate provider. Explained that patient will be seen by pregnancy navigator following visit with provider. [facility] information placed in AVS.   Henrietta Dine, CMA 04/20/2021  3:54 PM

## 2021-04-20 NOTE — Progress Notes (Signed)
Pt states that she was on Blood thinners previously for her DVT but her PCP took her off. She states has not had a Blood clot since 2019.

## 2021-05-02 ENCOUNTER — Encounter: Payer: Self-pay | Admitting: Obstetrics and Gynecology

## 2021-05-02 ENCOUNTER — Telehealth: Payer: Self-pay

## 2021-05-02 ENCOUNTER — Ambulatory Visit (INDEPENDENT_AMBULATORY_CARE_PROVIDER_SITE_OTHER): Payer: 59 | Admitting: Obstetrics and Gynecology

## 2021-05-02 ENCOUNTER — Other Ambulatory Visit (HOSPITAL_COMMUNITY)
Admission: RE | Admit: 2021-05-02 | Discharge: 2021-05-02 | Disposition: A | Discharge status: Home / Self Care | Payer: BC Managed Care – PPO | Source: Ambulatory Visit | Attending: Obstetrics and Gynecology | Admitting: Obstetrics and Gynecology

## 2021-05-02 ENCOUNTER — Other Ambulatory Visit: Payer: Self-pay

## 2021-05-02 VITALS — BP 128/95 | HR 95 | Wt 367.1 lb

## 2021-05-02 DIAGNOSIS — Z23 Encounter for immunization: Secondary | ICD-10-CM | POA: Diagnosis not present

## 2021-05-02 DIAGNOSIS — O0990 Supervision of high risk pregnancy, unspecified, unspecified trimester: Secondary | ICD-10-CM | POA: Insufficient documentation

## 2021-05-02 DIAGNOSIS — Z658 Other specified problems related to psychosocial circumstances: Secondary | ICD-10-CM | POA: Diagnosis not present

## 2021-05-02 DIAGNOSIS — I82621 Acute embolism and thrombosis of deep veins of right upper extremity: Secondary | ICD-10-CM

## 2021-05-02 DIAGNOSIS — O99212 Obesity complicating pregnancy, second trimester: Secondary | ICD-10-CM | POA: Diagnosis not present

## 2021-05-02 DIAGNOSIS — O88212 Thromboembolism in pregnancy, second trimester: Secondary | ICD-10-CM | POA: Diagnosis not present

## 2021-05-02 DIAGNOSIS — N883 Incompetence of cervix uteri: Secondary | ICD-10-CM | POA: Diagnosis not present

## 2021-05-02 DIAGNOSIS — O3432 Maternal care for cervical incompetence, second trimester: Secondary | ICD-10-CM | POA: Diagnosis not present

## 2021-05-02 DIAGNOSIS — Z3A20 20 weeks gestation of pregnancy: Secondary | ICD-10-CM | POA: Diagnosis not present

## 2021-05-02 DIAGNOSIS — O10919 Unspecified pre-existing hypertension complicating pregnancy, unspecified trimester: Secondary | ICD-10-CM | POA: Diagnosis not present

## 2021-05-02 DIAGNOSIS — O10912 Unspecified pre-existing hypertension complicating pregnancy, second trimester: Secondary | ICD-10-CM | POA: Diagnosis not present

## 2021-05-02 DIAGNOSIS — Z3A19 19 weeks gestation of pregnancy: Secondary | ICD-10-CM | POA: Diagnosis not present

## 2021-05-02 LAB — CYTOLOGY - PAP: Pap: NEGATIVE

## 2021-05-02 MED ORDER — ENOXAPARIN SODIUM 40 MG/0.4ML IJ SOSY: 40.0000 mg | PREFILLED_SYRINGE | INTRAMUSCULAR | 12 refills | Status: DC

## 2021-05-02 NOTE — Patient Instructions (Signed)
First Trimester of Pregnancy °The first trimester of pregnancy starts on the first day of your last menstrual period until the end of week 12. This is months 1 through 3 of pregnancy. A week after a sperm fertilizes an egg, the egg will implant into the wall of the uterus and begin to develop into a baby. By the end of 12 weeks, all the baby's organs will be formed and the baby will be 2-3 inches in size. °Body changes during your first trimester °Your body goes through many changes during pregnancy. The changes vary and generally return to normal after your baby is born. °Physical changes °You may gain or lose weight. °Your breasts may begin to grow larger and become tender. The tissue that surrounds your nipples (areola) may become darker. °Dark spots or blotches (chloasma or mask of pregnancy) may develop on your face. °You may have changes in your hair. These can include thickening or thinning of your hair or changes in texture. °Health changes °You may feel nauseous, and you may vomit. °You may have heartburn. °You may develop headaches. °You may develop constipation. °Your gums may bleed and may be sensitive to brushing and flossing. °Other changes °You may tire easily. °You may urinate more often. °Your menstrual periods will stop. °You may have a loss of appetite. °You may develop cravings for certain kinds of food. °You may have changes in your emotions from day to day. °You may have more vivid and strange dreams. °Follow these instructions at home: °Medicines °Follow your health care provider's instructions regarding medicine use. Specific medicines may be either safe or unsafe to take during pregnancy. Do not take any medicines unless told to by your health care provider. °Take a prenatal vitamin that contains at least 600 micrograms (mcg) of folic acid. °Eating and drinking °Eat a healthy diet that includes fresh fruits and vegetables, whole grains, good sources of protein such as meat, eggs, or tofu,  and low-fat dairy products. °Avoid raw meat and unpasteurized juice, milk, and cheese. These carry germs that can harm you and your baby. °If you feel nauseous or you vomit: °Eat 4 or 5 small meals a day instead of 3 large meals. °Try eating a few soda crackers. °Drink liquids between meals instead of during meals. °You may need to take these actions to prevent or treat constipation: °Drink enough fluid to keep your urine pale yellow. °Eat foods that are high in fiber, such as beans, whole grains, and fresh fruits and vegetables. °Limit foods that are high in fat and processed sugars, such as fried or sweet foods. °Activity °Exercise only as directed by your health care provider. Most people can continue their usual exercise routine during pregnancy. Try to exercise for 30 minutes at least 5 days a week. °Stop exercising if you develop pain or cramping in the lower abdomen or lower back. °Avoid exercising if it is very hot or humid or if you are at high altitude. °Avoid heavy lifting. °If you choose to, you may have sex unless your health care provider tells you not to. °Relieving pain and discomfort °Wear a good support bra to relieve breast tenderness. °Rest with your legs elevated if you have leg cramps or low back pain. °If you develop bulging veins (varicose veins) in your legs: °Wear support hose as told by your health care provider. °Elevate your feet for 15 minutes, 3-4 times a day. °Limit salt in your diet. °Safety °Wear your seat belt at all times when driving   or riding in a car. °Talk with your health care provider if someone is verbally or physically abusive to you. °Talk with your health care provider if you are feeling sad or have thoughts of hurting yourself. °Lifestyle °Do not use hot tubs, steam rooms, or saunas. °Do not douche. Do not use tampons or scented sanitary pads. °Do not use herbal remedies, alcohol, illegal drugs, or medicines that are not approved by your health care provider. Chemicals  in these products can harm your baby. °Do not use any products that contain nicotine or tobacco, such as cigarettes, e-cigarettes, and chewing tobacco. If you need help quitting, ask your health care provider. °Avoid cat litter boxes and soil used by cats. These carry germs that can cause birth defects in the baby and possibly loss of the unborn baby (fetus) by miscarriage or stillbirth. °General instructions °During routine prenatal visits in the first trimester, your health care provider will do a physical exam, perform necessary tests, and ask you how things are going. Keep all follow-up visits. This is important. °Ask for help if you have counseling or nutritional needs during pregnancy. Your health care provider can offer advice or refer you to specialists for help with various needs. °Schedule a dentist appointment. At home, brush your teeth with a soft toothbrush. Floss gently. °Write down your questions. Take them to your prenatal visits. °Where to find more information °American Pregnancy Association: americanpregnancy.org °American College of Obstetricians and Gynecologists: acog.org/en/Womens%20Health/Pregnancy °Office on Women's Health: womenshealth.gov/pregnancy °Contact a health care provider if you have: °Dizziness. °A fever. °Mild pelvic cramps, pelvic pressure, or nagging pain in the abdominal area. °Nausea, vomiting, or diarrhea that lasts for 24 hours or longer. °A bad-smelling vaginal discharge. °Pain when you urinate. °Known exposure to a contagious illness, such as chickenpox, measles, Zika virus, HIV, or hepatitis. °Get help right away if you have: °Spotting or bleeding from your vagina. °Severe abdominal cramping or pain. °Shortness of breath or chest pain. °Any kind of trauma, such as from a fall or a car crash. °New or increased pain, swelling, or redness in an arm or leg. °Summary °The first trimester of pregnancy starts on the first day of your last menstrual period until the end of week  12 (months 1 through 3). °Eating 4 or 5 small meals a day rather than 3 large meals may help to relieve nausea and vomiting. °Do not use any products that contain nicotine or tobacco, such as cigarettes, e-cigarettes, and chewing tobacco. If you need help quitting, ask your health care provider. °Keep all follow-up visits. This is important. °This information is not intended to replace advice given to you by your health care provider. Make sure you discuss any questions you have with your health care provider. °Document Revised: 11/12/2019 Document Reviewed: 09/18/2019 °Elsevier Patient Education © 2022 Elsevier Inc. ° °

## 2021-05-02 NOTE — Addendum Note (Signed)
Addended by: Hulda Marin C on: 05/02/2021 12:46 PM   Modules accepted: Orders

## 2021-05-02 NOTE — Progress Notes (Signed)
Subjective:  Cassandra White is a 35 y.o. G1P0000 at 64w3dbeing seen today for her first OB appt. EDD by LMP. H/O DVT 2013 and 2018.  She is currently monitored for the following issues for this high-risk pregnancy and has OBESITY, NOS; LEG EDEMA, LEFT; Fibrocystic breast changes; Deep vein thrombosis (DVT) of right radial vein ([place]; Supervision of high risk pregnancy, antepartum; and Sciatica on their problem list.  Patient reports no complaints.  Contractions: Not present. Vag. Bleeding: None.   . Denies leaking of fluid.   The following portions of the patient's history were reviewed and updated as appropriate: allergies, current medications, past family history, past medical history, past social history, past surgical history and problem list. Problem list updated.  Objective:   Vitals:   05/02/21 1024  BP: (!) 128/95  Pulse: 95  Weight: (!) 367 lb 1.6 oz (166.5 kg)    Fetal Status: Fetal Heart Rate (bpm): 132         General:  Alert, oriented and cooperative. Patient is in no acute distress.  Skin: Skin is warm and dry. No rash noted.   Cardiovascular: Normal heart rate noted  Respiratory: Normal respiratory effort, no problems with respiration noted  Abdomen: Soft, gravid, appropriate for gestational age. Pain/Pressure: Present     Pelvic:  Cervical exam performed        Extremities: Normal range of motion.  Edema: None  Mental Status: Normal mood and affect. Normal behavior. Normal judgment and thought content.   Urinalysis:      Assessment and Plan:  Pregnancy: G1P0000 at 135w3d1. Supervision of high risk pregnancy, antepartum Prenatal care and labs reviewed with pt Genetic testing discussed - Cytology - PAP( [place]) - Genetic Screening - CBC/D/Plt+RPR+Rh+ABO+RubIgG... - Culture, OB Urine - Comp Met (CMET) - Protein / creatinine ratio, urine - HgB A1c  2. Deep vein thrombosis (DVT) of radial vein of right upper extremity, unspecified chronicity  (HCAlta SierraDiscussed with pt. Referral to Hem/Onc after pregnancy for additional work Lovenox reviewed with pt. Pt to pick up Rx and then schedule nurse training for injection.  - Factor 5 leiden - enoxaparin (LOVENOX) 40 MG/0.4ML injection; Inject 0.4 mLs (40 mg total) into the skin daily.  Dispense: 10 mL; Refill: 12  Preterm labor symptoms and general obstetric precautions including but not limited to vaginal bleeding, contractions, leaking of fluid and fetal movement were reviewed in detail with the patient. Please refer to After Visit Summary for other counseling recommendations.  Return in about 4 weeks (around 05/30/2021) for OB visit, face to face, MD only.   ErChancy MilroyMD

## 2021-05-02 NOTE — Telephone Encounter (Signed)
Breast pump order signed and faxed to [facility].  Confirmation received.

## 2021-05-03 ENCOUNTER — Telehealth: Payer: Self-pay

## 2021-05-03 LAB — CBC/D/PLT+RPR+RH+ABO+RUBIGG...
Antibody Screen: NEGATIVE
Basophils Absolute: 0 10*3/uL (ref 0.0–0.2)
Basos: 0 %
EOS (ABSOLUTE): 0.1 10*3/uL (ref 0.0–0.4)
Eos: 1 %
HCV Ab: <0.1 s/co ratio (ref 0.0–0.9)
HIV Screen 4th Generation wRfx: NONREACTIVE
Hematocrit: 31.7 % — ABNORMAL LOW (ref 34.0–46.6)
Hemoglobin: 11 g/dL — ABNORMAL LOW (ref 11.1–15.9)
Hepatitis B Surface Ag: NEGATIVE
Immature Grans (Abs): 0 10*3/uL (ref 0.0–0.1)
Immature Granulocytes: 0 %
Lymphocytes Absolute: 1.6 10*3/uL (ref 0.7–3.1)
Lymphs: 18 %
MCH: 31.5 pg (ref 26.6–33.0)
MCHC: 34.7 g/dL (ref 31.5–35.7)
MCV: 91 fL (ref 79–97)
Monocytes Absolute: 0.7 10*3/uL (ref 0.1–0.9)
Monocytes: 8 %
Neutrophils Absolute: 6.2 10*3/uL (ref 1.4–7.0)
Neutrophils: 73 %
Platelets: 357 10*3/uL (ref 150–450)
RBC: 3.49 x10E6/uL — ABNORMAL LOW (ref 3.77–5.28)
RDW: 13 % (ref 11.7–15.4)
RPR Ser Ql: NONREACTIVE
Rh Factor: POSITIVE
Rubella Antibodies, IGG: 2.76 index (ref >0.99)
WBC: 8.6 10*3/uL (ref 3.4–10.8)

## 2021-05-03 LAB — POCT URINALYSIS DIP (DEVICE)
Bilirubin Urine: NEGATIVE
Glucose, UA: NEGATIVE mg/dL
Hgb urine dipstick: NEGATIVE
Ketones, ur: NEGATIVE mg/dL
Leukocytes,Ua: NEGATIVE
Nitrite: NEGATIVE
Protein, ur: NEGATIVE mg/dL
Specific Gravity, Urine: >=1.03 (ref 1.005–1.030)
Urobilinogen, UA: 0.2 mg/dL (ref 0.0–1.0)
pH: 6 (ref 5.0–8.0)

## 2021-05-03 LAB — PROTEIN / CREATININE RATIO, URINE
Creatinine, Urine: 252.9 mg/dL
Protein, Ur: 15.2 mg/dL
Protein/Creat Ratio: 60 mg/g creat (ref 0–200)

## 2021-05-03 LAB — HCV INTERPRETATION

## 2021-05-03 NOTE — Telephone Encounter (Signed)
Breast Pump order signed and faxed back to [employer].  Confirmation received.

## 2021-05-04 ENCOUNTER — Encounter: Payer: Self-pay | Admitting: *Deleted

## 2021-05-04 LAB — URINE CULTURE, OB REFLEX

## 2021-05-04 LAB — CULTURE, OB URINE

## 2021-05-07 NOTE — Telephone Encounter (Signed)
Baby[employer]s called with elevated BP in the 120/90 range similar to New OB--no symptoms. Will monitor.

## 2021-05-09 LAB — COMPREHENSIVE METABOLIC PANEL
ALT: 8 IU/L (ref 0–32)
AST: 13 IU/L (ref 0–40)
Albumin/Globulin Ratio: 1.5 (ref 1.2–2.2)
Albumin: 4.2 g/dL (ref 3.8–4.8)
Alkaline Phosphatase: 49 IU/L (ref 44–121)
BUN/Creatinine Ratio: 13 (ref 9–23)
BUN: 8 mg/dL (ref 6–20)
Bilirubin Total: 0.3 mg/dL (ref 0.0–1.2)
CO2: 21 mmol/L (ref 20–29)
Calcium: 9.5 mg/dL (ref 8.7–10.2)
Chloride: 102 mmol/L (ref 96–106)
Creatinine, Ser: 0.61 mg/dL (ref 0.57–1.00)
Globulin, Total: 2.8 g/dL (ref 1.5–4.5)
Glucose: 88 mg/dL (ref 70–99)
Potassium: 3.9 mmol/L (ref 3.5–5.2)
Sodium: 137 mmol/L (ref 134–144)
Total Protein: 7 g/dL (ref 6.0–8.5)
eGFR: 121 mL/min/{1.73_m2} (ref >59)

## 2021-05-09 LAB — HEMOGLOBIN A1C
Est. average glucose Bld gHb Est-mCnc: 103 mg/dL
Hgb A1c MFr Bld: 5.2 % (ref 4.8–5.6)

## 2021-05-09 LAB — FACTOR 5 LEIDEN

## 2021-05-10 ENCOUNTER — Other Ambulatory Visit: Payer: Self-pay

## 2021-05-10 ENCOUNTER — Other Ambulatory Visit (HOSPITAL_COMMUNITY)
Admission: RE | Admit: 2021-05-10 | Discharge: 2021-05-10 | Disposition: A | Discharge status: Home / Self Care | Payer: BC Managed Care – PPO | Source: Ambulatory Visit | Attending: Family Medicine | Admitting: Family Medicine

## 2021-05-10 ENCOUNTER — Ambulatory Visit (INDEPENDENT_AMBULATORY_CARE_PROVIDER_SITE_OTHER): Payer: 59

## 2021-05-10 VITALS — BP 121/74 | HR 85 | Wt 370.1 lb

## 2021-05-10 DIAGNOSIS — O0990 Supervision of high risk pregnancy, unspecified, unspecified trimester: Secondary | ICD-10-CM

## 2021-05-10 DIAGNOSIS — N898 Other specified noninflammatory disorders of vagina: Secondary | ICD-10-CM

## 2021-05-10 NOTE — Progress Notes (Signed)
Patient seen today for Lovenox education. Verbal education provided with teach back from patient. Patient self administered daily dose of Lovenox with guidance. Patient verbalized understanding and denied any questions.   Patient stated she had concern of a malodorous discharge. Patient denied itching or burning with urination. Educated patient on self swab. Patient informed she will be contacted with any abnormal results.   Patient initial BP reading 160/101, asymptomatic. BP Cuff was noted to be snug and inflated twice to get reading. Rechecked BP with a larger cuff size and found to be 121/74. Reviewed with Dr. Annia Friendly, provider recommended patient continue to check BP at home 1-2 times a week, continue to input BP into babyscripts app and call the office with the development of any symptoms. Patient denies any other questions/concerns.   Alesia Richards, RN

## 2021-05-11 LAB — CERVICOVAGINAL ANCILLARY ONLY
Bacterial Vaginitis (gardnerella): POSITIVE — AB
Candida Glabrata: NEGATIVE
Candida Vaginitis: NEGATIVE
Chlamydia: NEGATIVE
Comment: NEGATIVE
Comment: NEGATIVE
Comment: NEGATIVE
Comment: NEGATIVE
Comment: NEGATIVE
Comment: NORMAL
Neisseria Gonorrhea: NEGATIVE
Trichomonas: NEGATIVE

## 2021-05-11 NOTE — Progress Notes (Signed)
Patient was evaluated by nursing staff. Agree with assessment and plan.  °

## 2021-05-12 ENCOUNTER — Encounter: Payer: Self-pay | Admitting: Obstetrics and Gynecology

## 2021-05-16 ENCOUNTER — Other Ambulatory Visit: Payer: Self-pay

## 2021-05-16 DIAGNOSIS — B9689 Other specified bacterial agents as the cause of diseases classified elsewhere: Secondary | ICD-10-CM

## 2021-05-16 DIAGNOSIS — N760 Acute vaginitis: Secondary | ICD-10-CM

## 2021-05-16 MED ORDER — METRONIDAZOLE 0.75 % VA GEL
1.0000 | Freq: Two times a day (BID) | VAGINAL | 0 refills | Status: DC
Start: 2021-05-16 — End: 2021-05-16

## 2021-05-16 MED ORDER — METRONIDAZOLE 500 MG PO TABS: 500.0000 mg | ORAL_TABLET | Freq: Two times a day (BID) | ORAL | 0 refills | Status: DC

## 2021-06-02 ENCOUNTER — Other Ambulatory Visit: Payer: Self-pay

## 2021-06-02 ENCOUNTER — Telehealth: Payer: Self-pay | Admitting: Licensed Clinical Social Worker

## 2021-06-02 ENCOUNTER — Encounter: Payer: Self-pay | Admitting: Obstetrics and Gynecology

## 2021-06-02 ENCOUNTER — Ambulatory Visit (INDEPENDENT_AMBULATORY_CARE_PROVIDER_SITE_OTHER): Payer: 59 | Admitting: Obstetrics and Gynecology

## 2021-06-02 VITALS — BP 131/75 | HR 98 | Wt 376.3 lb

## 2021-06-02 DIAGNOSIS — O10919 Unspecified pre-existing hypertension complicating pregnancy, unspecified trimester: Secondary | ICD-10-CM

## 2021-06-02 DIAGNOSIS — Z3A16 16 weeks gestation of pregnancy: Secondary | ICD-10-CM

## 2021-06-02 DIAGNOSIS — Z7901 Long term (current) use of anticoagulants: Secondary | ICD-10-CM

## 2021-06-02 DIAGNOSIS — O0990 Supervision of high risk pregnancy, unspecified, unspecified trimester: Secondary | ICD-10-CM

## 2021-06-02 DIAGNOSIS — O99210 Obesity complicating pregnancy, unspecified trimester: Secondary | ICD-10-CM | POA: Insufficient documentation

## 2021-06-02 DIAGNOSIS — I82621 Acute embolism and thrombosis of deep veins of right upper extremity: Secondary | ICD-10-CM

## 2021-06-02 DIAGNOSIS — Z6841 Body Mass Index (BMI) 40.0 and over, adult: Secondary | ICD-10-CM

## 2021-06-02 DIAGNOSIS — Z8711 Personal history of peptic ulcer disease: Secondary | ICD-10-CM | POA: Insufficient documentation

## 2021-06-02 HISTORY — DX: Unspecified pre-existing hypertension complicating pregnancy, unspecified trimester: O10.919

## 2021-06-02 MED ORDER — ENOXAPARIN SODIUM 40 MG/0.4ML IJ SOSY: 40.0000 mg | PREFILLED_SYRINGE | Freq: Two times a day (BID) | INTRAMUSCULAR | 6 refills | Status: DC

## 2021-06-02 NOTE — Progress Notes (Signed)
Pt states a lot of pressure in vagina area,she wants toknow if it's due to her sitting a lot maybe??

## 2021-06-02 NOTE — Telephone Encounter (Signed)
Called pt regarding [facility] referral. Unable to leave message due to mailbox is full.

## 2021-06-04 NOTE — Progress Notes (Signed)
PRENATAL VISIT NOTE  Subjective:  Cassandra White is a 35 y.o. G1P0000 at [redacted]w[redacted]d being seen today for ongoing prenatal care.  She is currently monitored for the following issues for this high-risk pregnancy and has BMI 50.0-59.9, adult (HCC); Deep vein thrombosis (DVT) of right radial vein (HCC); Supervision of high risk pregnancy, antepartum; Sciatica; Anticoagulant long-term use; Obesity in pregnancy; Chronic hypertension during pregnancy, antepartum; and History of gastric ulcer on their problem list.  Patient reports  see RN note .  Contractions: Not present. Vag. Bleeding: None.  Movement: Absent. Denies leaking of fluid.   The following portions of the patient's history were reviewed and updated as appropriate: allergies, current medications, past family history, past medical history, past social history, past surgical history and problem list.   Objective:   Vitals:   06/02/21 1046  BP: 131/75  Pulse: 98  Weight: (!) 376 lb 4.8 oz (170.7 kg)    Fetal Status: Fetal Heart Rate (bpm): 167   Movement: Absent     General:  Alert, oriented and cooperative. Patient is in no acute distress.  Skin: Skin is warm and dry. No rash noted.   Cardiovascular: Normal heart rate noted  Respiratory: Normal respiratory effort, no problems with respiration noted  Abdomen: Soft, gravid, appropriate for gestational age.  Pain/Pressure: Present     Pelvic: Cervical exam performed in the presence of a chaperone Dilation: Closed Effacement (%): Thick Station: Ballotable. Sterile spec exam negative, too  Extremities: Normal range of motion.  Edema: Trace  Mental Status: Normal mood and affect. Normal behavior. Normal judgment and thought content.   Assessment and Plan:  Pregnancy: G1P0000 at [redacted]w[redacted]d 1. Supervision of high risk pregnancy, antepartum F/u anatomy u/s later this month - AFP, Serum, Open Spina Bifida - TSH - Referral to Nutrition and Diabetes Services  2. History of gastric ulcer Unable  to take aspirin  3. Deep vein thrombosis (DVT) of radial vein of right upper extremity, unspecified chronicity (HCC) Pt currently on lovenox 40 qday and she confirms this. I told her that, due to her BMI, she will need a higher dose. I will touch base with MFM but, for now, I told her to switch from q24h to q12h dosing for her lovenox 40mg . Patient is an  Very strong family history of VTE. Will need postpartum Heme referral - enoxaparin (LOVENOX) 40 MG/0.4ML injection; Inject 0.4 mLs (40 mg total) into the skin every 12 (twelve) hours.  Dispense: 24 mL; Refill: 6  4. BMI 50.0-59.9, adult (HCC) Importance of no more weight gain for the pregnancy d/w her. As this is likely the cause of her discomfort. Pt amenable to nutrition referral - Referral to Nutrition and Diabetes Services  5. Obesity in pregnancy - Referral to Nutrition and Diabetes Services  6. Chronic hypertension during pregnancy, antepartum Based on her old VS, I told her I would give her the diagnosis of cHTN. No change in prenatal care. Patient currently on no meds  7. Anticoagulant long-term use See above  Preterm labor symptoms and general obstetric precautions including but not limited to vaginal bleeding, contractions, leaking of fluid and fetal movement were reviewed in detail with the patient. Please refer to After Visit Summary for other counseling recommendations.   Return in about 2 weeks (around 06/16/2021) for in person, md visit, high risk ob.  Future Appointments  Date Time Provider Department Center  06/16/2021  8:55 AM 06/18/2021, MD [facility]  06/16/2021 10:00 AM [facility] NURSE  [facility]  06/16/2021 10:15 AM [facility] US2 [facility]    Prince Frederick Bing, MD

## 2021-06-05 ENCOUNTER — Inpatient Hospital Stay (HOSPITAL_COMMUNITY)
Admission: AD | Admit: 2021-06-05 | Discharge: 2021-06-05 | Disposition: A | Discharge status: Home / Self Care | Payer: BC Managed Care – PPO | Attending: Obstetrics and Gynecology | Admitting: Obstetrics and Gynecology

## 2021-06-05 ENCOUNTER — Inpatient Hospital Stay (HOSPITAL_BASED_OUTPATIENT_CLINIC_OR_DEPARTMENT_OTHER): Payer: BC Managed Care – PPO

## 2021-06-05 ENCOUNTER — Other Ambulatory Visit: Payer: Self-pay

## 2021-06-05 ENCOUNTER — Encounter (HOSPITAL_COMMUNITY): Payer: Self-pay | Admitting: Obstetrics and Gynecology

## 2021-06-05 DIAGNOSIS — Z3A17 17 weeks gestation of pregnancy: Secondary | ICD-10-CM | POA: Diagnosis not present

## 2021-06-05 DIAGNOSIS — O10919 Unspecified pre-existing hypertension complicating pregnancy, unspecified trimester: Secondary | ICD-10-CM | POA: Diagnosis not present

## 2021-06-05 DIAGNOSIS — Z7901 Long term (current) use of anticoagulants: Secondary | ICD-10-CM | POA: Diagnosis not present

## 2021-06-05 DIAGNOSIS — M7989 Other specified soft tissue disorders: Secondary | ICD-10-CM | POA: Insufficient documentation

## 2021-06-05 DIAGNOSIS — O10912 Unspecified pre-existing hypertension complicating pregnancy, second trimester: Secondary | ICD-10-CM | POA: Diagnosis not present

## 2021-06-05 DIAGNOSIS — M79604 Pain in right leg: Secondary | ICD-10-CM

## 2021-06-05 DIAGNOSIS — Z8489 Family history of other specified conditions: Secondary | ICD-10-CM | POA: Diagnosis not present

## 2021-06-05 DIAGNOSIS — Z86718 Personal history of other venous thrombosis and embolism: Secondary | ICD-10-CM | POA: Diagnosis not present

## 2021-06-05 DIAGNOSIS — Z79899 Other long term (current) drug therapy: Secondary | ICD-10-CM | POA: Insufficient documentation

## 2021-06-05 LAB — URINALYSIS, ROUTINE W REFLEX MICROSCOPIC
Bilirubin Urine: NEGATIVE
Glucose, UA: NEGATIVE mg/dL
Ketones, ur: NEGATIVE mg/dL
Leukocytes,Ua: NEGATIVE
Nitrite: NEGATIVE
Protein, ur: NEGATIVE mg/dL
Specific Gravity, Urine: >1.03 — ABNORMAL HIGH (ref 1.005–1.030)
pH: 6 (ref 5.0–8.0)

## 2021-06-05 LAB — URINALYSIS, MICROSCOPIC (REFLEX)

## 2021-06-05 MED ORDER — LABETALOL HCL 200 MG PO TABS: 100.0000 mg | ORAL_TABLET | Freq: Two times a day (BID) | ORAL | 1 refills | Status: DC

## 2021-06-05 MED ORDER — LABETALOL HCL 100 MG PO TABS
200.0000 mg | ORAL_TABLET | Freq: Once | ORAL | Status: AC
  Administered 2021-06-05: 200 mg via ORAL
  Filled 2021-06-05: qty 2

## 2021-06-05 NOTE — MAU Provider Note (Signed)
History     CSN: [id number]  Arrival date and time: 06/05/21 1513   Event Date/Time   First Provider Initiated Contact with Patient 06/05/21 1650      Chief Complaint  Patient presents with   Hypertension   Leg Pain   HPI Cassandra White is a 35 y.o. G1P0000 at [redacted]w[redacted]d who presents with elevated blood pressures. She reports she takes her blood pressure daily and today it was in the 160-170s/100s. She has CHTN but is not on medication. She denies any headaches, visual changes or epigastric pain. She also reports swelling and pain in her right leg. She has a hx of DVT and is on lovenox. She is very concerned this is another DVT. She denies any bleeding or leaking. Denies any abdominal pain.  OB History     Gravida  1   Para  0   Term      Preterm  0   AB  0   Living  0      SAB  0   IAB      Ectopic      Multiple      Live Births  0           Past Medical History:  Diagnosis Date   Asthma    Breast disorder    lump in right breast   Chronic hypertension during pregnancy, antepartum 06/02/2021   Edema of left lower extremity    Since age 20    Fibrocystic breast changes    lump in right breast U/S 11/22/11: Multiple complicated cysts with mural nodularity as discussed above. Given the appearance, I recommend tissue sampling of the most complicated appearing lesion. If this demonstrates benign findings, recommend following the remaining lesions. Ultrasound- guided core biopsy was discussed with the patient. This will be scheduled per patient preference.  BI-RADS CATEGO   History of blood clots    right leg   Obese     Past Surgical History:  Procedure Laterality Date   BUNIONECTOMY     right foot   COLPOSCOPY  2009/2010    Family History  Problem Relation Age of Onset   Hypertension Mother    Asthma Mother    Pulmonary embolism Mother    Deep vein thrombosis Mother    Asthma Brother    Diabetes Father    Cancer Maternal Uncle        lung    Cancer Maternal Grandmother        brain   Cancer Maternal Grandfather        pancreatic   Diabetes Paternal Grandmother    Diabetes Paternal Grandfather     Social History   Tobacco Use   Smoking status: Former    Types: Cigarettes    Quit date: 01/04/2010    Years since quitting: 11.4   Smokeless tobacco: Current  Vaping Use   Vaping Use: Never used  Substance Use Topics   Alcohol use: Not Currently    Comment: socially   Drug use: No    Allergies:  Allergies  Allergen Reactions   Aspirin Other (See Comments)    Can not take due to ulcer   Latex Hives and Swelling   Other     cucumbers   Avocado Nausea Only    Medications Prior to Admission  Medication Sig Dispense Refill Last Dose   enoxaparin (LOVENOX) 40 MG/0.4ML injection Inject 0.4 mLs (40 mg total) into the skin every 12 (twelve) hours. 24 mL  6 06/05/2021 at 0745   Prenatal MV & Min w/FA-DHA (PRENATAL GUMMIES) 0.18-25 MG CHEW Chew by mouth.   06/05/2021   metroNIDAZOLE (FLAGYL) 500 MG tablet Take 1 tablet (500 mg total) by mouth 2 (two) times daily. 14 tablet 0     Review of Systems Physical Exam   Blood pressure 128/62, pulse 94, temperature 98.2 F (36.8 C), temperature source Oral, resp. rate 18, height 5\' 8"  (1.727 m), weight (!) 170.9 kg, last menstrual period 02/04/2021, SpO2 100 %.  Physical Exam  MAU Course  Procedures Results for orders placed or performed during the hospital encounter of 06/05/21 (from the past 24 hour(s))  Urinalysis, Routine w reflex microscopic Urine, Clean Catch     Status: Abnormal   Collection Time: 06/05/21  4:24 PM  Result Value Ref Range   Color, Urine YELLOW YELLOW   APPearance CLEAR CLEAR   Specific Gravity, Urine >1.030 (H) 1.005 - 1.030   pH 6.0 5.0 - 8.0   Glucose, UA NEGATIVE NEGATIVE mg/dL   Hgb urine dipstick TRACE (A) NEGATIVE   Bilirubin Urine NEGATIVE NEGATIVE   Ketones, ur NEGATIVE NEGATIVE mg/dL   Protein, ur NEGATIVE NEGATIVE mg/dL   Nitrite  NEGATIVE NEGATIVE   Leukocytes,Ua NEGATIVE NEGATIVE  Urinalysis, Microscopic (reflex)     Status: Abnormal   Collection Time: 06/05/21  4:24 PM  Result Value Ref Range   RBC / HPF 0-5 0 - 5 RBC/hpf   WBC, UA 0-5 0 - 5 WBC/hpf   Bacteria, UA RARE (A) NONE SEEN   Squamous Epithelial / LPF 6-10 0 - 5   Mucus PRESENT    Hyaline Casts, UA PRESENT     VAS 06/07/21 LOWER EXTREMITY VENOUS (DVT) (ONLY [facility] & [facility])  Result Date: 06/05/2021  Lower Venous DVT Study Patient Name:  Cassandra White  Date of Exam:   06/05/2021 Medical Rec #: 06/07/2021       Accession #:    [id number] Date of Birth: 1986/05/12        Patient Gender: F Patient Age:   64 years Exam Location:  [facility] Procedure:      VAS [facility] LOWER EXTREMITY VENOUS (DVT) Referring Phys: Nachmen Mansel --------------------------------------------------------------------------------  Indications: Pain, and Swelling. [redacted] weeks pregnant. Patient endorses family history of blood clots and a clotting disorder.  Risk Factors: DVT Right radial DVT 12/18/16. Comparison Study: Prior negative right lower extremity venous duplex done                   11/25/2015 Performing Technologist: 01/25/2016 RVS  Examination Guidelines: A complete evaluation includes B-mode imaging, spectral Doppler, color Doppler, and power Doppler as needed of all accessible portions of each vessel. Bilateral testing is considered an integral part of a complete examination. Limited examinations for reoccurring indications may be performed as noted. The reflux portion of the exam is performed with the patient in reverse Trendelenburg.  +---------+---------------+---------+-----------+----------+--------------+  RIGHT     Compressibility Phasicity Spontaneity Properties Thrombus Aging  +---------+---------------+---------+-----------+----------+--------------+  CFV       Full            Yes       Yes                                     +---------+---------------+---------+-----------+----------+--------------+  SFJ       Full                                                             +---------+---------------+---------+-----------+----------+--------------+  FV Prox   Full                                                             +---------+---------------+---------+-----------+----------+--------------+  FV Mid    Full                                                             +---------+---------------+---------+-----------+----------+--------------+  FV Distal Full                                                             +---------+---------------+---------+-----------+----------+--------------+  PFV       Full                                                             +---------+---------------+---------+-----------+----------+--------------+  POP       Full            Yes       Yes                                    +---------+---------------+---------+-----------+----------+--------------+  PTV       Full                                                             +---------+---------------+---------+-----------+----------+--------------+  PERO      Full                                                             +---------+---------------+---------+-----------+----------+--------------+   +----+---------------+---------+-----------+----------+--------------+  LEFT Compressibility Phasicity Spontaneity Properties Thrombus Aging  +----+---------------+---------+-----------+----------+--------------+  CFV  Full            Yes       Yes                                    +----+---------------+---------+-----------+----------+--------------+    Summary: RIGHT: - No evidence of deep vein thrombosis in the lower extremity. No indirect evidence of obstruction proximal to the inguinal ligament. - No cystic structure found in the popliteal fossa.  LEFT: - No evidence of common femoral vein obstruction.  *See table(s) above for measurements  and observations.  Preliminary      MDM UA LE Duplex US Labetalol PO  Discussed that this is not preeclampsia given gestational age and education provided on [place] and need for appropriate concern.  Assessment and Plan   1. Chronic hypertension affecting pregnancy   2. Leg swelling   3. [redacted] weeks gestation of pregnancy    -Discharge home in stable condition -Rx for labetalol  -Hypertension precautions discussed -Patient advised to follow-up with OB this week for a BP check -Patient may return to MAU as needed or if her condition were to change or worsen   Rolm Bookbinder CNM 06/05/2021, 4:50 PM

## 2021-06-05 NOTE — Discharge Instructions (Signed)

## 2021-06-05 NOTE — Progress Notes (Signed)
VASCULAR LAB    Right lower extremity venous duplex has been performed.  See CV proc for preliminary results.  Gave verbal results to Cleone Slim, CNM  Woodley Petzold, [facility], RVT 06/05/2021, 6:11 PM

## 2021-06-05 NOTE — ED Notes (Signed)
Pt stated she was going to go to [facility] hospital instead of staying in ED to be evaluated.

## 2021-06-05 NOTE — MAU Note (Signed)
Cassandra White is a 35 y.o. at [redacted]w[redacted]d here in MAU reporting: has been monitoring BP at home and today it was 161/110, is not on any meds at home. Also noticed right leg and foot have been swollen and has hx of a DVT.  Onset of complaint: today  Pain score: 9/10  Vitals:   06/05/21 1614  BP: (!) 141/86  Pulse: 94  Resp: 18  Temp: 98.2 F (36.8 C)  SpO2: 100%     Lab orders placed from triage: UA

## 2021-06-06 ENCOUNTER — Encounter: Payer: Self-pay | Admitting: Obstetrics and Gynecology

## 2021-06-06 ENCOUNTER — Encounter: Payer: Self-pay | Admitting: General Practice

## 2021-06-06 ENCOUNTER — Encounter: Payer: Self-pay | Admitting: *Deleted

## 2021-06-07 LAB — AFP, SERUM, OPEN SPINA BIFIDA
AFP MoM: 1.82
AFP Value: 43.9 ng/mL
Gest. Age on Collection Date: 16 weeks
Maternal Age At EDD: 34.1 yr
OSBR Risk 1 IN: 2437
Test Results:: NEGATIVE
Weight: 376 [lb_av]

## 2021-06-07 LAB — TSH: TSH: 1.06 u[IU]/mL (ref 0.450–4.500)

## 2021-06-10 ENCOUNTER — Other Ambulatory Visit: Payer: Self-pay

## 2021-06-10 ENCOUNTER — Ambulatory Visit (INDEPENDENT_AMBULATORY_CARE_PROVIDER_SITE_OTHER): Payer: 59 | Admitting: General Practice

## 2021-06-10 VITALS — BP 122/60 | HR 97 | Ht 68.0 in | Wt 375.0 lb

## 2021-06-10 DIAGNOSIS — Z0130 Encounter for examination of blood pressure without abnormal findings: Secondary | ICD-10-CM

## 2021-06-10 MED ORDER — COMFORT FIT MATERNITY SUPP LG MISC: 1.0000 | Freq: Once | 0 refills | Status: AC

## 2021-06-10 MED ORDER — COMFORT FIT MATERNITY SUPP LG MISC: 1.0000 | Freq: Once | 0 refills | Status: DC

## 2021-06-10 NOTE — Progress Notes (Signed)
Patient presents to office today for BP check following MAU visit on 12/18. She has been taking labetalol daily & reports feeling better on it. Denies headaches, dizziness or blurry vision. BP 122/60. FHR 156bpm. Patient reports a lot of pelvic/vaginal pressure in general but worsens with sitting. Discussed using maternity support belt and provided Rx for insurance. Patient will return 12/29 for follow up OB appt.   Chase Caller RN BSN 06/10/21

## 2021-06-14 ENCOUNTER — Encounter: Payer: Self-pay | Admitting: Obstetrics and Gynecology

## 2021-06-14 NOTE — Addendum Note (Signed)
Addended by: Merian Capron on: 06/14/2021 08:47 AM   Modules accepted: Level of Service

## 2021-06-14 NOTE — Progress Notes (Signed)
Chart reviewed for nurse visit. Agree with plan of care.   Venora Maples, MD 06/14/21 8:43 AM

## 2021-06-14 NOTE — Progress Notes (Signed)
I told the patient to stay on 40mg  q12h of lovenox for now since there is debate and ACCP and ACOG recommend 40 q12h with intermediate dosing  For a patient that is over 400 pounds, I would give her a prophylactic dose of 1 mg/kg/day dosing.  Too low of a dose may not help at all.     Lovenox comes in 120 mg or 150 mg syringes.   You could give her the 150 mg QD dosing.          Previous Messages   ----- Message -----  From: Alecia Lemming, MD  Sent: 06/02/2021   1:16 PM EST  To: 06/04/2021, MD, *  Subject: RE: prophylactic lovenox dosing with high BM*   Lovenox twice daily is the correct intermediate dosing. Alternatively, to increase compliance you may give her 60 mg or 80 mg daily since we are giving for remote DVT.  [place]

## 2021-06-16 ENCOUNTER — Encounter (HOSPITAL_COMMUNITY): Payer: Self-pay | Admitting: Obstetrics & Gynecology

## 2021-06-16 ENCOUNTER — Inpatient Hospital Stay (HOSPITAL_COMMUNITY)
Admission: AD | Admit: 2021-06-16 | Discharge: 2021-06-21 | DRG: 818 | Disposition: A | Discharge status: Home / Self Care | Payer: BC Managed Care – PPO | Attending: Obstetrics & Gynecology | Admitting: Obstetrics & Gynecology

## 2021-06-16 ENCOUNTER — Ambulatory Visit: Payer: BC Managed Care – PPO | Admitting: *Deleted

## 2021-06-16 ENCOUNTER — Other Ambulatory Visit: Payer: Self-pay

## 2021-06-16 ENCOUNTER — Ambulatory Visit (HOSPITAL_BASED_OUTPATIENT_CLINIC_OR_DEPARTMENT_OTHER): Payer: BC Managed Care – PPO | Admitting: Obstetrics

## 2021-06-16 ENCOUNTER — Ambulatory Visit: Payer: BC Managed Care – PPO | Attending: Obstetrics and Gynecology

## 2021-06-16 ENCOUNTER — Ambulatory Visit (INDEPENDENT_AMBULATORY_CARE_PROVIDER_SITE_OTHER): Payer: 59 | Admitting: Obstetrics & Gynecology

## 2021-06-16 ENCOUNTER — Other Ambulatory Visit: Payer: Self-pay | Admitting: *Deleted

## 2021-06-16 ENCOUNTER — Other Ambulatory Visit: Payer: Self-pay | Admitting: Obstetrics and Gynecology

## 2021-06-16 VITALS — BP 122/73 | HR 91

## 2021-06-16 VITALS — BP 159/105 | HR 103 | Wt 373.0 lb

## 2021-06-16 DIAGNOSIS — O3432 Maternal care for cervical incompetence, second trimester: Secondary | ICD-10-CM | POA: Diagnosis not present

## 2021-06-16 DIAGNOSIS — Z20822 Contact with and (suspected) exposure to covid-19: Secondary | ICD-10-CM | POA: Diagnosis present

## 2021-06-16 DIAGNOSIS — N883 Incompetence of cervix uteri: Secondary | ICD-10-CM

## 2021-06-16 DIAGNOSIS — M5432 Sciatica, left side: Secondary | ICD-10-CM | POA: Diagnosis present

## 2021-06-16 DIAGNOSIS — O10012 Pre-existing essential hypertension complicating pregnancy, second trimester: Secondary | ICD-10-CM | POA: Diagnosis present

## 2021-06-16 DIAGNOSIS — Z86718 Personal history of other venous thrombosis and embolism: Secondary | ICD-10-CM

## 2021-06-16 DIAGNOSIS — Z3A19 19 weeks gestation of pregnancy: Secondary | ICD-10-CM | POA: Diagnosis not present

## 2021-06-16 DIAGNOSIS — O88212 Thromboembolism in pregnancy, second trimester: Secondary | ICD-10-CM | POA: Diagnosis not present

## 2021-06-16 DIAGNOSIS — O0990 Supervision of high risk pregnancy, unspecified, unspecified trimester: Secondary | ICD-10-CM | POA: Diagnosis not present

## 2021-06-16 DIAGNOSIS — Z3A18 18 weeks gestation of pregnancy: Secondary | ICD-10-CM | POA: Diagnosis present

## 2021-06-16 DIAGNOSIS — O99212 Obesity complicating pregnancy, second trimester: Secondary | ICD-10-CM | POA: Diagnosis present

## 2021-06-16 DIAGNOSIS — O10912 Unspecified pre-existing hypertension complicating pregnancy, second trimester: Secondary | ICD-10-CM | POA: Insufficient documentation

## 2021-06-16 DIAGNOSIS — Z7901 Long term (current) use of anticoagulants: Secondary | ICD-10-CM

## 2021-06-16 DIAGNOSIS — O3430 Maternal care for cervical incompetence, unspecified trimester: Secondary | ICD-10-CM | POA: Diagnosis present

## 2021-06-16 DIAGNOSIS — O99210 Obesity complicating pregnancy, unspecified trimester: Secondary | ICD-10-CM

## 2021-06-16 DIAGNOSIS — O2232 Deep phlebothrombosis in pregnancy, second trimester: Secondary | ICD-10-CM

## 2021-06-16 DIAGNOSIS — I82621 Acute embolism and thrombosis of deep veins of right upper extremity: Secondary | ICD-10-CM

## 2021-06-16 DIAGNOSIS — O10919 Unspecified pre-existing hypertension complicating pregnancy, unspecified trimester: Secondary | ICD-10-CM

## 2021-06-16 HISTORY — DX: Polycystic ovarian syndrome: E28.2

## 2021-06-16 LAB — COMPREHENSIVE METABOLIC PANEL
ALT: 18 U/L (ref 0–44)
AST: 20 U/L (ref 15–41)
Albumin: 3 g/dL — ABNORMAL LOW (ref 3.5–5.0)
Alkaline Phosphatase: 40 U/L (ref 38–126)
Anion gap: 5 (ref 5–15)
BUN: 6 mg/dL (ref 6–20)
CO2: 25 mmol/L (ref 22–32)
Calcium: 9.4 mg/dL (ref 8.9–10.3)
Chloride: 107 mmol/L (ref 98–111)
Creatinine, Ser: 0.61 mg/dL (ref 0.44–1.00)
GFR, Estimated: >60 mL/min (ref >60)
Glucose, Bld: 114 mg/dL — ABNORMAL HIGH (ref 70–99)
Potassium: 3.3 mmol/L — ABNORMAL LOW (ref 3.5–5.1)
Sodium: 137 mmol/L (ref 135–145)
Total Bilirubin: 0.4 mg/dL (ref 0.3–1.2)
Total Protein: 6.3 g/dL — ABNORMAL LOW (ref 6.5–8.1)

## 2021-06-16 LAB — CBC
HCT: 31.8 % — ABNORMAL LOW (ref 36.0–46.0)
Hemoglobin: 10.5 g/dL — ABNORMAL LOW (ref 12.0–15.0)
MCH: 31.6 pg (ref 26.0–34.0)
MCHC: 33 g/dL (ref 30.0–36.0)
MCV: 95.8 fL (ref 80.0–100.0)
Platelets: 333 10*3/uL (ref 150–400)
RBC: 3.32 MIL/uL — ABNORMAL LOW (ref 3.87–5.11)
RDW: 13.3 % (ref 11.5–15.5)
WBC: 8.9 10*3/uL (ref 4.0–10.5)
nRBC: 0 % (ref 0.0–0.2)

## 2021-06-16 LAB — RESP PANEL BY RT-PCR (FLU A&B, COVID) ARPGX2
Influenza A by PCR: NEGATIVE
Influenza B by PCR: NEGATIVE
SARS Coronavirus 2 by RT PCR: NEGATIVE

## 2021-06-16 LAB — TYPE AND SCREEN
ABO/RH(D): A POS
Antibody Screen: NEGATIVE

## 2021-06-16 MED ORDER — DOCUSATE SODIUM 100 MG PO CAPS: 100.0000 mg | ORAL_CAPSULE | Freq: Every day | ORAL | Status: DC

## 2021-06-16 MED ORDER — PROGESTERONE 200 MG PO CAPS
200.0000 mg | ORAL_CAPSULE | Freq: Every day | ORAL | Status: DC
  Administered 2021-06-16: 200 mg via VAGINAL
  Filled 2021-06-16: qty 1

## 2021-06-16 MED ORDER — LABETALOL HCL 100 MG PO TABS
100.0000 mg | ORAL_TABLET | Freq: Two times a day (BID) | ORAL | Status: DC
  Administered 2021-06-16 – 2021-06-17 (×2): 100 mg via ORAL
  Filled 2021-06-16 (×2): qty 1

## 2021-06-16 MED ORDER — CALCIUM CARBONATE ANTACID 500 MG PO CHEW: 2.0000 | CHEWABLE_TABLET | ORAL | Status: DC | PRN

## 2021-06-16 MED ORDER — ZOLPIDEM TARTRATE 5 MG PO TABS: 5.0000 mg | ORAL_TABLET | Freq: Every evening | ORAL | Status: DC | PRN

## 2021-06-16 MED ORDER — ACETAMINOPHEN 325 MG PO TABS
650.0000 mg | ORAL_TABLET | ORAL | Status: DC | PRN
  Administered 2021-06-16: 650 mg via ORAL
  Filled 2021-06-16: qty 2

## 2021-06-16 MED ORDER — PRENATAL MULTIVITAMIN CH: 1.0000 | ORAL_TABLET | Freq: Every day | ORAL | Status: DC

## 2021-06-16 NOTE — Progress Notes (Signed)
MFM Note  Cassandra White was seen for a detailed fetal anatomy scan due to obesity with a BMI of 55, chronic hypertension treated with labetalol 200 mg twice a day, and history of multiple prior DVTs.  She is treated with Lovenox 40 mg twice daily.  This is the patient's first pregnancy.  She reports that she has had 5 prior DVTs.  She also has a significant family history of thromboembolic events. Both her mother and grandmother had pulmonary emboli.  She has not been worked up to determine if she has a thrombophilia or the antiphospholipid antibody syndrome.  She denies any other significant past medical history and denies any problems in her current pregnancy.    She had a cell free DNA test earlier in her pregnancy which indicated a low risk for trisomy 74, 7, and 13. A female fetus is predicted.   She was informed that the fetal growth (EFW 10 oz) and amniotic fluid level were appropriate for her gestational age.    There were no obvious fetal anomalies noted on today's ultrasound exam.  However, today's exam was limited due to maternal body habitus and the fetal position.  The patient was informed that anomalies may be missed due to technical limitations. If the fetus is in a suboptimal position or maternal habitus is increased, visualization of the fetus in the maternal uterus may be impaired.  During the performance of her abdominal ultrasound, funneling of her cervix was suspected.    A transvaginal ultrasound performed today confirmed that her cervix is funneled all the way down to the external cervical os.  A small amount of debris is noted within the cervical funnel.  The increased risk of a preterm/previable birth associated with significant cervical funneling was discussed.  She denies any prior surgeries to her cervix.    The patient was advised that as this is her first pregnancy and she does not have a prior history of a prior preterm birth, hopefully with proper management, we  can help her achieve a successful pregnancy outcome.  I performed a digital exam today.  Her cervix is fingertip dilated and soft.  I could not palpate the gestational sac within the cervix.  Management options for a funneled cervix including the use of daily vaginal progesterone and a rescue cervical cerclage were discussed.  As her cervical length is still long despite the significant funneling, she will most likely benefit from a rescue cerclage.  I discussed her case with Dr. Despina Hidden.  The patient will be sent to the hospital today and placed in the Trendelenburg position.  Dr. Despina Hidden will attempt to place a rescue cerclage tomorrow morning.  Due to her significant history of multiple prior thromboembolic events, she should continue to be treated with Lovenox throughout her pregnancy.  We will continue to follow her closely with serial ultrasound exams.  A follow-up exam was scheduled in 2 weeks.    The patient and her partner stated that all of their questions have been answered.  A total of 45 minutes was spent counseling and coordinating the care for this patient.  Greater than 50% of the time was spent in direct face-to-face contact.

## 2021-06-16 NOTE — H&P (Addendum)
Antepartum History and Physical  Cassandra White is a 35 y.o. G1P0000 [redacted]w[redacted]d Estimated Date of Delivery: 11/11/21  with Patient's last menstrual period was 02/04/2021. admitted for a rescue placement of McDonald cerclage 06/17/21.  Pt presented for her anatomy scan today and was found to have cervical funneling the entire length of the cervix.  Cervix is closed by sonogram, FT by Dr Fritz Pickerel exam.  Does not extend past cervical os.  Images reveal turbid fluid in the funnel section.  Pt is asymptomatic  PMH:    Past Medical History:  Diagnosis Date   Asthma    Breast disorder    lump in right breast   Chronic hypertension during pregnancy, antepartum 06/02/2021   Edema of left lower extremity    Since age 54    Fibrocystic breast changes    lump in right breast U/S [id number]: Multiple complicated cysts with mural nodularity as discussed above. Given the appearance, I recommend tissue sampling of the most complicated appearing lesion. If this demonstrates benign findings, recommend following the remaining lesions. Ultrasound- guided core biopsy was discussed with the patient. This will be scheduled per patient preference.  BI-RADS CATEGO   History of blood clots    right leg, left leg, right forearm   Obese    PCOS (polycystic ovarian syndrome)     PSH:     Past Surgical History:  Procedure Laterality Date   BUNIONECTOMY     right foot   COLPOSCOPY  2009/2010    POb/GynH:      OB History     Gravida  1   Para  0   Term      Preterm  0   AB  0   Living  0      SAB  0   IAB      Ectopic      Multiple      Live Births  0           SH:   Social History   Tobacco Use   Smoking status: Former    Types: Cigarettes    Quit date: 01/04/2010    Years since quitting: 11.4   Smokeless tobacco: Current  Vaping Use   Vaping Use: Never used  Substance Use Topics   Alcohol use: Not Currently    Comment: socially   Drug use: No    FH:    Family History  Problem  Relation Age of Onset   Hypertension Mother    Asthma Mother    Pulmonary embolism Mother    Deep vein thrombosis Mother    Asthma Brother    Diabetes Father    Cancer Maternal Uncle        lung   Cancer Maternal Grandmother        brain   Cancer Maternal Grandfather        pancreatic   Diabetes Paternal Grandmother    Diabetes Paternal Grandfather      Allergies:  Allergies  Allergen Reactions   Aspirin Other (See Comments)    Can not take due to ulcer   Other     cucumbers   Avocado Nausea Only    Medications:       Current Facility-Administered Medications:    acetaminophen (TYLENOL) tablet 650 mg, 650 mg, Oral, Q4H PRN, Florian Buff, MD   calcium carbonate (TUMS - dosed in mg elemental calcium) chewable tablet 400 mg of elemental calcium, 2 tablet, Oral, Q4H PRN, Tania Ade  H, MD   docusate sodium (COLACE) capsule 100 mg, 100 mg, Oral, Daily, Salisha Bardsley, Mertie Clause, MD   labetalol (NORMODYNE) tablet 100 mg, 100 mg, Oral, BID, Elonda Husky, Mertie Clause, MD   [START ON 06/17/2021] prenatal multivitamin tablet 1 tablet, 1 tablet, Oral, Q1200, Copeland Neisen, Mertie Clause, MD   progesterone (PROMETRIUM) capsule 200 mg, 200 mg, Vaginal, QHS, Calise Dunckel, Mertie Clause, MD   zolpidem (AMBIEN) tablet 5 mg, 5 mg, Oral, QHS PRN, Florian Buff, MD  Review of Systems:   Review of Systems  Constitutional: Negative for fever, chills, weight loss, malaise/fatigue and diaphoresis.  HENT: Negative for hearing loss, ear pain, nosebleeds, congestion, sore throat, neck pain, tinnitus and ear discharge.   Eyes: Negative for blurred vision, double vision, photophobia, pain, discharge and redness.  Respiratory: Negative for cough, hemoptysis, sputum production, shortness of breath, wheezing and stridor.   Cardiovascular: Negative for chest pain, palpitations, orthopnea, claudication, leg swelling and PND.  Gastrointestinal: Positive for abdominal pain. Negative for heartburn, nausea, vomiting, diarrhea, constipation, blood in  stool and melena.  Genitourinary: Negative for dysuria, urgency, frequency, hematuria and flank pain.  Musculoskeletal: Negative for myalgias, back pain, joint pain and falls.  Skin: Negative for itching and rash.  Neurological: Negative for dizziness, tingling, tremors, sensory change, speech change, focal weakness, seizures, loss of consciousness, weakness and headaches.  Endo/Heme/Allergies: Negative for environmental allergies and polydipsia. Does not bruise/bleed easily.  Psychiatric/Behavioral: Negative for depression, suicidal ideas, hallucinations, memory loss and substance abuse. The patient is not nervous/anxious and does not have insomnia.      PHYSICAL EXAM:  Blood pressure 116/62, pulse 84, temperature 98.2 F (36.8 C), temperature source Oral, resp. rate 16, height 5\' 8"  (1.727 m), weight (!) 169.2 kg, last menstrual period 02/04/2021.    Vitals reviewed. Constitutional: She is oriented to person, place, and time. She appears well-developed and well-nourished.  HENT:  Head: Normocephalic and atraumatic.  Right Ear: External ear normal.  Left Ear: External ear normal.  Nose: Nose normal.  Mouth/Throat: Oropharynx is clear and moist.  Eyes: Conjunctivae and EOM are normal. Pupils are equal, round, and reactive to light. Right eye exhibits no discharge. Left eye exhibits no discharge. No scleral icterus.  Neck: Normal range of motion. Neck supple. No tracheal deviation present. No thyromegaly present.  Cardiovascular: Normal rate, regular rhythm, normal heart sounds and intact distal pulses.  Exam reveals no gallop and no friction rub.   No murmur heard. Respiratory: Effort normal and breath sounds normal. No respiratory distress. She has no wheezes. She has no rales. She exhibits no tenderness.  GI: Soft. Bowel sounds are normal. She exhibits no distension and no mass. There is tenderness. There is no rebound and no guarding.  Genitourinary:       Vulva is normal without  lesions   Uterus is enlarged, 20 weeks size Adnexa is negative with normal sized ovaries by sonogram  Musculoskeletal: Normal range of motion. She exhibits no edema and no tenderness.  Neurological: She is alert and oriented to person, place, and time. She has normal reflexes. She displays normal reflexes. No cranial nerve deficit. She exhibits normal muscle tone. Coordination normal.  Skin: Skin is warm and dry. No rash noted. No erythema. No pallor.  Psychiatric: She has a normal mood and affect. Her behavior is normal. Judgment and thought content normal.    Labs: Results for orders placed or performed during the hospital encounter of 06/05/21 (from the past 336 hour(s))  Urinalysis, Routine w reflex  microscopic Urine, Clean Catch   Collection Time: 06/05/21  4:24 PM  Result Value Ref Range   Color, Urine YELLOW YELLOW   APPearance CLEAR CLEAR   Specific Gravity, Urine >1.030 (H) 1.005 - 1.030   pH 6.0 5.0 - 8.0   Glucose, UA NEGATIVE NEGATIVE mg/dL   Hgb urine dipstick TRACE (A) NEGATIVE   Bilirubin Urine NEGATIVE NEGATIVE   Ketones, ur NEGATIVE NEGATIVE mg/dL   Protein, ur NEGATIVE NEGATIVE mg/dL   Nitrite NEGATIVE NEGATIVE   Leukocytes,Ua NEGATIVE NEGATIVE  Urinalysis, Microscopic (reflex)   Collection Time: 06/05/21  4:24 PM  Result Value Ref Range   RBC / HPF 0-5 0 - 5 RBC/hpf   WBC, UA 0-5 0 - 5 WBC/hpf   Bacteria, UA RARE (A) NONE SEEN   Squamous Epithelial / LPF 6-10 0 - 5   Mucus PRESENT    Hyaline Casts, UA PRESENT     EKG: Orders placed or performed during the hospital encounter of 11/24/15   EKG 12-Lead   EKG 12-Lead   ED EKG   ED EKG   EKG    Imaging Studies: VAS [place] LOWER EXTREMITY VENOUS (DVT) (ONLY [facility] & [facility])  Result Date: 06/06/2021  Lower Venous DVT Study Patient Name:  PEGGI CHIRILLO  Date of Exam:   06/05/2021 Medical Rec #: [id number]       Accession #:    [id number] Date of Birth: 1986/03/26        Patient Gender: F Patient Age:   65 years Exam  Location:  [facility] Procedure:      VAS [place] LOWER EXTREMITY VENOUS (DVT) Referring Phys: CAROLINE NEILL --------------------------------------------------------------------------------  Indications: Pain, and Swelling. [redacted] weeks pregnant. Patient endorses family history of blood clots and a clotting disorder.  Risk Factors: DVT Right radial DVT 12/18/16. Comparison Study: Prior negative right lower extremity venous duplex done                   11/25/2015 Performing Technologist: Sharion Dove RVS  Examination Guidelines: A complete evaluation includes B-mode imaging, spectral Doppler, color Doppler, and power Doppler as needed of all accessible portions of each vessel. Bilateral testing is considered an integral part of a complete examination. Limited examinations for reoccurring indications may be performed as noted. The reflux portion of the exam is performed with the patient in reverse Trendelenburg.  +---------+---------------+---------+-----------+----------+--------------+  RIGHT     Compressibility Phasicity Spontaneity Properties Thrombus Aging  +---------+---------------+---------+-----------+----------+--------------+  CFV       Full            Yes       Yes                                    +---------+---------------+---------+-----------+----------+--------------+  SFJ       Full                                                             +---------+---------------+---------+-----------+----------+--------------+  FV Prox   Full                                                             +---------+---------------+---------+-----------+----------+--------------+  FV Mid    Full                                                             +---------+---------------+---------+-----------+----------+--------------+  FV Distal Full                                                             +---------+---------------+---------+-----------+----------+--------------+  PFV       Full                                                              +---------+---------------+---------+-----------+----------+--------------+  POP       Full            Yes       Yes                                    +---------+---------------+---------+-----------+----------+--------------+  PTV       Full                                                             +---------+---------------+---------+-----------+----------+--------------+  PERO      Full                                                             +---------+---------------+---------+-----------+----------+--------------+   +----+---------------+---------+-----------+----------+--------------+  LEFT Compressibility Phasicity Spontaneity Properties Thrombus Aging  +----+---------------+---------+-----------+----------+--------------+  CFV  Full            Yes       Yes                                    +----+---------------+---------+-----------+----------+--------------+    Summary: RIGHT: - No evidence of deep vein thrombosis in the lower extremity. No indirect evidence of obstruction proximal to the inguinal ligament. - No cystic structure found in the popliteal fossa.  LEFT: - No evidence of common femoral vein obstruction.  *See table(s) above for measurements and observations. Electronically signed by Servando Snare MD on 06/06/2021 at 4:38:06 PM.    Final       Assessment: [redacted]w[redacted]d  Cervical incompetence Hx of DVT x 5 CHTN Patient Active Problem List   Diagnosis Date Noted   Incompetency, cervical 06/16/2021   Anticoagulant long-term use 06/02/2021   Obesity in pregnancy 06/02/2021   Chronic hypertension during pregnancy, antepartum 06/02/2021   History  of gastric ulcer 06/02/2021   Supervision of high risk pregnancy, antepartum 04/20/2021   Sciatica 04/20/2021   Deep vein thrombosis (DVT) of right radial vein (HCC) 12/29/2016   BMI 50.0-59.9, adult (HCC) 08/16/2006    Plan: Trendelenberg position Placement of McDonald cerclage tomorrow 12/30 at  10am Begin prometrium tonight Hold lovenox til post op  Lazaro Arms 06/16/2021 4:27 PM

## 2021-06-16 NOTE — Progress Notes (Signed)
Patient reports pelvic pressure due to her sitting for long periods of time. Denies any vaginal bleeding.

## 2021-06-16 NOTE — Progress Notes (Signed)
° °  PRENATAL VISIT NOTE  Subjective:  Cassandra White is a 35 y.o. G1P0000 at [redacted]w[redacted]d being seen today for ongoing prenatal care.  She is currently monitored for the following issues for this high-risk pregnancy and has BMI 50.0-59.9, adult (HCC); Deep vein thrombosis (DVT) of right radial vein (HCC); Supervision of high risk pregnancy, antepartum; Sciatica; Anticoagulant long-term use; Obesity in pregnancy; Chronic hypertension during pregnancy, antepartum; and History of gastric ulcer on their problem list.  Patient reports no complaints.  Contractions: Not present. Vag. Bleeding: None.  Movement: Present. Denies leaking of fluid.   The following portions of the patient's history were reviewed and updated as appropriate: allergies, current medications, past family history, past medical history, past social history, past surgical history and problem list.   Objective:   Vitals:   06/16/21 0915  BP: (!) 159/105  Pulse: (!) 103  Weight: (!) 373 lb (169.2 kg)    Fetal Status: Fetal Heart Rate (bpm): 164   Movement: Present     General:  Alert, oriented and cooperative. Patient is in no acute distress.  Skin: Skin is warm and dry. No rash noted.   Cardiovascular: Normal heart rate noted  Respiratory: Normal respiratory effort, no problems with respiration noted  Abdomen: Soft, gravid, appropriate for gestational age.  Pain/Pressure: Present     Pelvic: Cervical exam deferred        Extremities: Normal range of motion.  Edema: None  Mental Status: Normal mood and affect. Normal behavior. Normal judgment and thought content.   Assessment and Plan:  Pregnancy: G1P0000 at [redacted]w[redacted]d 1. Supervision of high risk pregnancy, antepartum Anatomy scan today  2. Sciatica of left side   3. Chronic hypertension during pregnancy, antepartum BP at home is acceptable and we will continue present labetalol  4. Deep vein thrombosis (DVT) of radial vein of right upper extremity, unspecified chronicity  (HCC) Lovenox  5. Obesity in pregnancy Body mass index is 56.71 kg/m.   Preterm labor symptoms and general obstetric precautions including but not limited to vaginal bleeding, contractions, leaking of fluid and fetal movement were reviewed in detail with the patient. Please refer to After Visit Summary for other counseling recommendations.   Return in about 4 weeks (around 07/14/2021).  Future Appointments  Date Time Provider Department Center  06/16/2021 10:00 AM [facility] NURSE [facility]  06/16/2021 10:15 AM [facility]  06/30/2021  9:15 AM [facility]    Scheryl Darter, MD

## 2021-06-17 ENCOUNTER — Observation Stay (HOSPITAL_COMMUNITY): Payer: BC Managed Care – PPO | Admitting: Anesthesiology

## 2021-06-17 ENCOUNTER — Encounter (HOSPITAL_COMMUNITY)
Admission: AD | Disposition: A | Discharge status: Home / Self Care | Payer: Self-pay | Source: Home / Self Care | Attending: Obstetrics & Gynecology

## 2021-06-17 ENCOUNTER — Inpatient Hospital Stay (HOSPITAL_COMMUNITY): Admission: RE | Admit: 2021-06-17 | Payer: 59 | Source: Home / Self Care | Admitting: Obstetrics & Gynecology

## 2021-06-17 ENCOUNTER — Encounter (HOSPITAL_COMMUNITY): Payer: Self-pay | Admitting: Obstetrics & Gynecology

## 2021-06-17 DIAGNOSIS — O3432 Maternal care for cervical incompetence, second trimester: Secondary | ICD-10-CM | POA: Diagnosis not present

## 2021-06-17 DIAGNOSIS — N883 Incompetence of cervix uteri: Secondary | ICD-10-CM | POA: Diagnosis not present

## 2021-06-17 DIAGNOSIS — Z20822 Contact with and (suspected) exposure to covid-19: Secondary | ICD-10-CM | POA: Diagnosis not present

## 2021-06-17 DIAGNOSIS — Z3A19 19 weeks gestation of pregnancy: Secondary | ICD-10-CM | POA: Diagnosis not present

## 2021-06-17 DIAGNOSIS — O99212 Obesity complicating pregnancy, second trimester: Secondary | ICD-10-CM | POA: Diagnosis not present

## 2021-06-17 DIAGNOSIS — O10012 Pre-existing essential hypertension complicating pregnancy, second trimester: Secondary | ICD-10-CM | POA: Diagnosis not present

## 2021-06-17 HISTORY — PX: CERVICAL CERCLAGE: SHX1329

## 2021-06-17 LAB — URINALYSIS, ROUTINE W REFLEX MICROSCOPIC
Bilirubin Urine: NEGATIVE
Glucose, UA: NEGATIVE mg/dL
Hgb urine dipstick: NEGATIVE
Ketones, ur: NEGATIVE mg/dL
Leukocytes,Ua: NEGATIVE
Nitrite: NEGATIVE
Protein, ur: NEGATIVE mg/dL
Specific Gravity, Urine: 1.016 (ref 1.005–1.030)
pH: 6 (ref 5.0–8.0)

## 2021-06-17 SURGERY — CERCLAGE, CERVIX, VAGINAL APPROACH
Anesthesia: Spinal

## 2021-06-17 MED ORDER — DEXTROSE IN LACTATED RINGERS 5 % IV SOLN: INTRAVENOUS | Status: DC

## 2021-06-17 MED ORDER — COMPLETENATE 29-1 MG PO CHEW
1.0000 | CHEWABLE_TABLET | Freq: Every day | ORAL | Status: DC
  Administered 2021-06-18 – 2021-06-20 (×3): 1 via ORAL
  Filled 2021-06-17 (×3): qty 1

## 2021-06-17 MED ORDER — PHENYLEPHRINE 40 MCG/ML (10ML) SYRINGE FOR IV PUSH (FOR BLOOD PRESSURE SUPPORT)
PREFILLED_SYRINGE | INTRAVENOUS | Status: AC
  Filled 2021-06-17: qty 10

## 2021-06-17 MED ORDER — SODIUM CHLORIDE 0.9 % IV SOLN
25.0000 mg | Freq: Once | INTRAVENOUS | Status: AC
  Administered 2021-06-17: 25 mg via INTRAVENOUS
  Filled 2021-06-17: qty 1

## 2021-06-17 MED ORDER — LACTATED RINGERS IV SOLN: INTRAVENOUS | Status: DC | PRN

## 2021-06-17 MED ORDER — CHLOROPROCAINE HCL 50 MG/5ML IT SOLN
INTRATHECAL | Status: AC
  Filled 2021-06-17: qty 5

## 2021-06-17 MED ORDER — CALCIUM CARBONATE ANTACID 500 MG PO CHEW: 2.0000 | CHEWABLE_TABLET | ORAL | Status: DC | PRN

## 2021-06-17 MED ORDER — ACETAMINOPHEN 325 MG PO TABS
650.0000 mg | ORAL_TABLET | ORAL | Status: DC | PRN
  Administered 2021-06-18 (×2): 650 mg via ORAL
  Filled 2021-06-17 (×2): qty 2

## 2021-06-17 MED ORDER — INDOMETHACIN 50 MG RE SUPP
50.0000 mg | Freq: Three times a day (TID) | RECTAL | Status: DC
  Administered 2021-06-17 – 2021-06-20 (×8): 50 mg via RECTAL
  Filled 2021-06-17 (×10): qty 1

## 2021-06-17 MED ORDER — POVIDONE-IODINE 10 % EX SWAB: 2.0000 "application " | Freq: Once | CUTANEOUS | Status: DC

## 2021-06-17 MED ORDER — PROGESTERONE 200 MG PO CAPS
200.0000 mg | ORAL_CAPSULE | Freq: Every day | ORAL | Status: DC
  Administered 2021-06-17 – 2021-06-20 (×4): 200 mg via VAGINAL
  Filled 2021-06-17 (×4): qty 1

## 2021-06-17 MED ORDER — CHLOROPROCAINE HCL 50 MG/5ML IT SOLN
INTRATHECAL | Status: DC | PRN
  Administered 2021-06-17: 4 mL via INTRATHECAL

## 2021-06-17 MED ORDER — PHENYLEPHRINE HCL (PRESSORS) 10 MG/ML IV SOLN
INTRAVENOUS | Status: DC | PRN
  Administered 2021-06-17 (×3): 40 ug via INTRAVENOUS

## 2021-06-17 MED ORDER — FENTANYL CITRATE (PF) 100 MCG/2ML IJ SOLN
INTRAMUSCULAR | Status: DC | PRN
  Administered 2021-06-17: 25 ug via INTRATHECAL

## 2021-06-17 MED ORDER — ZOLPIDEM TARTRATE 5 MG PO TABS: 5.0000 mg | ORAL_TABLET | Freq: Every evening | ORAL | Status: DC | PRN

## 2021-06-17 MED ORDER — DEXTROSE 5 % IV SOLN
INTRAVENOUS | Status: DC | PRN
  Administered 2021-06-17: 3 g via INTRAVENOUS

## 2021-06-17 MED ORDER — CEFAZOLIN IN SODIUM CHLORIDE 3-0.9 GM/100ML-% IV SOLN: 3.0000 g | INTRAVENOUS | Status: DC

## 2021-06-17 MED ORDER — FENTANYL CITRATE (PF) 100 MCG/2ML IJ SOLN
INTRAMUSCULAR | Status: AC
  Filled 2021-06-17: qty 2

## 2021-06-17 MED ORDER — SODIUM CHLORIDE 0.9 % IV SOLN: 25.0000 mg | Freq: Once | INTRAVENOUS | Status: DC

## 2021-06-17 MED ORDER — SODIUM CHLORIDE 0.9 % IV SOLN
1.0000 g | Freq: Three times a day (TID) | INTRAVENOUS | Status: DC
  Administered 2021-06-17 – 2021-06-18 (×3): 1 g via INTRAVENOUS
  Filled 2021-06-17 (×5): qty 1000

## 2021-06-17 MED ORDER — DOCUSATE SODIUM 100 MG PO CAPS
100.0000 mg | ORAL_CAPSULE | Freq: Every day | ORAL | Status: DC
  Administered 2021-06-17 – 2021-06-20 (×4): 100 mg via ORAL
  Filled 2021-06-17 (×4): qty 1

## 2021-06-17 MED ORDER — SODIUM CHLORIDE 0.9 % IV SOLN
500.0000 mg | INTRAVENOUS | Status: AC
  Administered 2021-06-18 – 2021-06-19 (×2): 500 mg via INTRAVENOUS
  Filled 2021-06-17 (×2): qty 5

## 2021-06-17 MED ORDER — CEFAZOLIN IN SODIUM CHLORIDE 3-0.9 GM/100ML-% IV SOLN
INTRAVENOUS | Status: AC
  Filled 2021-06-17: qty 100

## 2021-06-17 MED ORDER — LABETALOL HCL 200 MG PO TABS
200.0000 mg | ORAL_TABLET | Freq: Two times a day (BID) | ORAL | Status: DC
  Administered 2021-06-17 – 2021-06-20 (×7): 200 mg via ORAL
  Filled 2021-06-17 (×7): qty 1

## 2021-06-17 MED ORDER — SODIUM CHLORIDE 0.9 % IV SOLN: INTRAVENOUS | Status: DC

## 2021-06-17 MED ORDER — ENOXAPARIN SODIUM 40 MG/0.4ML IJ SOSY
40.0000 mg | PREFILLED_SYRINGE | Freq: Two times a day (BID) | INTRAMUSCULAR | Status: DC
  Administered 2021-06-17 – 2021-06-20 (×7): 40 mg via SUBCUTANEOUS
  Filled 2021-06-17 (×7): qty 0.4

## 2021-06-17 MED ORDER — SODIUM CHLORIDE 0.9 % IR SOLN
Status: DC | PRN
  Administered 2021-06-17: 1
  Administered 2021-06-17: 500 mL

## 2021-06-17 MED ORDER — PRENATAL MULTIVITAMIN CH
1.0000 | ORAL_TABLET | Freq: Every day | ORAL | Status: DC
  Administered 2021-06-17: 1 via ORAL
  Filled 2021-06-17: qty 1

## 2021-06-17 MED ORDER — SODIUM CHLORIDE 0.9 % IV SOLN
500.0000 mg | Freq: Once | INTRAVENOUS | Status: AC
  Administered 2021-06-17: 500 mg via INTRAVENOUS
  Filled 2021-06-17: qty 5

## 2021-06-17 SURGICAL SUPPLY — 19 items
CANISTER SUCT 3000ML PPV (MISCELLANEOUS) ×2 IMPLANT
GLOVE BIOGEL PI IND STRL 7.0 (GLOVE) ×1 IMPLANT
GLOVE BIOGEL PI IND STRL 8 (GLOVE) ×1 IMPLANT
GLOVE BIOGEL PI INDICATOR 7.0 (GLOVE) ×1
GLOVE BIOGEL PI INDICATOR 8 (GLOVE) ×1
GLOVE ECLIPSE 8.0 STRL XLNG CF (GLOVE) ×2 IMPLANT
GOWN STRL REUS W/TWL LRG LVL3 (GOWN DISPOSABLE) ×4 IMPLANT
NDL MAYO CATGUT SZ4 TPR NDL (NEEDLE) IMPLANT
NEEDLE MAYO CATGUT SZ4 (NEEDLE) IMPLANT
PACK VAGINAL MINOR WOMEN LF (CUSTOM PROCEDURE TRAY) ×2 IMPLANT
PAD OB MATERNITY 4.3X12.25 (PERSONAL CARE ITEMS) ×2 IMPLANT
PAD PREP 24X48 CUFFED NSTRL (MISCELLANEOUS) ×2 IMPLANT
SUT MERSILENE 5MM BP 1 12 (SUTURE) IMPLANT
SUT PROLENE 1 CT 1 30 (SUTURE) IMPLANT
SUT SILK 2 0 SH (SUTURE) IMPLANT
TOWEL OR 17X24 6PK STRL BLUE (TOWEL DISPOSABLE) ×4 IMPLANT
TRAY FOLEY W/BAG SLVR 14FR (SET/KITS/TRAYS/PACK) ×2 IMPLANT
TUBING NON-CON 1/4 X 20 CONN (TUBING) IMPLANT
YANKAUER SUCT BULB TIP NO VENT (SUCTIONS) IMPLANT

## 2021-06-17 NOTE — BH Specialist Note (Signed)
Less than 5 minute video call; pt is in hospital now after IUFD, and requests to reschedule for two weeks; agrees to call Asher Muir at [phone] as needed prior to scheduled visit.

## 2021-06-17 NOTE — Anesthesia Preprocedure Evaluation (Signed)
Anesthesia Evaluation  Patient identified by MRN, date of birth, ID band Patient awake    Reviewed: Allergy & Precautions, NPO status , Patient's Chart, lab work & pertinent test results  Airway Mallampati: III  TM Distance: >3 FB Neck ROM: Full    Dental no notable dental hx.    Pulmonary asthma , former smoker,    Pulmonary exam normal breath sounds clear to auscultation       Cardiovascular hypertension, Pt. on medications + DVT  Normal cardiovascular exam Rhythm:Regular Rate:Normal     Neuro/Psych negative neurological ROS  negative psych ROS   GI/Hepatic negative GI ROS, Neg liver ROS,   Endo/Other  Morbid obesity (BMI 56)PCOS  Renal/GU negative Renal ROS  negative genitourinary   Musculoskeletal negative musculoskeletal ROS (+)   Abdominal   Peds  Hematology  (+) Blood dyscrasia (on lovenox, last dose 06/16/21 at 0655), ,   Anesthesia Other Findings Cerclage at [redacted] weeks gestation   Reproductive/Obstetrics (+) Pregnancy                             Anesthesia Physical Anesthesia Plan  ASA: 3  Anesthesia Plan: Spinal   Post-op Pain Management:    Induction:   PONV Risk Score and Plan: Treatment may vary due to age or medical condition  Airway Management Planned: Natural Airway  Additional Equipment:   Intra-op Plan:   Post-operative Plan:   Informed Consent: I have reviewed the patients History and Physical, chart, labs and discussed the procedure including the risks, benefits and alternatives for the proposed anesthesia with the patient or authorized representative who has indicated his/her understanding and acceptance.     Dental advisory given  Plan Discussed with: CRNA  Anesthesia Plan Comments:         Anesthesia Quick Evaluation

## 2021-06-17 NOTE — Anesthesia Postprocedure Evaluation (Signed)
Anesthesia Post Note  Patient: Cassandra White  Procedure(s) Performed: CERCLAGE CERVICAL     Patient location during evaluation: PACU Anesthesia Type: Spinal Level of consciousness: oriented and awake and alert Pain management: pain level controlled Vital Signs Assessment: post-procedure vital signs reviewed and stable Respiratory status: spontaneous breathing, respiratory function stable and patient connected to nasal cannula oxygen Cardiovascular status: blood pressure returned to baseline and stable Postop Assessment: no headache, no backache and no apparent nausea or vomiting Anesthetic complications: no   No notable events documented.  Last Vitals:  Vitals:   06/17/21 1345 06/17/21 1400  BP: (!) 146/78 (!) 150/77  Pulse:    Resp: (!) 22   Temp:    SpO2:      Last Pain:  Vitals:   06/17/21 1120  TempSrc: Oral  PainSc: 0-No pain   Pain Goal:    LLE Motor Response: Purposeful movement (06/17/21 1319) LLE Sensation: Tingling (06/17/21 1319) RLE Motor Response: Purposeful movement (06/17/21 1319) RLE Sensation: Tingling (06/17/21 1319) L Sensory Level: L5-Outer lower leg, top of foot, great toe (06/17/21 1319) R Sensory Level: L5-Outer lower leg, top of foot, great toe (06/17/21 1319) Epidural/Spinal Function Cutaneous sensation: Tingles (06/17/21 1319), Patient able to flex knees: Yes (06/17/21 1319), Patient able to lift hips off bed: No (06/17/21 1319), Back pain beyond tenderness at insertion site: No (06/17/21 1319), Progressively worsening motor and/or sensory loss: No (06/17/21 1319), Bowel and/or bladder incontinence post epidural: No (06/17/21 1319)  Demetries Coia L Finola Rosal

## 2021-06-17 NOTE — Op Note (Signed)
Preoperative diagnosis: Intrauterine pregnancy at [redacted] weeks gestation                                        Cervical incompetence, funneling of the entire length of the cervix  Postop diagnosis: Cervix dilated to 4 cm with membranes beyond the cervical os approximately 2 cm  Procedure: Placement of McDonald cerclage, rescue  Surgeon:  Lazaro Arms, MD  Anesthesia: Spinal  Findings: Patient went in for her routine anatomy scan yesterday and was found to have cervical funneling down the entire length of the cervix.  Sonogram revealed the cervix to be closed and Dr. Parke Poisson stated the patient was a tight fingertip on digital exam.  I decided to admit the patient placed her in Trendelenburg position gave her Prometrium last evening.    Unfortunately at the time of surgery today, her cervix was visually dilated 4 cm with fetal membranes protruding past the external os a good 2 to 3 cm.  Again the patient has been totally asymptomatic both prior to admission and since admission  Description of operation: Patient was taken to the operating room and placed in the sitting position where she underwent a spinal anesthetic She was placed in the supine position and then in dorsal lithotomy position steroids I performed the surgical prep of the mons pubis lower abdomen inner thighs perineum vagina and perirectal area Foley catheter was placed by me A longbilled weighted speculum was placed and a short Deaver was placed anteriorly I was when I discovered the significant change in her cervical status from her sonogram yesterday morning I then talked to the patient regarding the clinical findings I discussed with her the possibility of intraoperative rupture membranes and postoperative rupture membranes because of infection to the amnion and she was aware of this preoperatively With this finding certainly that risk increases exponentially She understood that and I recommended going forward was an attempt to  place a rescue cerclage even though the intraoperative risk of rupture membranes was significant Patient agreed and I proceeded I did not want to use a Foley catheter push the membranes back as I was afraid that there would be rupture membranes occurring because of the 4 cm of membrane that was exposed So I feel retrofilled the bladder with 5 to 600 cc of saline placed the patient in steep Trendelenburg and with some time the membranes recessed I was then able to grasp the anterior and posterior lip of the cervix at this point pulled down the cervix lower uterine segment to be able to place the 5 mm Mersilene tape suture I could see the membranes the entire time I was doing the cerclage placement took great care not to rupture the membranes at the time of surgery The 5 mm Mersilene tape was placed beginning at 12:00 and in a counterclockwise fashion 5 throws of the suture was made a pursestring fashion and it was tied down at the 12 o'clock position 0 silk was placed at the end of the night to hold the 2 ends of the Mersilene tape in place Mersilene tape was then cut above the silk suture Was no evidence of rupture membranes at this point There was no bleeding from the cervix at this point either The patient tolerated procedure well she was taken recovery in good stable condition all counts correct Blood loss was minimal He received 3 g of  Ancef prophylactically preoperatively She will be kept in the hospital over the next couple of days and receive IV antibiotics as is found in the ACOG literature below there is not much data outlined management of these patients post cerclage placement And the fact her greatest risk is infection of the amnion resulting in rupture membranes and placing her on IV antibiotics for the next 48 hours and recommending completing a 10-day course of oral antibiotic Timing of discharge will depend on her clinical course going forward Patient and father baby are aware of the  clinical circumstances understand the small chance of success in this clinical setting and she is willing today what ever she can to preserve this pregnancy to viability  Lazaro Arms, MD 06/17/2021 4:32 PM

## 2021-06-17 NOTE — Plan of Care (Signed)

## 2021-06-17 NOTE — Anesthesia Procedure Notes (Signed)
Spinal  Patient location during procedure: OR Start time: 06/17/2021 11:45 AM End time: 06/17/2021 11:48 AM Reason for block: surgical anesthesia Staffing Performed: anesthesiologist  Anesthesiologist: Elmer Picker, MD Preanesthetic Checklist Completed: patient identified, IV checked, risks and benefits discussed, surgical consent, monitors and equipment checked, pre-op evaluation and timeout performed Spinal Block Patient position: sitting Prep: DuraPrep and site prepped and draped Patient monitoring: cardiac monitor, continuous pulse ox and blood pressure Approach: midline Location: L3-4 Injection technique: single-shot Needle Needle type: Pencan  Needle gauge: 24 G Needle length: 9 cm Assessment Sensory level: T6 Events: CSF return Additional Notes Functioning IV was confirmed and monitors were applied. Sterile prep and drape, including hand hygiene and sterile gloves were used. The patient was positioned and the spine was prepped. The skin was anesthetized with lidocaine.  Free flow of clear CSF was obtained prior to injecting local anesthetic into the CSF.  The spinal needle aspirated freely following injection.  The needle was carefully withdrawn.  The patient tolerated the procedure well.

## 2021-06-17 NOTE — Transfer of Care (Signed)
Immediate Anesthesia Transfer of Care Note  Patient: Cassandra White  Procedure(s) Performed: CERCLAGE CERVICAL  Patient Location: PACU  Anesthesia Type:Spinal  Level of Consciousness: awake, alert  and oriented  Airway & Oxygen Therapy: Patient Spontanous Breathing  Post-op Assessment: Report given to RN and Post -op Vital signs reviewed and stable  Post vital signs: Reviewed and stable  Last Vitals:  Vitals Value Taken Time  BP 156/85 06/17/21 1319  Temp    Pulse    Resp 14 06/17/21 1320  SpO2    Vitals shown include unvalidated device data.  Last Pain:  Vitals:   06/17/21 1120  TempSrc: Oral  PainSc: 0-No pain         Complications: No notable events documented.

## 2021-06-18 DIAGNOSIS — O3432 Maternal care for cervical incompetence, second trimester: Secondary | ICD-10-CM | POA: Diagnosis not present

## 2021-06-18 MED ORDER — SODIUM CHLORIDE 0.9 % IV SOLN
2.0000 g | Freq: Four times a day (QID) | INTRAVENOUS | Status: AC
  Administered 2021-06-18 – 2021-06-19 (×5): 2 g via INTRAVENOUS
  Filled 2021-06-18 (×5): qty 2000

## 2021-06-18 NOTE — Progress Notes (Signed)
FACULTY PRACTICE ANTEPARTUM(COMPREHENSIVE) NOTE  Cassandra White is a 35 y.o. G1P0000 with Estimated Date of Delivery: 11/11/21   By  LMP [redacted]w[redacted]d  who is admitted for cervical insufficiency.    Fetal presentation is cephalic. Length of Stay:  1  Days  Date of admission:06/16/2021  Subjective: Noted some nausea overnight, but slept well overnight.  Denies fever or chills.  No acute complaints Patient reports the fetal movement as active. Patient reports uterine contraction  activity as none. Patient reports  vaginal bleeding as none. Patient describes fluid per vagina as None.  Vitals:  Blood pressure (!) 148/80, pulse 96, temperature 98.1 F (36.7 C), temperature source Oral, resp. rate 18, height 5\' 8"  (1.727 m), weight (!) 169.2 kg, last menstrual period 02/04/2021, SpO2 98 %. Vitals:   06/17/21 1957 06/17/21 2354 06/18/21 0611 06/18/21 0739  BP: (!) 132/91 121/71 140/75 (!) 148/80  Pulse: 97 97 92 96  Resp: 18 18 18 18   Temp: 98 F (36.7 C) 98.2 F (36.8 C) 98.3 F (36.8 C) 98.1 F (36.7 C)  TempSrc: Oral Oral Oral Oral  SpO2: 100% 100% 99% 98%  Weight:      Height:       Physical Examination:  General appearance - alert, well appearing, and in no distress Mental status - normal mood, behavior, speech, dress, motor activity, and thought processes Chest - clear to auscultation, no wheezes, rales or rhonchi, symmetric air entry Heart - normal rate and regular rhythm Abdomen - gravid and non-tender Extremities - no calf tenderness, no edema Skin - warm and dry  Fetal Doppler: 155bpm   ASSESSMENT: G1P0000 [redacted]w[redacted]d Estimated Date of Delivery: 11/11/21  Patient Active Problem List   Diagnosis Date Noted   Incompetency, cervical 06/16/2021   Anticoagulant long-term use 06/02/2021   Obesity in pregnancy 06/02/2021   Chronic hypertension during pregnancy, antepartum 06/02/2021   History of gastric ulcer 06/02/2021   Supervision of high risk pregnancy, antepartum 04/20/2021    Sciatica 04/20/2021   Deep vein thrombosis (DVT) of right radial vein (HCC) 12/29/2016   BMI 50.0-59.9, adult (HCC) 08/16/2006    PLAN: 1) Fetal well being- reassuring by doppler  2) Cervical insufficiency -s/p cerclage on 12/30 -currently on IV Amp/Azithro x 48hrs -Indomethacin rectally x 48hr - vaginal progesterone nightly -plan to remove foley, up to void today  3) cHTN -continue Labetalol 200mg  bid  4) h/o DVT -continue Lovenox twice daily  DISPO: Plan for IV antibiotics x 24hr, followed by in-patient monitoring for a minimum of 24hr.  If patient remains stable with no evidence of infection or labor, possible discharge home Tuesday 06/21/20 with 10 day outpatient antibiotic regimen.  1/31 [name] 06/18/2021,10:27 AM

## 2021-06-19 DIAGNOSIS — O3432 Maternal care for cervical incompetence, second trimester: Secondary | ICD-10-CM | POA: Diagnosis present

## 2021-06-19 DIAGNOSIS — N883 Incompetence of cervix uteri: Secondary | ICD-10-CM | POA: Diagnosis not present

## 2021-06-19 DIAGNOSIS — Z3A18 18 weeks gestation of pregnancy: Secondary | ICD-10-CM | POA: Diagnosis not present

## 2021-06-19 DIAGNOSIS — O10012 Pre-existing essential hypertension complicating pregnancy, second trimester: Secondary | ICD-10-CM | POA: Diagnosis present

## 2021-06-19 DIAGNOSIS — Z7901 Long term (current) use of anticoagulants: Secondary | ICD-10-CM | POA: Diagnosis not present

## 2021-06-19 DIAGNOSIS — O99212 Obesity complicating pregnancy, second trimester: Secondary | ICD-10-CM | POA: Diagnosis present

## 2021-06-19 DIAGNOSIS — Z20822 Contact with and (suspected) exposure to covid-19: Secondary | ICD-10-CM | POA: Diagnosis present

## 2021-06-19 DIAGNOSIS — Z86718 Personal history of other venous thrombosis and embolism: Secondary | ICD-10-CM | POA: Diagnosis not present

## 2021-06-19 DIAGNOSIS — Z3A19 19 weeks gestation of pregnancy: Secondary | ICD-10-CM | POA: Diagnosis not present

## 2021-06-19 HISTORY — PX: DILATION AND CURETTAGE OF UTERUS: SHX78

## 2021-06-19 NOTE — Progress Notes (Signed)
Patient ID: Cassandra White, female   DOB: 1986-01-28, 36 y.o.   MRN: [id number] [place] COMPREHENSIVE PROGRESS NOTE  Cassandra White is a 36 y.o. G1P0000 at [redacted]w[redacted]d  who is admitted for incompetent cervix.   Fetal presentation is unsure.  POD # 2 Rescue cerclage  Subjective: Pt reports an occ cramp, no vaginal bleeding or LOF. Patient reports good fetal movement.    Vitals:  Blood pressure (!) 104/48, pulse 87, temperature 98 F (36.7 C), temperature source Oral, resp. rate 16, height 5\' 8"  (1.727 m), weight (!) 169.2 kg, last menstrual period 02/04/2021, SpO2 100 %. Physical Examination: Lungs clear Heart RRR Abd soft + BS gravid non tender GU deferred Ext non tender  Fetal Monitoring:   Doppler qd   Labs:  No results found for this or any previous visit (from the past 24 hour(s)).  Imaging Studies:    NA   Medications:  Scheduled  docusate sodium  100 mg Oral Daily   enoxaparin  40 mg Subcutaneous Q12H   indomethacin  50 mg Rectal Q8H   labetalol  200 mg Oral BID   prenatal vitamin w/FE, FA  1 tablet Oral Q1200   progesterone  200 mg Vaginal QHS   I have reviewed the patient's current medications.  ASSESSMENT: IUP 19 2/7 weeks Incompetent cervical. S/P rescue cerclage H/O DVT CHTN  PLAN: Stable. No evidence of infection of labor presently. Continue with antibiotics and tocolysis. BP stable, continue with current regiment. Continue with Lovenox.  Continue routine antenatal care.   Chancy Milroy 06/19/2021,11:04 AM

## 2021-06-19 NOTE — L&D Delivery Note (Signed)
Patient presented with previable PPROM, on exam noted to have fetal head visible at external os, being held back by cerclage. Visually 3 cm dilated. Discussed poor prognosis of previable PPROM with patient and her FOB, recommended cerclage removal which would result in delivery. Verbal consent obtained.   Patient had received Morphine 4 mg IV x 1 and was feeling a lot of pressure.  On speculum exam, fetal head noted giving a lot of pressure on cerclage.  Anterior knot of cervical cerclage was recognized and pulled upwards to visualize both sides of the circumferential suture under the knot. One side was cut and the remaining suture was pulled and removed intact. Speculum was removed, and cervix was checked and found to be 5 cm dilated.   One minute after speculum was removed, patient pushed and delivered a viable fetus around 2005.  Apgars 2/2, weight 36 g.  Some bleeding with clots noted.  On exam, placenta was noted to be fundal.  Cord was clamped and cut, fetus was handed over to patient.  Injected solution of 10 units of pitocin in 10 ml of NS into cord; 400 mcg misoprostol buccal ordered.  EBL ~300 ml.   Will keep NPO for now.  Continue to monitor closely.  Patient aware of risk of needing surgical evacuation for concerning bleeding, other maternal instability or prolonged placenta (>4 hours since delivery). Patient will be monitored overnight after delivery, [facility] RN in charge notified. Orders placed.   Appropriate support given to patient and her FOB.      Jaynie Collins, MD, FACOG Attending Obstetrician & Gynecologist, [facility] for [employer], [facility]

## 2021-06-20 MED ORDER — METRONIDAZOLE 500 MG PO TABS
500.0000 mg | ORAL_TABLET | Freq: Two times a day (BID) | ORAL | Status: DC
  Administered 2021-06-20 (×2): 500 mg via ORAL
  Filled 2021-06-20 (×2): qty 1

## 2021-06-20 MED ORDER — CEPHALEXIN 500 MG PO CAPS
500.0000 mg | ORAL_CAPSULE | Freq: Two times a day (BID) | ORAL | Status: DC
  Administered 2021-06-20 (×2): 500 mg via ORAL
  Filled 2021-06-20 (×2): qty 1

## 2021-06-20 NOTE — Progress Notes (Signed)
Patient ID: Cassandra White, female   DOB: [birth year], 36 y.o.   MRN: [id number] AC[facility] PRACTICE ANTEPARTUM COMPREHENSIVE PROGRESS NOTE  Cassandra White is a 36 y.o. G1P0000 at [redacted]w[redacted]d  who is admitted for incompetent cervix.   Fetal presentation is unsure. Length of Stay:  2  Days  POD # 3 Rescue cerclage  Subjective: Pt without complaints this morning. + BM without problerms Patient reports good fetal movement.  She reports no uterine contractions, no bleeding and no loss of fluid per vagina.  Vitals:  Blood pressure 114/62, pulse 90, temperature 97.9 F (36.6 C), temperature source Oral, resp. rate 16, height 5\' 8"  (1.727 m), weight (!) 169.2 kg, last menstrual period 02/04/2021, SpO2 100 %.  Physical Examination: Lungs clear Heart RRR Abd soft + BS gravid non tender GU deferred Ext non tender   Fetal Monitoring:   doppler  Labs:  No results found for this or any previous visit (from the past 24 hour(s)).  Imaging Studies:    NA   Medications:  Scheduled  cephALEXin  500 mg Oral Q12H   docusate sodium  100 mg Oral Daily   enoxaparin  40 mg Subcutaneous Q12H   labetalol  200 mg Oral BID   metroNIDAZOLE  500 mg Oral Q12H   prenatal vitamin w/FE, FA  1 tablet Oral Q1200   progesterone  200 mg Vaginal QHS   I have reviewed the patient's current medications.  ASSESSMENT: IUP 19 3/7 days Incompetent cervix, S/P rescue cerclage H/O DVT CHTN   PLAN: Stable. No S/Sx of infection or PTL. Switch to oral antibiotics today. Has completed indocin. Continue with Prometrium. Increase activity. BP stable on current regiment. Continue with Lovenox. Continue routine antenatal care.   5/7 06/20/2021,6:46 AM

## 2021-06-21 MED ORDER — METRONIDAZOLE 500 MG PO TABS: 500.0000 mg | ORAL_TABLET | Freq: Two times a day (BID) | ORAL | 0 refills | Status: DC

## 2021-06-21 MED ORDER — CEPHALEXIN 500 MG PO CAPS: 500.0000 mg | ORAL_CAPSULE | Freq: Two times a day (BID) | ORAL | 0 refills | Status: DC

## 2021-06-21 MED ORDER — PROGESTERONE 200 MG PO CAPS: 200.0000 mg | ORAL_CAPSULE | Freq: Every day | ORAL | 6 refills | Status: DC

## 2021-06-21 NOTE — Progress Notes (Signed)
Pt discharged after discharge instructions given. Pt verbalized understanding and all questions answered. IV discontinued. Pt discharged via wheelchair in stable condition and sent home with all belongings.

## 2021-06-21 NOTE — Discharge Summary (Signed)
Physician Discharge Summary  Patient ID: Cassandra White MRN: [id number] DOB/AGE: 11/18/1985 36 y.o.  Admit date: 06/16/2021 Discharge date: 06/21/2021  Admission Diagnoses: Cervical incompetence Chronic HTN Morbid obesity History of DVT  Discharge Diagnoses:  Principal Problem:   Incompetency, cervical Active Problems:   Cervical insufficiency during pregnancy in second trimester, antepartum Same as above  Discharged Condition: stable  Hospital Course: 36yo G1P0@[redacted]w[redacted]d  who presented for rescue cerclage due to cervical funneling noted on her anatomy scan.  See operative note regarding cerclage placement.  Pt was treated with 48hr of IV Amp/Azithro and then transitioned to oral antibiotics with plans to continue x 10 days.  She was also treated with rectal indomethacin.  She shows no signs of infection and preterm labor.  Plan for close outpatient follow up.  Consults: None  Significant Diagnostic Studies: Anatomy scan   Treatments: IV hydration, antibiotics: azithromycin and Ampicillin, and procedures: cervical cerclage, fetal monitoring  Discharge Exam: Blood pressure 136/75, pulse 93, temperature 98.4 F (36.9 C), temperature source Oral, resp. rate 17, height 5\' 8"  (1.727 m), weight (!) 169.2 kg, last menstrual period 02/04/2021, SpO2 100 %. General appearance: alert, cooperative, and no distress Resp: clear to auscultation bilaterally Cardio: regular rate and rhythm GI: obese, soft and non-tender Pelvic: exam not completed Extremities: no calf tenderness bilaterally Skin: warm and dry  Disposition: Discharge disposition: 01-Home or Self Care       Discharge Instructions     Discharge activity:  Bathroom / Shower only   Complete by: As directed    Discharge activity:  Up to eat   Complete by: As directed    Discharge activity: Bedrest   Complete by: As directed    Discharge diet:  No restrictions   Complete by: As directed    Do not have sex or do anything that  might make you have an orgasm   Complete by: As directed    Notify physician for a general feeling that "something is not right"   Complete by: As directed    Notify physician for increase or change in vaginal discharge   Complete by: As directed    Notify physician for intestinal cramps, with or without diarrhea, sometimes described as "gas pain"   Complete by: As directed    Notify physician for leaking of fluid   Complete by: As directed    Notify physician for low, dull backache, unrelieved by heat or Tylenol   Complete by: As directed    Notify physician for menstrual like cramps   Complete by: As directed    Notify physician for pelvic pressure   Complete by: As directed    Notify physician for uterine contractions.  These may be painless and feel like the uterus is tightening or the baby is  "balling up"   Complete by: As directed    Notify physician for vaginal bleeding   Complete by: As directed    PRETERM LABOR:  Includes any of the follwing symptoms that occur between 20 - [redacted] weeks gestation.  If these symptoms are not stopped, preterm labor can result in preterm delivery, placing your baby at risk   Complete by: As directed       Allergies as of 06/21/2021       Reactions   Aspirin Other (See Comments)   Can not take due to ulcer   Other    cucumbers   Avocado Nausea Only        Medication List  TAKE these medications    cephALEXin 500 MG capsule Commonly known as: KEFLEX Take 1 capsule (500 mg total) by mouth every 12 (twelve) hours for 10 days.   enoxaparin 40 MG/0.4ML injection Commonly known as: LOVENOX Inject 0.4 mLs (40 mg total) into the skin every 12 (twelve) hours.   labetalol 200 MG tablet Commonly known as: NORMODYNE Take 0.5 tablets (100 mg total) by mouth 2 (two) times daily.   metroNIDAZOLE 500 MG tablet Commonly known as: FLAGYL Take 1 tablet (500 mg total) by mouth every 12 (twelve) hours for 10 days.   Prenatal Gummies 0.18-25 MG  Chew Chew by mouth.   progesterone 200 MG capsule Commonly known as: PROMETRIUM Place 1 capsule (200 mg total) vaginally at bedtime.        Follow-up Information     [facility]. Call.   Specialty: Obstetrics and Gynecology Why: Follow up as scheduled Contact information: [address] [place] [address] [phone]                Signed: Sharon Seller 06/21/2021, 7:22 AM

## 2021-06-22 ENCOUNTER — Encounter (HOSPITAL_COMMUNITY): Payer: Self-pay | Admitting: Obstetrics and Gynecology

## 2021-06-22 ENCOUNTER — Other Ambulatory Visit: Payer: Self-pay

## 2021-06-22 ENCOUNTER — Inpatient Hospital Stay (HOSPITAL_COMMUNITY)
Admission: AD | Admit: 2021-06-22 | Discharge: 2021-06-23 | Disposition: A | Discharge status: Home / Self Care | Payer: BC Managed Care – PPO | Attending: Obstetrics and Gynecology | Admitting: Obstetrics and Gynecology

## 2021-06-22 DIAGNOSIS — O26899 Other specified pregnancy related conditions, unspecified trimester: Secondary | ICD-10-CM

## 2021-06-22 DIAGNOSIS — O10912 Unspecified pre-existing hypertension complicating pregnancy, second trimester: Secondary | ICD-10-CM | POA: Insufficient documentation

## 2021-06-22 DIAGNOSIS — O26892 Other specified pregnancy related conditions, second trimester: Secondary | ICD-10-CM

## 2021-06-22 DIAGNOSIS — O0990 Supervision of high risk pregnancy, unspecified, unspecified trimester: Secondary | ICD-10-CM

## 2021-06-22 DIAGNOSIS — Z3A19 19 weeks gestation of pregnancy: Secondary | ICD-10-CM

## 2021-06-22 DIAGNOSIS — O3432 Maternal care for cervical incompetence, second trimester: Secondary | ICD-10-CM

## 2021-06-22 DIAGNOSIS — R109 Unspecified abdominal pain: Secondary | ICD-10-CM

## 2021-06-22 LAB — URINALYSIS, ROUTINE W REFLEX MICROSCOPIC
Bilirubin Urine: NEGATIVE
Glucose, UA: NEGATIVE mg/dL
Hgb urine dipstick: NEGATIVE
Ketones, ur: NEGATIVE mg/dL
Leukocytes,Ua: NEGATIVE
Nitrite: NEGATIVE
Protein, ur: NEGATIVE mg/dL
Specific Gravity, Urine: 1.021 (ref 1.005–1.030)
pH: 5 (ref 5.0–8.0)

## 2021-06-22 MED ORDER — FLUCONAZOLE 150 MG PO TABS
150.0000 mg | ORAL_TABLET | Freq: Once | ORAL | Status: AC
  Administered 2021-06-22: 150 mg via ORAL
  Filled 2021-06-22: qty 1

## 2021-06-22 MED ORDER — IBUPROFEN 600 MG PO TABS
600.0000 mg | ORAL_TABLET | Freq: Once | ORAL | Status: AC
  Administered 2021-06-22: 600 mg via ORAL
  Filled 2021-06-22: qty 1

## 2021-06-22 MED ORDER — FAMOTIDINE 20 MG PO TABS
20.0000 mg | ORAL_TABLET | Freq: Once | ORAL | Status: AC
  Administered 2021-06-22: 20 mg via ORAL
  Filled 2021-06-22: qty 1

## 2021-06-22 NOTE — MAU Note (Signed)
Pt arrives via EMS from home with complaint of lower abd cramping that started last night and worsened today. Denies bleeding or ROM. Pt s/p cerclage 4 days ago.

## 2021-06-22 NOTE — MAU Provider Note (Signed)
Chief Complaint:  Abdominal Pain   Event Date/Time   First Provider Initiated Contact with Patient 06/22/21 2231     HPI: Cassandra White is a 36 y.o. G1P0000 at [id number] presents to maternity admissions reporting pelvic cramping.  Had a rescue cerclage on 06/17/21 when cervix was 4cm with protruding membranes.  No leaking or bleeding.. She reports good fetal movement, denies LOF, vaginal bleeding, vaginal itching/burning, urinary symptoms, h/a, dizziness, n/v, diarrhea, constipation or fever/chills.    Abdominal Pain This is a recurrent problem. The current episode started today. The onset quality is gradual. The problem occurs intermittently. The problem has been unchanged. The quality of the pain is cramping. Pertinent negatives include no constipation, diarrhea, dysuria, fever, nausea or vomiting. Nothing aggravates the pain. The pain is relieved by Nothing. She has tried nothing for the symptoms.   RN note: Pt arrives via EMS from home with complaint of lower abd cramping that started last night and worsened today. Denies bleeding or ROM. Pt s/p cerclage 4 days ago  Past Medical History: Past Medical History:  Diagnosis Date   Asthma    Breast disorder    lump in right breast   Chronic hypertension during pregnancy, antepartum 06/02/2021   Edema of left lower extremity    Since age 36    Fibrocystic breast changes    lump in right breast U/S 11/22/11: Multiple complicated cysts with mural nodularity as discussed above. Given the appearance, I recommend tissue sampling of the most complicated appearing lesion. If this demonstrates benign findings, recommend following the remaining lesions. Ultrasound- guided core biopsy was discussed with the patient. This will be scheduled per patient preference.  BI-RADS CATEGO   History of blood clots    right leg, left leg, right forearm   Obese    PCOS (polycystic ovarian syndrome)     Past obstetric history: OB History  Gravida Para Term  Preterm AB Living  1 0   0 0 0  SAB IAB Ectopic Multiple Live Births  0       0    # Outcome Date GA Lbr Len/2nd Weight Sex Delivery Anes PTL Lv  1 Current             Past Surgical History: Past Surgical History:  Procedure Laterality Date   BUNIONECTOMY     right foot   CERVICAL CERCLAGE N/A 06/17/2021   Procedure: CERCLAGE CERVICAL;  Surgeon: Lazaro ArmsEure, Luther H, MD;  Location: [facility];  Service: Gynecology;  Laterality: N/A;   COLPOSCOPY  2009/2010    Family History: Family History  Problem Relation Age of Onset   Hypertension Mother    Asthma Mother    Pulmonary embolism Mother    Deep vein thrombosis Mother    Asthma Brother    Diabetes Father    Cancer Maternal Uncle        lung   Cancer Maternal Grandmother        brain   Cancer Maternal Grandfather        pancreatic   Diabetes Paternal Grandmother    Diabetes Paternal Grandfather     Social History: Social History   Tobacco Use   Smoking status: Former    Types: Cigarettes    Quit date: 01/04/2010    Years since quitting: 11.4   Smokeless tobacco: Current  Vaping Use   Vaping Use: Never used  Substance Use Topics   Alcohol use: Not Currently    Comment: socially   Drug use:  No    Allergies:  Allergies  Allergen Reactions   Aspirin Other (See Comments)    Can not take due to ulcer   Other     cucumbers   Avocado Nausea Only    Meds:  Medications Prior to Admission  Medication Sig Dispense Refill Last Dose   cephALEXin (KEFLEX) 500 MG capsule Take 1 capsule (500 mg total) by mouth every 12 (twelve) hours for 10 days. 20 capsule 0 06/22/2021   enoxaparin (LOVENOX) 40 MG/0.4ML injection Inject 0.4 mLs (40 mg total) into the skin every 12 (twelve) hours. 24 mL 6 06/22/2021   labetalol (NORMODYNE) 200 MG tablet Take 0.5 tablets (100 mg total) by mouth 2 (two) times daily. 30 tablet 1 06/22/2021   Prenatal MV & Min w/FA-DHA (PRENATAL GUMMIES) 0.18-25 MG CHEW Chew by mouth.   06/22/2021   progesterone  (PROMETRIUM) 200 MG capsule Place 1 capsule (200 mg total) vaginally at bedtime. 30 capsule 6 06/21/2021   metroNIDAZOLE (FLAGYL) 500 MG tablet Take 1 tablet (500 mg total) by mouth every 12 (twelve) hours for 10 days. 20 tablet 0     I have reviewed patient's Past Medical Hx, Surgical Hx, Family Hx, Social Hx, medications and allergies.   ROS:  Review of Systems  Constitutional:  Negative for fever.  Gastrointestinal:  Positive for abdominal pain. Negative for constipation, diarrhea, nausea and vomiting.  Genitourinary:  Negative for dysuria.  Other systems negative  Physical Exam  Patient Vitals for the past 24 hrs:  BP Temp Pulse Resp SpO2  06/22/21 2213 (!) 149/90 99 F (37.2 C) 93 18 100 %   Constitutional: Well-developed, well-nourished female in no acute distress.  Cardiovascular: normal rate and rhythm Respiratory: normal effort, clear to auscultation bilaterally GI: Abd soft, non-tender, gravid appropriate for gestational age.   No rebound or guarding. MS: Extremities nontender, no edema, normal ROM Neurologic: Alert and oriented x 4.  GU: Neg CVAT.  PELVIC EXAM: Cervix pink, visually closed, Cerclage intact, scant white creamy discharge, vaginal walls and external genitalia normal  Cerclage palpated, no membranes bulging, Fetus out of pelvis.  FHT:  161   Labs: Results for orders placed or performed during the hospital encounter of 06/22/21 (from the past 24 hour(s))  Urinalysis, Routine w reflex microscopic Urine, Clean Catch     Status: Abnormal   Collection Time: 06/22/21 10:28 PM  Result Value Ref Range   Color, Urine YELLOW YELLOW   APPearance HAZY (A) CLEAR   Specific Gravity, Urine 1.021 1.005 - 1.030   pH 5.0 5.0 - 8.0   Glucose, UA NEGATIVE NEGATIVE mg/dL   Hgb urine dipstick NEGATIVE NEGATIVE   Bilirubin Urine NEGATIVE NEGATIVE   Ketones, ur NEGATIVE NEGATIVE mg/dL   Protein, ur NEGATIVE NEGATIVE mg/dL   Nitrite NEGATIVE NEGATIVE   Leukocytes,Ua  NEGATIVE NEGATIVE    --/--/A POS (12/29 1820)  Imaging:    MAU Course/MDM: I have ordered labs and reviewed results. UA is clear  Consult Dr Donavan Foil with presentation, exam findings and test results.  Treatments in MAU included Ibuprofen which did alleviate cramping Already on Progesterone Recommend 24-48 hours of Ibuprofen PTL precautions.    Assessment: Single IUP at [redacted]w[redacted]d Incompetent cervix with cerclage Pelvic cramping  Plan: Discharge home Recommend 24-48 hours of Ibuprofen PTL precautions.   Follow up in Office for prenatal visits  Encouraged to return if she develops worsening of symptoms, increase in pain, fever, or other concerning symptoms.   Pt stable at time of  discharge.  Wynelle Bourgeois CNM, MSN Certified Nurse-Midwife 06/22/2021 10:31 PM

## 2021-06-27 ENCOUNTER — Ambulatory Visit (INDEPENDENT_AMBULATORY_CARE_PROVIDER_SITE_OTHER): Payer: BC Managed Care – PPO | Admitting: Obstetrics and Gynecology

## 2021-06-27 ENCOUNTER — Encounter: Payer: Self-pay | Admitting: Obstetrics and Gynecology

## 2021-06-27 ENCOUNTER — Other Ambulatory Visit: Payer: Self-pay

## 2021-06-27 VITALS — BP 131/75 | HR 108 | Wt 372.0 lb

## 2021-06-27 DIAGNOSIS — O10919 Unspecified pre-existing hypertension complicating pregnancy, unspecified trimester: Secondary | ICD-10-CM

## 2021-06-27 DIAGNOSIS — O99210 Obesity complicating pregnancy, unspecified trimester: Secondary | ICD-10-CM

## 2021-06-27 DIAGNOSIS — Z6841 Body Mass Index (BMI) 40.0 and over, adult: Secondary | ICD-10-CM

## 2021-06-27 DIAGNOSIS — O3432 Maternal care for cervical incompetence, second trimester: Secondary | ICD-10-CM

## 2021-06-27 DIAGNOSIS — Z7901 Long term (current) use of anticoagulants: Secondary | ICD-10-CM

## 2021-06-27 DIAGNOSIS — I82621 Acute embolism and thrombosis of deep veins of right upper extremity: Secondary | ICD-10-CM

## 2021-06-27 DIAGNOSIS — Z3A20 20 weeks gestation of pregnancy: Secondary | ICD-10-CM

## 2021-06-27 DIAGNOSIS — O0990 Supervision of high risk pregnancy, unspecified, unspecified trimester: Secondary | ICD-10-CM

## 2021-06-27 NOTE — Progress Notes (Signed)
° ° °  PRENATAL VISIT NOTE  Subjective:  Cassandra White is a 36 y.o. G1 at [redacted]w[redacted]d being seen today for ongoing prenatal care.  She is currently monitored for the following issues for this high-risk pregnancy and has BMI 50.0-59.9, adult ([place]); Deep vein thrombosis (DVT) of right radial vein ([place]); Supervision of high risk pregnancy, antepartum; Sciatica; Anticoagulant long-term use; Obesity in pregnancy; Chronic hypertension during pregnancy, antepartum; History of gastric ulcer; Incompetency, cervical; Cervical insufficiency during pregnancy in second trimester, antepartum; and Cervical cerclage suture present in second trimester on their problem list.  Patient reports no complaints.  Contractions: Not present. Vag. Bleeding: None.  Movement: Present. Denies leaking of fluid.   The following portions of the patient's history were reviewed and updated as appropriate: allergies, current medications, past family history, past medical history, past social history, past surgical history and problem list.   Objective:   Vitals:   06/27/21 1504  BP: 131/75  Pulse: (!) 108  Weight: (!) 372 lb (168.7 kg)    Fetal Status: Fetal Heart Rate (bpm): 157   Movement: Present     General:  Alert, oriented and cooperative. Patient is in no acute distress.  Skin: Skin is warm and dry. No rash noted.   Cardiovascular: Normal heart rate noted  Respiratory: Normal respiratory effort, no problems with respiration noted  Abdomen: Soft, gravid, appropriate for gestational age.  Pain/Pressure: Present     Pelvic: Cervical exam deferred        Extremities: Normal range of motion.  Edema: Trace  Mental Status: Normal mood and affect. Normal behavior. Normal judgment and thought content.   Assessment and Plan:  Pregnancy: G1P0000 at [redacted]w[redacted]d 1. Supervision of high risk pregnancy, antepartum F/u anatomy u/s on 1/26  2. Cervical insufficiency during pregnancy in second trimester, antepartum Dx with CI at 12/29 u/s  and had a rescue cerclage placed on 12/30. She continue on vag progesterone and feels well overall. D/w her re: pelvic rest and hope to keep cerclage until term. F/u 1/16 TVUS. Pt out of work until further notice  3. Cervical cerclage suture present in second trimester  4. [redacted] weeks gestation of pregnancy  5. Chronic hypertension during pregnancy, antepartum Pt confirms on labetalol 200 bid. Continue low dose asa  6. Obesity in pregnancy  7. BMI 50.0-59.9, adult ([place])  8. Anticoagulant long-term use Pt confirms on lovenox 40 q12h  9. Deep vein thrombosis (DVT) of radial vein of right upper extremity, unspecified chronicity ([facility]) Needs Heme referral PP  Preterm labor symptoms and general obstetric precautions including but not limited to vaginal bleeding, contractions, leaking of fluid and fetal movement were reviewed in detail with the patient. Please refer to After Visit Summary for other counseling recommendations.   Return in about 17 days (around 07/14/2021).  Future Appointments  Date Time Provider [place]  06/30/2021  9:15 AM [facility]  06/30/2021 10:15 AM [facility]  07/04/2021  3:30 PM [facility] NURSE [facility]  07/04/2021  3:45 PM [facility] US1 [facility]  07/14/2021  8:30 AM [facility] NURSE [facility]  07/14/2021  8:45 AM [facility] US4 [facility]  07/14/2021  9:55 AM Griffin Basil, MD [facility]    Aletha Halim, MD

## 2021-06-29 ENCOUNTER — Inpatient Hospital Stay (EMERGENCY_DEPARTMENT_HOSPITAL)
Admission: AD | Admit: 2021-06-29 | Discharge: 2021-06-29 | Disposition: A | Discharge status: Home / Self Care | Payer: BC Managed Care – PPO | Source: Home / Self Care | Attending: Obstetrics & Gynecology | Admitting: Obstetrics & Gynecology

## 2021-06-29 ENCOUNTER — Encounter (HOSPITAL_COMMUNITY): Payer: Self-pay | Admitting: Obstetrics & Gynecology

## 2021-06-29 ENCOUNTER — Other Ambulatory Visit: Payer: Self-pay

## 2021-06-29 ENCOUNTER — Inpatient Hospital Stay (HOSPITAL_COMMUNITY)
Admission: AD | Admit: 2021-06-29 | Discharge: 2021-06-30 | DRG: 768 | Disposition: A | Discharge status: Home / Self Care | Payer: BC Managed Care – PPO | Attending: Obstetrics & Gynecology | Admitting: Obstetrics & Gynecology

## 2021-06-29 DIAGNOSIS — O3430 Maternal care for cervical incompetence, unspecified trimester: Secondary | ICD-10-CM

## 2021-06-29 DIAGNOSIS — M79651 Pain in right thigh: Secondary | ICD-10-CM | POA: Insufficient documentation

## 2021-06-29 DIAGNOSIS — O3432 Maternal care for cervical incompetence, second trimester: Secondary | ICD-10-CM | POA: Insufficient documentation

## 2021-06-29 DIAGNOSIS — O0990 Supervision of high risk pregnancy, unspecified, unspecified trimester: Secondary | ICD-10-CM

## 2021-06-29 DIAGNOSIS — D649 Anemia, unspecified: Secondary | ICD-10-CM | POA: Diagnosis present

## 2021-06-29 DIAGNOSIS — O99891 Other specified diseases and conditions complicating pregnancy: Secondary | ICD-10-CM | POA: Diagnosis not present

## 2021-06-29 DIAGNOSIS — Z79899 Other long term (current) drug therapy: Secondary | ICD-10-CM | POA: Insufficient documentation

## 2021-06-29 DIAGNOSIS — O42912 Preterm premature rupture of membranes, unspecified as to length of time between rupture and onset of labor, second trimester: Principal | ICD-10-CM | POA: Diagnosis present

## 2021-06-29 DIAGNOSIS — O99612 Diseases of the digestive system complicating pregnancy, second trimester: Secondary | ICD-10-CM | POA: Insufficient documentation

## 2021-06-29 DIAGNOSIS — O9081 Anemia of the puerperium: Secondary | ICD-10-CM | POA: Diagnosis not present

## 2021-06-29 DIAGNOSIS — M79652 Pain in left thigh: Secondary | ICD-10-CM | POA: Insufficient documentation

## 2021-06-29 DIAGNOSIS — K5900 Constipation, unspecified: Secondary | ICD-10-CM | POA: Insufficient documentation

## 2021-06-29 DIAGNOSIS — O1002 Pre-existing essential hypertension complicating childbirth: Secondary | ICD-10-CM | POA: Diagnosis present

## 2021-06-29 DIAGNOSIS — R1030 Lower abdominal pain, unspecified: Secondary | ICD-10-CM | POA: Diagnosis not present

## 2021-06-29 DIAGNOSIS — O99892 Other specified diseases and conditions complicating childbirth: Secondary | ICD-10-CM | POA: Diagnosis present

## 2021-06-29 DIAGNOSIS — O364XX0 Maternal care for intrauterine death, not applicable or unspecified: Secondary | ICD-10-CM

## 2021-06-29 DIAGNOSIS — Z3A20 20 weeks gestation of pregnancy: Secondary | ICD-10-CM

## 2021-06-29 DIAGNOSIS — O10912 Unspecified pre-existing hypertension complicating pregnancy, second trimester: Secondary | ICD-10-CM | POA: Insufficient documentation

## 2021-06-29 DIAGNOSIS — O42913 Preterm premature rupture of membranes, unspecified as to length of time between rupture and onset of labor, third trimester: Secondary | ICD-10-CM | POA: Diagnosis not present

## 2021-06-29 DIAGNOSIS — O26892 Other specified pregnancy related conditions, second trimester: Secondary | ICD-10-CM | POA: Insufficient documentation

## 2021-06-29 DIAGNOSIS — R102 Pelvic and perineal pain: Secondary | ICD-10-CM | POA: Insufficient documentation

## 2021-06-29 DIAGNOSIS — Z86718 Personal history of other venous thrombosis and embolism: Secondary | ICD-10-CM

## 2021-06-29 DIAGNOSIS — O10919 Unspecified pre-existing hypertension complicating pregnancy, unspecified trimester: Secondary | ICD-10-CM

## 2021-06-29 DIAGNOSIS — Z7901 Long term (current) use of anticoagulants: Secondary | ICD-10-CM

## 2021-06-29 DIAGNOSIS — D62 Acute posthemorrhagic anemia: Secondary | ICD-10-CM | POA: Diagnosis not present

## 2021-06-29 DIAGNOSIS — Z87891 Personal history of nicotine dependence: Secondary | ICD-10-CM

## 2021-06-29 DIAGNOSIS — O99619 Diseases of the digestive system complicating pregnancy, unspecified trimester: Secondary | ICD-10-CM

## 2021-06-29 LAB — COMPREHENSIVE METABOLIC PANEL
ALT: 22 U/L (ref 0–44)
AST: 25 U/L (ref 15–41)
Albumin: 3 g/dL — ABNORMAL LOW (ref 3.5–5.0)
Alkaline Phosphatase: 40 U/L (ref 38–126)
Anion gap: 9 (ref 5–15)
BUN: 5 mg/dL — ABNORMAL LOW (ref 6–20)
CO2: 21 mmol/L — ABNORMAL LOW (ref 22–32)
Calcium: 8.9 mg/dL (ref 8.9–10.3)
Chloride: 104 mmol/L (ref 98–111)
Creatinine, Ser: 0.63 mg/dL (ref 0.44–1.00)
GFR, Estimated: >60 mL/min (ref >60)
Glucose, Bld: 95 mg/dL (ref 70–99)
Potassium: 3.7 mmol/L (ref 3.5–5.1)
Sodium: 134 mmol/L — ABNORMAL LOW (ref 135–145)
Total Bilirubin: 0.5 mg/dL (ref 0.3–1.2)
Total Protein: 6.5 g/dL (ref 6.5–8.1)

## 2021-06-29 LAB — CBC
HCT: 30.9 % — ABNORMAL LOW (ref 36.0–46.0)
HCT: 26.1 % — ABNORMAL LOW (ref 36.0–46.0)
Hemoglobin: 10.2 g/dL — ABNORMAL LOW (ref 12.0–15.0)
Hemoglobin: 8.9 g/dL — ABNORMAL LOW (ref 12.0–15.0)
MCH: 31.3 pg (ref 26.0–34.0)
MCH: 32.2 pg (ref 26.0–34.0)
MCHC: 33 g/dL (ref 30.0–36.0)
MCHC: 34.1 g/dL (ref 30.0–36.0)
MCV: 94.8 fL (ref 80.0–100.0)
MCV: 94.6 fL (ref 80.0–100.0)
Platelets: 289 10*3/uL (ref 150–400)
Platelets: 334 10*3/uL (ref 150–400)
RBC: 3.26 MIL/uL — ABNORMAL LOW (ref 3.87–5.11)
RBC: 2.76 MIL/uL — ABNORMAL LOW (ref 3.87–5.11)
RDW: 13 % (ref 11.5–15.5)
RDW: 13.1 % (ref 11.5–15.5)
WBC: 14.1 10*3/uL — ABNORMAL HIGH (ref 4.0–10.5)
WBC: 23.5 10*3/uL — ABNORMAL HIGH (ref 4.0–10.5)
nRBC: 0 % (ref 0.0–0.2)
nRBC: 0 % (ref 0.0–0.2)

## 2021-06-29 LAB — CBC WITH DIFFERENTIAL/PLATELET
Abs Immature Granulocytes: 0.08 10*3/uL — ABNORMAL HIGH (ref 0.00–0.07)
Basophils Absolute: 0 10*3/uL (ref 0.0–0.1)
Basophils Relative: 0 %
Eosinophils Absolute: 0 10*3/uL (ref 0.0–0.5)
Eosinophils Relative: 0 %
HCT: 34.6 % — ABNORMAL LOW (ref 36.0–46.0)
Hemoglobin: 11.3 g/dL — ABNORMAL LOW (ref 12.0–15.0)
Immature Granulocytes: 0 %
Lymphocytes Relative: 4 %
Lymphs Abs: 0.7 10*3/uL (ref 0.7–4.0)
MCH: 31.2 pg (ref 26.0–34.0)
MCHC: 32.7 g/dL (ref 30.0–36.0)
MCV: 95.6 fL (ref 80.0–100.0)
Monocytes Absolute: 1.4 10*3/uL — ABNORMAL HIGH (ref 0.1–1.0)
Monocytes Relative: 7 %
Neutro Abs: 17 10*3/uL — ABNORMAL HIGH (ref 1.7–7.7)
Neutrophils Relative %: 89 %
Platelets: 308 10*3/uL (ref 150–400)
RBC: 3.62 MIL/uL — ABNORMAL LOW (ref 3.87–5.11)
RDW: 13.1 % (ref 11.5–15.5)
WBC: 19.2 10*3/uL — ABNORMAL HIGH (ref 4.0–10.5)
nRBC: 0 % (ref 0.0–0.2)

## 2021-06-29 LAB — POCT FERN TEST: POCT Fern Test: NEGATIVE

## 2021-06-29 LAB — URINALYSIS, ROUTINE W REFLEX MICROSCOPIC
Bilirubin Urine: NEGATIVE
Glucose, UA: NEGATIVE mg/dL
Hgb urine dipstick: NEGATIVE
Ketones, ur: 15 mg/dL — AB
Leukocytes,Ua: NEGATIVE
Nitrite: NEGATIVE
Protein, ur: NEGATIVE mg/dL
Specific Gravity, Urine: 1.025 (ref 1.005–1.030)
pH: 6.5 (ref 5.0–8.0)

## 2021-06-29 LAB — PROTEIN / CREATININE RATIO, URINE
Creatinine, Urine: 210.84 mg/dL
Protein Creatinine Ratio: 0.09 mg/mg{Cre} (ref 0.00–0.15)
Total Protein, Urine: 20 mg/dL

## 2021-06-29 LAB — WET PREP, GENITAL
Clue Cells Wet Prep HPF POC: NONE SEEN
Sperm: NONE SEEN
Trich, Wet Prep: NONE SEEN
WBC, Wet Prep HPF POC: >=10 — AB (ref <10)
Yeast Wet Prep HPF POC: NONE SEEN

## 2021-06-29 MED ORDER — LACTATED RINGERS IV BOLUS: 1000.0000 mL | Freq: Once | INTRAVENOUS | Status: DC

## 2021-06-29 MED ORDER — ENOXAPARIN SODIUM 40 MG/0.4ML IJ SOSY: 40.0000 mg | PREFILLED_SYRINGE | INTRAMUSCULAR | Status: DC

## 2021-06-29 MED ORDER — METHYLERGONOVINE MALEATE 0.2 MG/ML IJ SOLN
0.2000 mg | Freq: Once | INTRAMUSCULAR | Status: AC
  Administered 2021-06-29: 0.2 mg via INTRAMUSCULAR

## 2021-06-29 MED ORDER — SODIUM CHLORIDE 0.9 % IV SOLN
25.0000 mg | Freq: Once | INTRAVENOUS | Status: AC
  Administered 2021-06-29: 25 mg via INTRAVENOUS
  Filled 2021-06-29: qty 1

## 2021-06-29 MED ORDER — KETOROLAC TROMETHAMINE 60 MG/2ML IM SOLN
30.0000 mg | Freq: Once | INTRAMUSCULAR | Status: AC
  Administered 2021-06-29: 30 mg via INTRAMUSCULAR
  Filled 2021-06-29: qty 2

## 2021-06-29 MED ORDER — DOCUSATE SODIUM 100 MG PO CAPS: 100.0000 mg | ORAL_CAPSULE | Freq: Two times a day (BID) | ORAL | 2 refills | Status: DC

## 2021-06-29 MED ORDER — DOCUSATE SODIUM 100 MG PO CAPS
100.0000 mg | ORAL_CAPSULE | Freq: Every day | ORAL | Status: DC
  Administered 2021-06-30: 100 mg via ORAL
  Filled 2021-06-29: qty 1

## 2021-06-29 MED ORDER — OXYTOCIN 10 UNIT/ML IJ SOLN
10.0000 [IU] | Freq: Once | INTRAMUSCULAR | Status: AC
  Administered 2021-06-29: 10 [IU]
  Filled 2021-06-29: qty 1

## 2021-06-29 MED ORDER — METHYLERGONOVINE MALEATE 0.2 MG/ML IJ SOLN
INTRAMUSCULAR | Status: AC
  Filled 2021-06-29: qty 1

## 2021-06-29 MED ORDER — LABETALOL HCL 5 MG/ML IV SOLN: 40.0000 mg | INTRAVENOUS | Status: DC | PRN

## 2021-06-29 MED ORDER — FENTANYL CITRATE (PF) 100 MCG/2ML IJ SOLN
INTRAMUSCULAR | Status: AC
  Filled 2021-06-29: qty 2

## 2021-06-29 MED ORDER — LABETALOL HCL 300 MG PO TABS: 300.0000 mg | ORAL_TABLET | Freq: Two times a day (BID) | ORAL | 2 refills | Status: DC

## 2021-06-29 MED ORDER — CALCIUM CARBONATE ANTACID 500 MG PO CHEW
2.0000 | CHEWABLE_TABLET | ORAL | Status: DC | PRN
Start: 2021-06-29 — End: 2021-07-01

## 2021-06-29 MED ORDER — ENOXAPARIN SODIUM 80 MG/0.8ML IJ SOSY: 80.0000 mg | PREFILLED_SYRINGE | INTRAMUSCULAR | Status: DC

## 2021-06-29 MED ORDER — OXYTOCIN-SODIUM CHLORIDE 30-0.9 UT/500ML-% IV SOLN
INTRAVENOUS | Status: AC
  Administered 2021-06-29: 2.5 [IU]/h via INTRAVENOUS
  Filled 2021-06-29: qty 500

## 2021-06-29 MED ORDER — OXYCODONE-ACETAMINOPHEN 5-325 MG PO TABS
2.0000 | ORAL_TABLET | Freq: Once | ORAL | Status: AC
  Administered 2021-06-29: 2 via ORAL
  Filled 2021-06-29: qty 2

## 2021-06-29 MED ORDER — FENTANYL CITRATE (PF) 100 MCG/2ML IJ SOLN
100.0000 ug | Freq: Once | INTRAMUSCULAR | Status: AC
  Administered 2021-06-29: 100 ug via INTRAVENOUS

## 2021-06-29 MED ORDER — MORPHINE SULFATE (PF) 4 MG/ML IV SOLN
2.0000 mg | Freq: Once | INTRAVENOUS | Status: AC
  Administered 2021-06-29: 2 mg via INTRAMUSCULAR

## 2021-06-29 MED ORDER — FENTANYL CITRATE (PF) 100 MCG/2ML IJ SOLN
100.0000 ug | Freq: Once | INTRAMUSCULAR | Status: AC
  Administered 2021-06-29: 100 ug via INTRAVENOUS
  Filled 2021-06-29: qty 2

## 2021-06-29 MED ORDER — MORPHINE SULFATE (PF) 4 MG/ML IV SOLN
INTRAVENOUS | Status: AC
  Filled 2021-06-29: qty 1

## 2021-06-29 MED ORDER — MISOPROSTOL 200 MCG PO TABS
400.0000 ug | ORAL_TABLET | Freq: Once | ORAL | Status: AC
  Administered 2021-06-29: 400 ug via BUCCAL
  Filled 2021-06-29: qty 2

## 2021-06-29 MED ORDER — PROMETHAZINE HCL 25 MG/ML IJ SOLN
25.0000 mg | Freq: Once | INTRAMUSCULAR | Status: DC
  Filled 2021-06-29: qty 1

## 2021-06-29 MED ORDER — LABETALOL HCL 200 MG PO TABS
300.0000 mg | ORAL_TABLET | Freq: Two times a day (BID) | ORAL | Status: DC
  Administered 2021-06-30: 300 mg via ORAL
  Filled 2021-06-29: qty 1

## 2021-06-29 MED ORDER — AMOXICILLIN-POT CLAVULANATE 875-125 MG PO TABS
1.0000 | ORAL_TABLET | Freq: Two times a day (BID) | ORAL | Status: DC
  Administered 2021-06-30 (×2): 1 via ORAL
  Filled 2021-06-29 (×2): qty 1

## 2021-06-29 MED ORDER — OXYTOCIN 20 UNITS IN LACTATED RINGERS INFUSION - SIMPLE
125.0000 mL/h | INTRAVENOUS | Status: DC
Start: 2021-06-29 — End: 2021-06-29

## 2021-06-29 MED ORDER — LACTATED RINGERS IV BOLUS
1000.0000 mL | Freq: Once | INTRAVENOUS | Status: AC
  Administered 2021-06-29: 1000 mL via INTRAVENOUS

## 2021-06-29 MED ORDER — ZOLPIDEM TARTRATE 5 MG PO TABS: 5.0000 mg | ORAL_TABLET | Freq: Every evening | ORAL | Status: DC | PRN

## 2021-06-29 MED ORDER — NIFEDIPINE 10 MG PO CAPS
10.0000 mg | ORAL_CAPSULE | Freq: Once | ORAL | Status: AC
  Administered 2021-06-29: 10 mg via ORAL
  Filled 2021-06-29: qty 1

## 2021-06-29 MED ORDER — HYDRALAZINE HCL 20 MG/ML IJ SOLN: 10.0000 mg | INTRAMUSCULAR | Status: DC | PRN

## 2021-06-29 MED ORDER — ACETAMINOPHEN 500 MG PO TABS: 1000.0000 mg | ORAL_TABLET | Freq: Four times a day (QID) | ORAL | Status: DC | PRN

## 2021-06-29 MED ORDER — IBUPROFEN 600 MG PO TABS: 600.0000 mg | ORAL_TABLET | Freq: Four times a day (QID) | ORAL | Status: DC | PRN

## 2021-06-29 MED ORDER — OXYTOCIN-SODIUM CHLORIDE 30-0.9 UT/500ML-% IV SOLN: 2.5000 [IU]/h | INTRAVENOUS | Status: DC

## 2021-06-29 MED ORDER — ACETAMINOPHEN 325 MG PO TABS: 650.0000 mg | ORAL_TABLET | ORAL | Status: DC | PRN

## 2021-06-29 MED ORDER — LABETALOL HCL 5 MG/ML IV SOLN: 20.0000 mg | INTRAVENOUS | Status: DC | PRN

## 2021-06-29 MED ORDER — SODIUM CHLORIDE 0.9 % IV SOLN: 2.5000 [IU]/h | INTRAVENOUS | Status: DC

## 2021-06-29 MED ORDER — HYDRALAZINE HCL 20 MG/ML IJ SOLN: 5.0000 mg | INTRAMUSCULAR | Status: DC | PRN

## 2021-06-29 MED ORDER — FENTANYL CITRATE (PF) 100 MCG/2ML IJ SOLN: 100.0000 ug | Freq: Once | INTRAMUSCULAR | Status: DC

## 2021-06-29 NOTE — Progress Notes (Addendum)
Cerclage removed by provider

## 2021-06-29 NOTE — MAU Note (Signed)
...  Cassandra White is a 36 y.o. at [redacted]w[redacted]d here in MAU reporting: Intermittent lower abdominal cramping since 0630 this morning. She states the pain she is having feels like CTX and is four minutes apart. She has also been experiencing pelvic pain since this past Monday. Has been having urinary frequency since 12 weeks. +FM. No VB or LOF.  10/10 lower abdomen 10/10 pelvic pain  FHT: 188 doppler Lab orders placed from triage: UA

## 2021-06-29 NOTE — Progress Notes (Addendum)
Was called to evaluate patient with increased bleeding per vagina. On exam, part of placenta noted to be in cervix. Increased blood noted under patient.  Patient urged to push, and placenta delivered around 2105 but was noted to have some missing fragments on examination.  Patient was consented for manual suction to evacuate rest of placental fragments.   Procedure Note PREOPERATIVE DIAGNOSIS: Retained placental products after delivery of 20 week fetus due to cervical incompetence POSTOPERATIVE DIAGNOSIS: The same PROCEDURE:  Manual Suction Curettage PROCEDURALIST:  Dr. Jaynie Collins  INDICATIONS: Cassandra White  is a 36 y.o. G1P0100 with retained placental fragments after 20 week delivery.  Risks of procedure were discussed with the patient, verbal consent was obtained.  FINDINGS:   Moderate amount of placental fragments, blood and clots.   ANESTHESIA:   Fentanyl 100 mcg IV  ESTIMATED BLOOD LOSS: 100 ml during procedure, but about 700 ml prior to placental delivery. Total EBL including delivery about 1100 ml. SPECIMENS:  Placenta and fragments COMPLICATIONS:  None immediate.  PROCEDURE DETAILS:  The patient received intravenous Fentanyl for anesthesia.  After an adequate timeout was performed, she was placed in the dorsal lithotomy position.  A vaginal speculum was then placed in the patient's vagina, multiple clots were evacuated from her vagina.  The cervix was prepped with betadine x 3 and a ring forceps was applied to the anterior lip of the cervix.   The cervix was noted to be already dilated and accommodated a 10 mm flexible suction curette that was gently advanced to the uterine fundus.  The manual suction device (Ipas Double Valve Aspirator) was then activated and curette slowly rotated to clear the uterus of products of conception. This was done three times to completely evacuate the uterus.  Methergine 0.2 mg IM was ordered to help with the bleeding. There was some bleeding noted at the  end of the procedure, and the ring forceps was removed.   All instruments were removed from the patient's vagina.  Sponge and instrument counts were correct.    The patient tolerated the procedure well.  The patient will be monitored overnight in [facility], CBC to be rechecked around 2300. If there is need for transfusion, this will be ordered. Will continue close monitoring for now.   Jaynie Collins, MD, FACOG Obstetrician & Gynecologist, [facility] for [employer], [facility]

## 2021-06-29 NOTE — MAU Provider Note (Signed)
History     CSN: [id number]  Arrival date and time: 06/29/21 1913   None     No chief complaint on file.  HPI  Ms.Cassandra White is a 36 y.o. female G1P0000 @ [redacted]w[redacted]d with rescue cerclage in place, here with PPROM and contractions. She was seen earlier today in MAU with pain and improved after Toradol. Patient vomited at home and felt a large gush of clear fluid. Shortly after she started having regular, painful contractions.   OB History     Gravida  1   Para  0   Term      Preterm  0   AB  0   Living  0      SAB  0   IAB      Ectopic      Multiple      Live Births  0           Past Medical History:  Diagnosis Date   Asthma    Breast disorder    lump in right breast   Chronic hypertension during pregnancy, antepartum 06/02/2021   Edema of left lower extremity    Since age 48    Fibrocystic breast changes    lump in right breast U/S 11/22/11: Multiple complicated cysts with mural nodularity as discussed above. Given the appearance, I recommend tissue sampling of the most complicated appearing lesion. If this demonstrates benign findings, recommend following the remaining lesions. Ultrasound- guided core biopsy was discussed with the patient. This will be scheduled per patient preference.  BI-RADS CATEGO   History of blood clots    right leg, left leg, right forearm   Obese    PCOS (polycystic ovarian syndrome)     Past Surgical History:  Procedure Laterality Date   BUNIONECTOMY     right foot   CERVICAL CERCLAGE N/A 06/17/2021   Procedure: CERCLAGE CERVICAL;  Surgeon: Lazaro Arms, MD;  Location: [facility] LD [facility];  Service: Gynecology;  Laterality: N/A;   COLPOSCOPY  2009/2010    Family History  Problem Relation Age of Onset   Hypertension Mother    Asthma Mother    Pulmonary embolism Mother    Deep vein thrombosis Mother    Asthma Brother    Diabetes Father    Cancer Maternal Uncle        lung   Cancer Maternal Grandmother        brain    Cancer Maternal Grandfather        pancreatic   Diabetes Paternal Grandmother    Diabetes Paternal Grandfather     Social History   Tobacco Use   Smoking status: Former    Types: Cigarettes    Quit date: 01/04/2010    Years since quitting: 11.4   Smokeless tobacco: Current  Vaping Use   Vaping Use: Never used  Substance Use Topics   Alcohol use: Not Currently    Comment: socially   Drug use: No    Allergies:  Allergies  Allergen Reactions   Other     cucumbers   Avocado Nausea Only    Medications Prior to Admission  Medication Sig Dispense Refill Last Dose   cephALEXin (KEFLEX) 500 MG capsule Take 1 capsule (500 mg total) by mouth every 12 (twelve) hours for 10 days. 20 capsule 0    docusate sodium (COLACE) 100 MG capsule Take 1 capsule (100 mg total) by mouth 2 (two) times daily. 60 capsule 2    enoxaparin (LOVENOX) 40  MG/0.4ML injection Inject 0.4 mLs (40 mg total) into the skin every 12 (twelve) hours. 24 mL 6    labetalol (NORMODYNE) 300 MG tablet Take 1 tablet (300 mg total) by mouth 2 (two) times daily. 60 tablet 2    metroNIDAZOLE (FLAGYL) 500 MG tablet Take 1 tablet (500 mg total) by mouth every 12 (twelve) hours for 10 days. (Patient not taking: Reported on 06/27/2021) 20 tablet 0    Prenatal MV & Min w/FA-DHA (PRENATAL GUMMIES) 0.18-25 MG CHEW Chew by mouth.      progesterone (PROMETRIUM) 200 MG capsule Place 1 capsule (200 mg total) vaginally at bedtime. 30 capsule 6     Review of Systems  Gastrointestinal:  Positive for abdominal pain.  Genitourinary:  Positive for vaginal discharge.  Physical Exam   Blood pressure (!) 139/52, pulse (!) 108, resp. rate 20, last menstrual period 02/04/2021.  Physical Exam Genitourinary:    Comments: Bimanual exam: tension noted on cervix, fetus in the lower uterus segment. Difficult to determine dilation. Exam by Venia Carbon, NP    MAU Course  Procedures  MDM Dr Macon Large notified.  She advised to start IV access,  give analgesia and she will come to evaluate patient.  Assessment and Plan  Care turned over to Dr. Earnie Larsson Rasch 06/29/2021, 7:21 PM     Attestation of Attending Supervision of Advanced Practice Provider (PA/CNM/NP): Evaluation and management procedures were performed by the Advanced Practice Provider under my supervision and collaboration.  I have reviewed the Advanced Practice Provider's note and chart, and I agree with the management and plan. I have also made any necessary editorial changes.  Patient presented with previable PPROM, on exam noted to have fetal head visible at external os, being held back by cerclage. Visually 3 cm dilated. Discussed poor prognosis of previable PPROM with patient and her FOB, recommended cerclage removal which would result in delivery. Verbal consent obtained.  Patient had received Morphine 4 mg IV x 1 and was feeling a lot of pressure.  On speculum exam, fetal head noted giving a lot of pressure on cerclage.  Anterior knot of cervical cerclage was recognized and pulled upwards to visualize both sides of the circumferential suture under the knot. One side was cut and the remaining suture was pulled and removed intact. Speculum was removed, and cervix was checked and found to be 5 cm dilated.  One minute after speculum was removed, patient pushed and delivered a viable fetus around 2005.  Some bleeding with clots noted.  On exam, placenta was noted to be fundal.  Cord was clamped and cut, fetus was handed over to patient.  Injected solution of 10 units of pitocin in 10 ml of NS into cord; 400 mcg misoprostol buccal ordered.  EBL ~300 ml.  Will keep NPO for now.  Continue to monitor closely.  Patient aware of risk of needing surgical evacuation for concerning bleeding, other maternal instability or prolonged placenta (>4 hours since delivery). Patient will be monitored overnight after delivery, [facility] RN in charge notified.    Jaynie Collins, MD,  FACOG Attending Obstetrician & Gynecologist, [facility] for [employer], [facility]

## 2021-06-29 NOTE — MAU Note (Signed)
Pain started at 0600, thought it was gas pain, but is rhymic- like ctxs, now every 4 min. No bleeding or leaking.

## 2021-06-29 NOTE — MAU Note (Addendum)
Patient arrived with EMS contracting. Patient is rating her pain at a 10/10. Report from EMS determined that contractions and SROM at 1730. No vaginal bleeding. N&V present.

## 2021-06-29 NOTE — MAU Provider Note (Signed)
History     CSN: [id number]  Arrival date and time: 06/29/21 1138   Event Date/Time   First Provider Initiated Contact with Patient 06/29/21 1200      Chief Complaint  Patient presents with   Contractions   Pelvic Pain   Cassandra White is a 36 y.o. G1P0 at [redacted]w[redacted]d who receives care at [facility].  She presents  today for Contractions and Pelvic Pain.  She states she started experiencing intermittent pelvic pressure and pain in her upper legs around 0600.  She states the pain is worse with laying on her side and has no relieving factors. Patient denies vaginal bleeding, leaking, or discharge.  She endorses fetal movement.  She reports taking her Labetalol this morning, but no other medications.     OB History     Gravida  1   Para  0   Term      Preterm  0   AB  0   Living  0      SAB  0   IAB      Ectopic      Multiple      Live Births  0           Past Medical History:  Diagnosis Date   Asthma    Breast disorder    lump in right breast   Chronic hypertension during pregnancy, antepartum 06/02/2021   Edema of left lower extremity    Since age 31    Fibrocystic breast changes    lump in right breast U/S 11/22/11: Multiple complicated cysts with mural nodularity as discussed above. Given the appearance, I recommend tissue sampling of the most complicated appearing lesion. If this demonstrates benign findings, recommend following the remaining lesions. Ultrasound- guided core biopsy was discussed with the patient. This will be scheduled per patient preference.  BI-RADS CATEGO   History of blood clots    right leg, left leg, right forearm   Obese    PCOS (polycystic ovarian syndrome)     Past Surgical History:  Procedure Laterality Date   BUNIONECTOMY     right foot   CERVICAL CERCLAGE N/A 06/17/2021   Procedure: CERCLAGE CERVICAL;  Surgeon: Lazaro Arms, MD;  Location: [facility] LD [facility];  Service: Gynecology;  Laterality: N/A;   COLPOSCOPY  2009/2010     Family History  Problem Relation Age of Onset   Hypertension Mother    Asthma Mother    Pulmonary embolism Mother    Deep vein thrombosis Mother    Asthma Brother    Diabetes Father    Cancer Maternal Uncle        lung   Cancer Maternal Grandmother        brain   Cancer Maternal Grandfather        pancreatic   Diabetes Paternal Grandmother    Diabetes Paternal Grandfather     Social History   Tobacco Use   Smoking status: Former    Types: Cigarettes    Quit date: 01/04/2010    Years since quitting: 11.4   Smokeless tobacco: Current  Vaping Use   Vaping Use: Never used  Substance Use Topics   Alcohol use: Not Currently    Comment: socially   Drug use: No    Allergies:  Allergies  Allergen Reactions   Aspirin Other (See Comments)    Can not take due to ulcer   Other     cucumbers   Avocado Nausea Only    Medications  Prior to Admission  Medication Sig Dispense Refill Last Dose   cephALEXin (KEFLEX) 500 MG capsule Take 1 capsule (500 mg total) by mouth every 12 (twelve) hours for 10 days. 20 capsule 0 06/29/2021   enoxaparin (LOVENOX) 40 MG/0.4ML injection Inject 0.4 mLs (40 mg total) into the skin every 12 (twelve) hours. 24 mL 6 06/28/2021   labetalol (NORMODYNE) 200 MG tablet Take 0.5 tablets (100 mg total) by mouth 2 (two) times daily. 30 tablet 1 06/29/2021   Prenatal MV & Min w/FA-DHA (PRENATAL GUMMIES) 0.18-25 MG CHEW Chew by mouth.   Past Week   progesterone (PROMETRIUM) 200 MG capsule Place 1 capsule (200 mg total) vaginally at bedtime. 30 capsule 6 06/28/2021   metroNIDAZOLE (FLAGYL) 500 MG tablet Take 1 tablet (500 mg total) by mouth every 12 (twelve) hours for 10 days. (Patient not taking: Reported on 06/27/2021) 20 tablet 0     Review of Systems  Gastrointestinal:  Positive for abdominal pain (Cramping) and constipation. Negative for diarrhea and nausea.  Genitourinary:  Negative for difficulty urinating, dysuria, vaginal bleeding and vaginal discharge.   Neurological:  Negative for dizziness, light-headedness and headaches.  Physical Exam   Blood pressure (!) 170/91, pulse (!) 112, temperature 98.8 F (37.1 C), temperature source Oral, last menstrual period 02/04/2021, SpO2 100 %.  Physical Exam Vitals reviewed. Exam conducted with a chaperone present.  Constitutional:      General: She is in acute distress.     Appearance: Normal appearance. She is obese.  HENT:     Head: Normocephalic and atraumatic.  Eyes:     Conjunctiva/sclera: Conjunctivae normal.  Cardiovascular:     Rate and Rhythm: Normal rate and regular rhythm.     Heart sounds: Normal heart sounds.  Pulmonary:     Effort: Pulmonary effort is normal. No respiratory distress.     Breath sounds: Normal breath sounds.  Abdominal:     General: Bowel sounds are normal.     Palpations: Abdomen is soft.     Tenderness: There is no abdominal tenderness.  Genitourinary:    Comments: Sterile Speculum Exam: -Normal External Genitalia: Non tender, Small amt of grayish white discharge at introitus.  -Vaginal Vault: Pink mucosa with good rugae. Scant amt of discharge, no blood noted-wet prep collected -Cervix: Difficult to visualize fully s/t patient discomfort and position. No apparent tension. Strings at posterior noted and appears intact. -Bimanual Exam: Dilation: Closed Exam by:: Gavin Pound, CNM Musculoskeletal:        General: Normal range of motion.     Cervical back: Normal range of motion.  Skin:    General: Skin is warm and dry.  Neurological:     Mental Status: She is alert and oriented to person, place, and time.  Psychiatric:        Mood and Affect: Mood normal.        Behavior: Behavior normal.        Thought Content: Thought content normal.    MAU Course  Procedures Results for orders placed or performed during the hospital encounter of 06/29/21 (from the past 24 hour(s))  Wet prep, genital     Status: Abnormal   Collection Time: 06/29/21 12:25 PM    Specimen: Cervix  Result Value Ref Range   Yeast Wet Prep HPF POC NONE SEEN NONE SEEN   Trich, Wet Prep NONE SEEN NONE SEEN   Clue Cells Wet Prep HPF POC NONE SEEN NONE SEEN   WBC, Wet Prep HPF POC >=10 (  A) <10   Sperm NONE SEEN   Comprehensive metabolic panel     Status: Abnormal   Collection Time: 06/29/21  1:02 PM  Result Value Ref Range   Sodium 134 (L) 135 - 145 mmol/L   Potassium 3.7 3.5 - 5.1 mmol/L   Chloride 104 98 - 111 mmol/L   CO2 21 (L) 22 - 32 mmol/L   Glucose, Bld 95 70 - 99 mg/dL   BUN 5 (L) 6 - 20 mg/dL   Creatinine, Ser 0.63 0.44 - 1.00 mg/dL   Calcium 8.9 8.9 - 10.3 mg/dL   Total Protein 6.5 6.5 - 8.1 g/dL   Albumin 3.0 (L) 3.5 - 5.0 g/dL   AST 25 15 - 41 U/L   ALT 22 0 - 44 U/L   Alkaline Phosphatase 40 38 - 126 U/L   Total Bilirubin 0.5 0.3 - 1.2 mg/dL   GFR, Estimated >60 >60 mL/min   Anion gap 9 5 - 15  CBC     Status: Abnormal   Collection Time: 06/29/21  1:02 PM  Result Value Ref Range   WBC 14.1 (H) 4.0 - 10.5 K/uL   RBC 3.26 (L) 3.87 - 5.11 MIL/uL   Hemoglobin 10.2 (L) 12.0 - 15.0 g/dL   HCT 30.9 (L) 36.0 - 46.0 %   MCV 94.8 80.0 - 100.0 fL   MCH 31.3 26.0 - 34.0 pg   MCHC 33.0 30.0 - 36.0 g/dL   RDW 13.0 11.5 - 15.5 %   Platelets 289 150 - 400 K/uL   nRBC 0.0 0.0 - 0.2 %  Protein / creatinine ratio, urine     Status: None   Collection Time: 06/29/21  1:15 PM  Result Value Ref Range   Creatinine, Urine 210.84 mg/dL   Total Protein, Urine 20 mg/dL   Protein Creatinine Ratio 0.09 0.00 - 0.15 mg/mg[Cre]  Urinalysis, Routine w reflex microscopic     Status: Abnormal   Collection Time: 06/29/21  1:15 PM  Result Value Ref Range   Color, Urine YELLOW YELLOW   APPearance CLEAR CLEAR   Specific Gravity, Urine 1.025 1.005 - 1.030   pH 6.5 5.0 - 8.0   Glucose, UA NEGATIVE NEGATIVE mg/dL   Hgb urine dipstick NEGATIVE NEGATIVE   Bilirubin Urine NEGATIVE NEGATIVE   Ketones, ur 15 (A) NEGATIVE mg/dL   Protein, ur NEGATIVE NEGATIVE mg/dL   Nitrite  NEGATIVE NEGATIVE   Leukocytes,Ua NEGATIVE NEGATIVE  Fern Test     Status: None   Collection Time: 06/29/21  2:54 PM  Result Value Ref Range   POCT Fern Test Negative = intact amniotic membranes     MDM Pelvic Exam Cultures: Wet Prep, GC/CT Labs: CBC, CMP, PC Ratio Measure BPQ15 min Pain Management Assessment and Plan  36 year old, G1P0  SIUP at 20.5 weeks Abdominal Cramping Constipation Cerclage CHTN  -Reviewed POC with patient. -Exam performed and findings discussed.  -Cultures collected -Reassured that cerclage showing no apparent signs of failure. -Discussed pain management and patient agreeable. -Reviewed elevated blood pressure.  Likely d/t current distress/pain. -Will also give IV fluids and collect PIH Labs.   Maryann Conners 06/29/2021, 12:00 PM   Reassessment (1:32 PM)  -Nurse unable to obtain IV line at current. -Patient reports last visit difficulty with IV was an issue as well. -Will give pain medication via IM. -Dr. Eddie North consulted and informed of patient status, evaluation, interventions, and results. Advised: *Okay to give oral procardia 10mg . *Increase oral labetalol dosing.  -  Order placed -Will await results and reassess.   Reassessment (2:55 PM) Vitals:   06/29/21 1345 06/29/21 1416 06/29/21 1431 06/29/21 1446  BP: (!) 151/83 (!) 105/46 (!) 113/50 (!) 107/52  Pulse: (!) 105 (!) 101 97 98  Temp:      TempSrc:      SpO2:       -Patient reports improved, but continued pain despite Toradol dosing. Now 7-8/10. -Discussed usage of percocet for increased improvement or resolution and patient agreeable.  -Reviewed increasing Labetalol to 300mg  BID. Patient verbalizes understanding. -Patient also reports some constipation and requests medication. -Will send in Rx for docusate sodium.  Instructed to use twice daily until regular then nightly and/or prn.   Reassessment (3:56 PM)  -Patient reports some continued pain, but improved with percocet.   -Discussed relaxation techniques for pain including rest, warm compresses, and tylenol usage prn. -Reiterated initiation of new and modifications in medications.  No questions. -Precautions given. -Instructed to follow up as scheduled. -Encouraged to call primary office or return to MAU if symptoms worsen or with the onset of new symptoms. -Discharged to home in stable condition.  Maryann Conners MSN, CNM Advanced Practice Provider, Center for [employer]

## 2021-06-29 NOTE — H&P (Signed)
History    CSN: [id number]   Arrival date and time: 06/29/21 1913    None      No chief complaint on file.   HPI   Cassandra White is a 36 y.o. female G1P0000 @ [redacted]w[redacted]d with rescue cerclage in place, here with PPROM and contractions. She was seen earlier today in MAU with pain and improved after Toradol. Patient vomited at home and felt a large gush of clear fluid. Shortly after she started having regular, painful contractions.    OB History       Gravida  1   Para  0   Term      Preterm  0   AB  0   Living  0        SAB  0   IAB      Ectopic      Multiple      Live Births  0                   Past Medical History:  Diagnosis Date   Asthma     Breast disorder      lump in right breast   Chronic hypertension during pregnancy, antepartum 06/02/2021   Edema of left lower extremity      Since age 67    Fibrocystic breast changes      lump in right breast U/S 11/22/11: Multiple complicated cysts with mural nodularity as discussed above. Given the appearance, I recommend tissue sampling of the most complicated appearing lesion. If this demonstrates benign findings, recommend following the remaining lesions. Ultrasound- guided core biopsy was discussed with the patient. This will be scheduled per patient preference.  BI-RADS CATEGO   History of blood clots      right leg, left leg, right forearm   Obese     PCOS (polycystic ovarian syndrome)             Past Surgical History:  Procedure Laterality Date   BUNIONECTOMY        right foot   CERVICAL CERCLAGE N/A 06/17/2021    Procedure: CERCLAGE CERVICAL;  Surgeon: Lazaro Arms, MD;  Location: [facility] LD ORS;  Service: Gynecology;  Laterality: N/A;   COLPOSCOPY   2009/2010           Family History  Problem Relation Age of Onset   Hypertension Mother     Asthma Mother     Pulmonary embolism Mother     Deep vein thrombosis Mother     Asthma Brother     Diabetes Father     Cancer Maternal Uncle           lung   Cancer Maternal Grandmother          brain   Cancer Maternal Grandfather          pancreatic   Diabetes Paternal Grandmother     Diabetes Paternal Grandfather        Social History         Tobacco Use   Smoking status: Former      Types: Cigarettes      Quit date: 01/04/2010      Years since quitting: 11.4   Smokeless tobacco: Current  Vaping Use   Vaping Use: Never used  Substance Use Topics   Alcohol use: Not Currently      Comment: socially   Drug use: No      Allergies:       Allergies  Allergen Reactions   Other        cucumbers   Avocado Nausea Only             Medications Prior to Admission  Medication Sig Dispense Refill Last Dose   cephALEXin (KEFLEX) 500 MG capsule Take 1 capsule (500 mg total) by mouth every 12 (twelve) hours for 10 days. 20 capsule 0     docusate sodium (COLACE) 100 MG capsule Take 1 capsule (100 mg total) by mouth 2 (two) times daily. 60 capsule 2     enoxaparin (LOVENOX) 40 MG/0.4ML injection Inject 0.4 mLs (40 mg total) into the skin every 12 (twelve) hours. 24 mL 6     labetalol (NORMODYNE) 300 MG tablet Take 1 tablet (300 mg total) by mouth 2 (two) times daily. 60 tablet 2     metroNIDAZOLE (FLAGYL) 500 MG tablet Take 1 tablet (500 mg total) by mouth every 12 (twelve) hours for 10 days. (Patient not taking: Reported on 06/27/2021) 20 tablet 0     Prenatal MV & Min w/FA-DHA (PRENATAL GUMMIES) 0.18-25 MG CHEW Chew by mouth.         progesterone (PROMETRIUM) 200 MG capsule Place 1 capsule (200 mg total) vaginally at bedtime. 30 capsule 6        Review of Systems  Gastrointestinal:  Positive for abdominal pain.  Genitourinary:  Positive for vaginal discharge.  Physical Exam    Blood pressure (!) 139/52, pulse (!) 108, resp. rate 20, last menstrual period 02/04/2021.   Physical Exam Genitourinary:    Comments: Bimanual exam: tension noted on cervix, fetus in the lower uterus segment. Difficult to determine dilation. Exam by  Venia CarbonJennifer Rasch, NP      MAU Course  Procedures   MDM Dr Macon LargeAnyanwu notified.  She advised to start IV access, give analgesia and she will come to evaluate patient.   Assessment and Plan  Care turned over to Dr. Earnie LarssonAnyanwu   Jennifer Rasch 06/29/2021, 7:21 PM        Attestation of Attending Supervision of Advanced Practice Provider (PA/CNM/NP): Evaluation and management procedures were performed by the Advanced Practice Provider under my supervision and collaboration.  I have reviewed the Advanced Practice Provider's note and chart, and I agree with the management and plan. I have also made any necessary editorial changes.   Patient presented with previable PPROM, on exam noted to have fetal head visible at external os, being held back by cerclage. Visually 3 cm dilated. Discussed poor prognosis of previable PPROM with patient and her FOB, recommended cerclage removal which would result in delivery. Verbal consent obtained.   Patient had received Morphine 4 mg IV x 1 and was feeling a lot of pressure.  On speculum exam, fetal head noted giving a lot of pressure on cerclage.  Anterior knot of cervical cerclage was recognized and pulled upwards to visualize both sides of the circumferential suture under the knot. One side was cut and the remaining suture was pulled and removed intact. Speculum was removed, and cervix was checked and found to be 5 cm dilated.   One minute after speculum was removed, patient pushed and delivered a viable fetus around 2005.  Some bleeding with clots noted.  On exam, placenta was noted to be fundal.  Cord was clamped and cut, fetus was handed over to patient.  Injected solution of 10 units of pitocin in 10 ml of NS into cord; 400 mcg misoprostol buccal ordered.  EBL ~300 ml.   Will  keep NPO for now.  Continue to monitor closely.  Patient aware of risk of needing surgical evacuation for concerning bleeding, other maternal instability or prolonged placenta (>4 hours since  delivery). Patient will be monitored overnight after delivery, OBSC RN in charge notified. Orders placed.  Appropriate support given to patient and her FOB.       Jaynie Collins, MD, FACOG Attending Obstetrician & Gynecologist, [facility] for [employer], [facility]

## 2021-06-30 ENCOUNTER — Other Ambulatory Visit: Payer: Self-pay

## 2021-06-30 ENCOUNTER — Encounter (HOSPITAL_COMMUNITY): Payer: Self-pay | Admitting: Obstetrics & Gynecology

## 2021-06-30 ENCOUNTER — Ambulatory Visit: Payer: BC Managed Care – PPO | Admitting: Clinical

## 2021-06-30 DIAGNOSIS — O42912 Preterm premature rupture of membranes, unspecified as to length of time between rupture and onset of labor, second trimester: Secondary | ICD-10-CM | POA: Diagnosis present

## 2021-06-30 DIAGNOSIS — F4321 Adjustment disorder with depressed mood: Secondary | ICD-10-CM

## 2021-06-30 DIAGNOSIS — O99892 Other specified diseases and conditions complicating childbirth: Secondary | ICD-10-CM | POA: Diagnosis present

## 2021-06-30 DIAGNOSIS — Z7901 Long term (current) use of anticoagulants: Secondary | ICD-10-CM | POA: Diagnosis not present

## 2021-06-30 DIAGNOSIS — O3432 Maternal care for cervical incompetence, second trimester: Secondary | ICD-10-CM | POA: Diagnosis present

## 2021-06-30 DIAGNOSIS — O9081 Anemia of the puerperium: Secondary | ICD-10-CM | POA: Diagnosis not present

## 2021-06-30 DIAGNOSIS — K5900 Constipation, unspecified: Secondary | ICD-10-CM | POA: Diagnosis present

## 2021-06-30 DIAGNOSIS — Z3A20 20 weeks gestation of pregnancy: Secondary | ICD-10-CM | POA: Diagnosis not present

## 2021-06-30 DIAGNOSIS — Z86718 Personal history of other venous thrombosis and embolism: Secondary | ICD-10-CM | POA: Diagnosis not present

## 2021-06-30 DIAGNOSIS — D649 Anemia, unspecified: Secondary | ICD-10-CM | POA: Diagnosis present

## 2021-06-30 DIAGNOSIS — Z87891 Personal history of nicotine dependence: Secondary | ICD-10-CM | POA: Diagnosis not present

## 2021-06-30 DIAGNOSIS — O1002 Pre-existing essential hypertension complicating childbirth: Secondary | ICD-10-CM | POA: Diagnosis present

## 2021-06-30 DIAGNOSIS — D62 Acute posthemorrhagic anemia: Secondary | ICD-10-CM | POA: Diagnosis not present

## 2021-06-30 DIAGNOSIS — O4290 Premature rupture of membranes, unspecified as to length of time between rupture and onset of labor, unspecified weeks of gestation: Secondary | ICD-10-CM

## 2021-06-30 DIAGNOSIS — O3430 Maternal care for cervical incompetence, unspecified trimester: Secondary | ICD-10-CM

## 2021-06-30 LAB — COMPREHENSIVE METABOLIC PANEL
ALT: 18 U/L (ref 0–44)
AST: 20 U/L (ref 15–41)
Albumin: 2.8 g/dL — ABNORMAL LOW (ref 3.5–5.0)
Alkaline Phosphatase: 35 U/L — ABNORMAL LOW (ref 38–126)
Anion gap: 8 (ref 5–15)
BUN: 5 mg/dL — ABNORMAL LOW (ref 6–20)
CO2: 20 mmol/L — ABNORMAL LOW (ref 22–32)
Calcium: 8.6 mg/dL — ABNORMAL LOW (ref 8.9–10.3)
Chloride: 104 mmol/L (ref 98–111)
Creatinine, Ser: 0.74 mg/dL (ref 0.44–1.00)
GFR, Estimated: >60 mL/min (ref >60)
Glucose, Bld: 130 mg/dL — ABNORMAL HIGH (ref 70–99)
Potassium: 3.7 mmol/L (ref 3.5–5.1)
Sodium: 132 mmol/L — ABNORMAL LOW (ref 135–145)
Total Bilirubin: 0.7 mg/dL (ref 0.3–1.2)
Total Protein: 6.2 g/dL — ABNORMAL LOW (ref 6.5–8.1)

## 2021-06-30 LAB — CBC
HCT: 23.3 % — ABNORMAL LOW (ref 36.0–46.0)
HCT: 27.2 % — ABNORMAL LOW (ref 36.0–46.0)
Hemoglobin: 7.9 g/dL — ABNORMAL LOW (ref 12.0–15.0)
Hemoglobin: 9.4 g/dL — ABNORMAL LOW (ref 12.0–15.0)
MCH: 32.2 pg (ref 26.0–34.0)
MCH: 31.5 pg (ref 26.0–34.0)
MCHC: 33.9 g/dL (ref 30.0–36.0)
MCHC: 34.6 g/dL (ref 30.0–36.0)
MCV: 95.1 fL (ref 80.0–100.0)
MCV: 91.3 fL (ref 80.0–100.0)
Platelets: 308 10*3/uL (ref 150–400)
Platelets: 293 10*3/uL (ref 150–400)
RBC: 2.45 MIL/uL — ABNORMAL LOW (ref 3.87–5.11)
RBC: 2.98 MIL/uL — ABNORMAL LOW (ref 3.87–5.11)
RDW: 13.2 % (ref 11.5–15.5)
RDW: 15.7 % — ABNORMAL HIGH (ref 11.5–15.5)
WBC: 21.8 10*3/uL — ABNORMAL HIGH (ref 4.0–10.5)
WBC: 16 10*3/uL — ABNORMAL HIGH (ref 4.0–10.5)
nRBC: 0 % (ref 0.0–0.2)
nRBC: 0 % (ref 0.0–0.2)

## 2021-06-30 LAB — RPR: RPR Ser Ql: NONREACTIVE

## 2021-06-30 LAB — GC/CHLAMYDIA PROBE AMP (~~LOC~~) NOT AT ARMC
Chlamydia: NEGATIVE
Comment: NEGATIVE
Comment: NORMAL
Neisseria Gonorrhea: NEGATIVE

## 2021-06-30 LAB — PREPARE RBC (CROSSMATCH)

## 2021-06-30 MED ORDER — FUROSEMIDE 10 MG/ML IJ SOLN
20.0000 mg | Freq: Once | INTRAMUSCULAR | Status: AC
  Administered 2021-06-30: 20 mg via INTRAVENOUS
  Filled 2021-06-30: qty 2

## 2021-06-30 MED ORDER — DIPHENHYDRAMINE HCL 25 MG PO CAPS
25.0000 mg | ORAL_CAPSULE | Freq: Once | ORAL | Status: AC
  Administered 2021-06-30: 25 mg via ORAL
  Filled 2021-06-30: qty 1

## 2021-06-30 MED ORDER — ACETAMINOPHEN 500 MG PO TABS
1000.0000 mg | ORAL_TABLET | Freq: Once | ORAL | Status: AC
  Administered 2021-06-30: 1000 mg via ORAL
  Filled 2021-06-30: qty 2

## 2021-06-30 MED ORDER — SODIUM CHLORIDE 0.9% IV SOLUTION
Freq: Once | INTRAVENOUS | Status: AC
  Administered 2021-06-30: 10 mL/h via INTRAVENOUS

## 2021-06-30 MED ORDER — IBUPROFEN 600 MG PO TABS: 600.0000 mg | ORAL_TABLET | Freq: Four times a day (QID) | ORAL | 0 refills | Status: DC | PRN

## 2021-06-30 NOTE — Progress Notes (Deleted)
Medical Nutrition Therapy  Appointment Start time:  ***  Appointment End time:  ***  Primary concerns today: ***  Referral diagnosis: *** Preferred learning style: *** (auditory, visual, hands on, no preference indicated) Learning readiness: *** (not ready, contemplating, ready, change in progress)   NUTRITION ASSESSMENT   Anthropometrics  Wt Readings from Last 3 Encounters:  06/29/21 (!) 370 lb 6 oz (168 kg)  06/27/21 (!) 372 lb (168.7 kg)  06/16/21 (!) 373 lb 0.3 oz (169.2 kg)      Clinical Medical Hx: *** Medications: *** Labs: *** Notable Signs/Symptoms: ***  Lifestyle & Dietary Hx ***  Estimated daily fluid intake: *** oz Supplements: *** Sleep: *** Stress / self-care: *** Current average weekly physical activity: ***  24-Hr Dietary Recall First Meal: *** Snack: *** Second Meal: *** Snack: *** Third Meal: *** Snack: *** Beverages: ***  Estimated Energy Needs Calories: *** Carbohydrate: ***g Protein: ***g Fat: ***g   NUTRITION DIAGNOSIS  {CHL AMB NUTRITIONAL DIAGNOSIS:[id number]}   NUTRITION INTERVENTION  Nutrition education (E-1) on the following topics:  ***  Handouts Provided Include  ***  Learning Style & Readiness for Change Teaching method utilized: Visual & Auditory  Demonstrated degree of understanding via: Teach Back  Barriers to learning/adherence to lifestyle change: ***  Goals Established by Pt ***   MONITORING & EVALUATION Dietary intake, weekly physical activity, and *** in ***.  Next Steps  Patient is to ***.
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
## 2021-06-30 NOTE — Progress Notes (Signed)
Chaplain met with pt and FOB this morning to introduce spiritual care and offer support. Chaplain asked open ended questions to facilitate emotional expression and story telling. Cassandra White shared that they have a 34 old daughter, Cassandra White, and found out about this pregnancy shortly after. She had a cerclage on the 30th and was seen yesterday for some pain she was experiencing, but was reassured that nothing was wrong. She returned when she felt very sick and while using the bathroom her water broke. 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 Cassandra White is currently on leave. The couple would like more information on funeral planning. Chaplain offered initial education on local resources, including financial resources, and invited family to consider ways they may want to memorialize their daughter and create memories with her while they are here. They stated they were asked about foot molds and they would like to have those done.  Chaplain followed up this afternoon. Cassandra White has received her second bag of blood and is feeling better physically, but acknowledges that emotionally she is still in a daze. Chaplain normalized this response. Cassandra White's mother is now present. Chaplain provided grief education and resources for support for all members of the family, including information about registering for the [facility] support group. Pt also requested a funeral home list, which I supplied. Cassandra White says she may go home today and feels mostly good about that plan, but wants to be sure that she does not have to return as she struggles with anemia. Chaplain encouraged Cassandra White to self advocate and name those concerns and requests to her provider.   Pt and family are aware that continued chaplain support is available beyond discharge and are aware of how to reach the chaplain.  Please page as further needs arise.  Donald Prose. Elyn Peers, M.Div. [facility] Chaplain Pager [phone] Office [phone]

## 2021-06-30 NOTE — Discharge Summary (Signed)
Postpartum Discharge Summary  Date of Service updated 06/30/2021      Patient Name: Cassandra White DOB: 1986-05-10 MRN: [id number]  Date of admission: 06/29/2021 Delivery date:06/29/2021  Delivering provider: Verita Schneiders A  Date of discharge: 06/30/2021  Admitting diagnosis: Cervical incompetence, with delivery [O34.30] Intrauterine pregnancy: [redacted]w[redacted]d    Secondary diagnosis:  Principal Problem:   Cervical incompetence, delivered Active Problems:   Anticoagulant long-term use   Chronic hypertension during pregnancy, antepartum   Cervical insufficiency during pregnancy in second trimester, antepartum   Cervical incompetence, with delivery   Symptomatic anemia   Postpartum hemorrhage  Additional problems: hemorrhage    Discharge diagnosis: Preterm Pregnancy Delivered, Anemia, and PPH                                              Post partum procedures:blood transfusion Augmentation: N/A Complications: None  Hospital course: Onset of Labor With Vaginal Delivery      36y.o. yo G1P0000 at 236w5das admitted in Active Labor on 06/29/2021. Cassandra White a 89.o. female G1P0000 @ 2066w5dth rescue cerclage in place, here with PPROM and contractions. She was seen earlier today in MAU with pain and improved after Toradol. Patient vomited at home and felt a large gush of clear fluid. Shortly after she started having regular, painful contractions. Her cervix was dilated and the cerclage was removed and delivery followed shortly along with PPH Manual suction curettage was done and hemorrhage was controlled Membrane Rupture Time/Date:  ,   Delivery Method:Vaginal, Spontaneous  Episiotomy: None  Lacerations:  None  She had symptomatic anemia after vaginal delivery of a non viable fetus.  She is ambulating, tolerating a regular diet, passing flatus, and urinating well. Patient is discharged home in stable condition on 06/30/21.  Newborn Data: Birth date:06/29/2021  Birth time:8:05  PM  Gender:Female  Living status:Living  Apgars:2 ,2  Weight:36 g   Magnesium Sulfate received: No BMZ received: No Rhophylac:N/A MMR:No T-DaP: Flu: No Transfusion:Yes  Physical exam  Vitals:   06/30/21 1021 06/30/21 1026 06/30/21 1233 06/30/21 1545  BP:  (!) 99/58 111/71 122/73  Pulse:  (!) 104 (!) 113 95  Resp:   18 18  Temp:  98.6 F (37 C) 98.6 F (37 C) 98.5 F (36.9 C)  TempSrc:  Oral Oral Oral  SpO2: 100% 96% 96% 99%  Weight:      Height:       General: alert, cooperative, and no distress Lochia: appropriate Uterine Fundus: firm Incision: N/A DVT Evaluation: No evidence of DVT seen on physical exam. Labs: Lab Results  Component Value Date   WBC 16.0 (H) 06/30/2021   HGB 9.4 (L) 06/30/2021   HCT 27.2 (L) 06/30/2021   MCV 91.3 06/30/2021   PLT 293 06/30/2021   CMP Latest Ref Rng & Units 06/29/2021  Glucose 70 - 99 mg/dL 130(H)  BUN 6 - 20 mg/dL 5(L)  Creatinine 0.44 - 1.00 mg/dL 0.74  Sodium 135 - 145 mmol/L 132(L)  Potassium 3.5 - 5.1 mmol/L 3.7  Chloride 98 - 111 mmol/L 104  CO2 22 - 32 mmol/L 20(L)  Calcium 8.9 - 10.3 mg/dL 8.6(L)  Total Protein 6.5 - 8.1 g/dL 6.2(L)  Total Bilirubin 0.3 - 1.2 mg/dL 0.7  Alkaline Phos 38 - 126 U/L 35(L)  AST 15 - 41 U/L 20  ALT 0 - 44 U/L 18   Edinburgh Score: No flowsheet data found.   After visit meds:  Allergies as of 06/30/2021       Reactions   Other    cucumbers   Avocado Nausea Only        Medication List     STOP taking these medications    cephALEXin 500 MG capsule Commonly known as: KEFLEX   docusate sodium 100 MG capsule Commonly known as: COLACE   metroNIDAZOLE 500 MG tablet Commonly known as: FLAGYL       TAKE these medications    enoxaparin 40 MG/0.4ML injection Commonly known as: LOVENOX Inject 0.4 mLs (40 mg total) into the skin every 12 (twelve) hours.   ibuprofen 600 MG tablet Commonly known as: ADVIL Take 1 tablet (600 mg total) by mouth every 6 (six) hours as  needed for fever or headache.   labetalol 300 MG tablet Commonly known as: NORMODYNE Take 1 tablet (300 mg total) by mouth 2 (two) times daily.   Prenatal Gummies 0.18-25 MG Chew Chew by mouth.         Discharge home in stable condition Infant Feeding: n/a Infant [place] Discharge instruction: per After Visit Summary and Postpartum booklet. Activity: Advance as tolerated. Pelvic rest for 6 weeks.  Diet: routine diet Future Appointments: Future Appointments  Date Time Provider [place]  07/13/2021  1:45 PM [place] [facility]  07/14/2021  9:55 AM Griffin Basil, MD [facility]   Follow up Visit:  Lynn Follow up in 2 week(s).   Specialty: Obstetrics and Gynecology Contact information: [address] [place] [phone]                 Please schedule this patient for a In person postpartum visit in  2 weeks  with the following provider: MD. Additional Postpartum F/U:Postpartum Depression checkup  High risk pregnancy complicated by: HTN and incompetent cervix Delivery mode:  Vaginal, Spontaneous  Anticipated Birth Control:  Unsure   06/30/2021 Emeterio Reeve, MD

## 2021-06-30 NOTE — Progress Notes (Signed)
Transfusion complete

## 2021-06-30 NOTE — Progress Notes (Signed)
CSW received consult due to IUFD.  CSW available for support secondary to Spiritual Care Services and will await call from Chaplain before becoming involved.  CSW screening out referral at this time.  Talton Delpriore Boyd-Gilyard, MSW, LCSW Clinical Social Work [phone]  

## 2021-06-30 NOTE — Progress Notes (Signed)
Patient transferred to [facility] from MAU in stable condition. Patient is resting. Fetus is rooming in with mom in the bassinet. Charge nurse resumed care. Patient's nurse will call for report.

## 2021-06-30 NOTE — Progress Notes (Addendum)
Blood transfusion initiated at 0653, first unit. Unit # [id number], expire 07-14-21.  Witness by Vonzella Nipple, RN at rate 140cc/h first 15 minutes.  At 0708 rate increased 150. See flow sheet for vital signs.

## 2021-07-01 LAB — BPAM RBC
Blood Product Expiration Date: 202301262359
Blood Product Expiration Date: 202302062359
ISSUE DATE / TIME: 202301120624
ISSUE DATE / TIME: 202301120954
Unit Type and Rh: 6200
Unit Type and Rh: 6200

## 2021-07-01 LAB — TOXOPLASMA GONDII ANTIBODY, IGG: Toxoplasma IgG Ratio: <3 IU/mL (ref 0.0–7.1)

## 2021-07-01 LAB — TYPE AND SCREEN
ABO/RH(D): A POS
Antibody Screen: NEGATIVE
Unit division: 0
Unit division: 0

## 2021-07-01 LAB — SURGICAL PATHOLOGY

## 2021-07-01 LAB — HSV 2 ANTIBODY, IGG: HSV 2 Glycoprotein G Ab, IgG: 12.6 index — ABNORMAL HIGH (ref 0.00–0.90)

## 2021-07-01 LAB — CMV ANTIBODY, IGG (EIA): CMV Ab - IgG: 1.2 U/mL — ABNORMAL HIGH (ref 0.00–0.59)

## 2021-07-01 LAB — HSV 1 ANTIBODY, IGG: HSV 1 Glycoprotein G Ab, IgG: 1.32 index — ABNORMAL HIGH (ref 0.00–0.90)

## 2021-07-01 LAB — RUBELLA SCREEN: Rubella: 1.96 index (ref >0.99)

## 2021-07-04 ENCOUNTER — Ambulatory Visit: Payer: BC Managed Care – PPO

## 2021-07-04 NOTE — BH Specialist Note (Deleted)
Integrated Behavioral Health via Telemedicine Visit  07/04/2021 Cassandra White [id number]  Number of Integrated Behavioral Health visits: *** Session Start time: 1:45***  Session End time: 2:45*** Total time: {IBH Total Time:21014050}  Referring Provider: *** Patient/Family location: Home*** [facility] Provider location: [facility]  All persons participating in visit: Patient *** and [facility] White ***  Types of Service: {CHL AMB TYPE OF SERVICE:[phone]}  I connected with Cassandra White and/or Cassandra White's {family members:20773} via  Telephone or Engineer, civil (consulting)  (Video is Caregility application) and verified that I am speaking with the correct person using two identifiers. Discussed confidentiality: {YES/NO:21197}  I discussed the limitations of telemedicine and the availability of in person appointments.  Discussed there is a possibility of technology failure and discussed alternative modes of communication if that failure occurs.  I discussed that engaging in this telemedicine visit, they consent to the provision of behavioral healthcare and the services will be billed under their insurance.  Patient and/or legal guardian expressed understanding and consented to Telemedicine visit: {YES/NO:21197}  Presenting Concerns: Patient and/or family reports the following symptoms/concerns: *** Duration of problem: ***; Severity of problem: {Mild/Moderate/Severe:20260}  Patient and/or Family's Strengths/Protective Factors: {CHL AMB BH PROTECTIVE FACTORS:[phone]}  Goals Addressed: Patient will:  Reduce symptoms of: {IBH Symptoms:21014056}   Increase knowledge and/or ability of: {IBH Patient Tools:21014057}   Demonstrate ability to: {IBH Goals:21014053}  Progress towards Goals: {CHL AMB BH PROGRESS TOWARDS GOALS:[phone]}  Interventions: Interventions utilized:  {IBH  Interventions:21014054} Standardized Assessments completed: {IBH Screening Tools:21014051}  Patient and/or Family Response: ***  Assessment: Patient currently experiencing ***.   Patient may benefit from ***.  Plan: Follow up with behavioral health clinician on : *** Behavioral recommendations: *** Referral(s): {IBH Referrals:21014055}  I discussed the assessment and treatment plan with the patient and/or parent/guardian. They were provided an opportunity to ask questions and all were answered. They agreed with the plan and demonstrated an understanding of the instructions.   They were advised to call back or seek an in-person evaluation if the symptoms worsen or if the condition fails to improve as anticipated.  Cassandra Close Romulus Hanrahan, LCSW

## 2021-07-06 NOTE — BH Specialist Note (Signed)
Integrated Behavioral Health via Telemedicine Visit  07/06/2021 Cassandra White [id number]  Number of [place] visits: 2 Session Start time: 9:15  Session End time: 9:50 Total time: 35   Referring Provider: Arlina Robes, MD Patient/Family location: Home [facility] [place] Provider location: Center for [place] [facility]  All persons participating in visit: Patient Cassandra White and Cassandra White   Types of Service: Individual psychotherapy and Video visit  I connected with Cassandra White and/or Cassandra White  n/a  via  Telephone or Video Enabled Telemedicine Application  (Video is Caregility application) and verified that I am speaking with the correct person using two identifiers. Discussed confidentiality: Yes   I discussed the limitations of telemedicine and the availability of in person appointments.  Discussed there is a possibility of technology failure and discussed alternative modes of communication if that failure occurs.  I discussed that engaging in this telemedicine visit, they consent to the provision of behavioral healthcare and the services will be billed under their insurance.  Patient and/or legal guardian expressed understanding and consented to Telemedicine visit: Yes   Presenting Concerns: Patient and/or family reports the following symptoms/concerns: Grieving loss of pregnancy, along with fear of losing almost 21mo niece(in process of adopting her/worries birth mother will change her mind) Duration of problem: After loss; Severity of problem: severe  Patient and/or Family's Strengths/Protective Factors: Social connections, Concrete supports in place (healthy food, safe environments, etc.), and Sense of purpose  Goals Addressed: Patient will:  Reduce symptoms of: anxiety, depression, and stress   Increase knowledge and/or ability of: healthy habits   Demonstrate ability to: Increase healthy adjustment to current  life circumstances and Increase adequate support systems for patient/family  Progress towards Goals: Ongoing  Interventions: Interventions utilized:  Psychoeducation and/or Health Education, Link to [employer], and Supportive Reflection Standardized Assessments completed: GAD-7 and PHQ 9  Patient and/or Family Response: Pt agrees with treatment plan  Assessment: Patient currently experiencing Grief  and Adjustment disorder with mixed anxious and depressed mood.   Patient may benefit from psychoeducation and brief therapeutic interventions regarding coping with symptoms of depression, anxiety, as relates to current grief and life stress.   Plan: Follow up with behavioral health clinician on : Two weeks Behavioral recommendations:  -Continue prioritizing healthy sleeping and regular mealtimes for overall wellness and self-care during this time -Continue plan to attend at least one pregnancy loss support group at [email or web address].net or [email or web address]  -Continue with custody court, with goal of permanent custody of biological niece/adopted daughter Referral(s): [place] (In Clinic) and [employer]:  pregnancy loss support  I discussed the assessment and treatment plan with the patient and/or parent/guardian. They were provided an opportunity to ask questions and all were answered. They agreed with the plan and demonstrated an understanding of the instructions.   They were advised to call back or seek an in-person evaluation if the symptoms worsen or if the condition fails to improve as anticipated.  Cassandra Fair, LCSW  Depression screen [facility] 2/9 07/11/2021 06/16/2021 06/02/2021 05/02/2021 08/13/2017  Decreased Interest 3 1 3  0 0  Down, Depressed, Hopeless 3 0 0 0 0  PHQ - 2 Score 6 1 3  0 0  Altered sleeping 3 1 1 2  -  Tired, decreased energy 3 1 2 3  -  Change in appetite 0 0 0 0 -  Feeling bad or failure about yourself  3 0 0 1 -   Trouble concentrating 0 0  0 0 -  Moving slowly or fidgety/restless 3 0 0 0 -  Suicidal thoughts 1 0 0 0 -  PHQ-9 Score 19 3 6 6  -   GAD 7 : Generalized Anxiety Score 07/11/2021 06/16/2021 06/02/2021 05/02/2021  Nervous, Anxious, on Edge 1 0 0 3  Control/stop worrying 3 1 1 3   Worry too much - different things 3 1 1 3   Trouble relaxing 3 0 1 0  Restless 3 0 0 0  Easily annoyed or irritable 3 1 1 2   Afraid - awful might happen 3 1 1 1   Total GAD 7 Score 19 4 5  12

## 2021-07-11 ENCOUNTER — Ambulatory Visit: Payer: BC Managed Care – PPO | Admitting: Clinical

## 2021-07-11 DIAGNOSIS — F4323 Adjustment disorder with mixed anxiety and depressed mood: Secondary | ICD-10-CM

## 2021-07-11 DIAGNOSIS — F4321 Adjustment disorder with depressed mood: Secondary | ICD-10-CM

## 2021-07-11 NOTE — Patient Instructions (Signed)
Center for [facility] at [facility] [address] [place], [place] [address] [phone] (main office) [phone] (Refugio Vandevoorde's office)  Pregnancy Loss Support Groups at: [email or web address]  [email or web address]

## 2021-07-12 NOTE — BH Specialist Note (Deleted)
Integrated Behavioral Health via Telemedicine Visit  07/12/2021 Cassandra White [id number]  Number of Integrated Behavioral Health visits: *** Session Start time: 10:45***  Session End time: 11:15*** Total time: {IBH Total Time:21014050}  Referring Provider: *** Patient/Family location: Home*** [facility] Provider location: [facility]  All persons participating in visit: Patient *** and [facility] Lennin Osmond ***  Types of Service: {CHL AMB TYPE OF SERVICE:[phone]}  I connected with [name] and/or Alcario Drought Carandang's {family members:20773} via  Telephone or Engineer, civil (consulting)  (Video is Caregility application) and verified that I am speaking with the correct person using two identifiers. Discussed confidentiality: {YES/NO:21197}  I discussed the limitations of telemedicine and the availability of in person appointments.  Discussed there is a possibility of technology failure and discussed alternative modes of communication if that failure occurs.  I discussed that engaging in this telemedicine visit, they consent to the provision of behavioral healthcare and the services will be billed under their insurance.  Patient and/or legal guardian expressed understanding and consented to Telemedicine visit: {YES/NO:21197}  Presenting Concerns: Patient and/or family reports the following symptoms/concerns: *** Duration of problem: ***; Severity of problem: {Mild/Moderate/Severe:20260}  Patient and/or Family's Strengths/Protective Factors: {CHL AMB BH PROTECTIVE [phone]}  Goals Addressed: Patient will:  Reduce symptoms of: {IBH Symptoms:21014056}   Increase knowledge and/or ability of: {IBH Patient Tools:21014057}   Demonstrate ability to: {IBH Goals:21014053}  Progress towards Goals: {CHL AMB BH PROGRESS TOWARDS GOALS:[id number]}  Interventions: Interventions utilized:  {IBH  Interventions:21014054} Standardized Assessments completed: {IBH Screening Tools:21014051}  Patient and/or Family Response: ***  Assessment: Patient currently experiencing ***.   Patient may benefit from ***.  Plan: Follow up with behavioral health clinician on : *** Behavioral recommendations: *** Referral(s): {IBH Referrals:21014055}  I discussed the assessment and treatment plan with the patient and/or parent/guardian. They were provided an opportunity to ask questions and all were answered. They agreed with the plan and demonstrated an understanding of the instructions.   They were advised to call back or seek an in-person evaluation if the symptoms worsen or if the condition fails to improve as anticipated.  Valetta Close Alvie Speltz, LCSW

## 2021-07-13 ENCOUNTER — Telehealth: Payer: Self-pay | Admitting: Lactation Services

## 2021-07-13 NOTE — Telephone Encounter (Signed)
Called patient at request of Vesta Mixer, [facility]. Patient did not answer. Mailbox full and not able to leave a message. Will send My Chart message and try to call back at a later time.

## 2021-07-13 NOTE — Telephone Encounter (Signed)
-----   Message from Cassandra White, [place] sent at 07/11/2021  5:22 PM EST ----- Pt wants appointment; lost pregnancy, but is producing milk. She has a 75mo she is in process of adopting (does not breastfeed; when 77mo cries, her milk lets down, and just makes her sad, without baby here to feed). Nothing has worked yet to dry up the milk.

## 2021-07-14 ENCOUNTER — Encounter: Payer: Self-pay | Admitting: Family Medicine

## 2021-07-14 ENCOUNTER — Ambulatory Visit (INDEPENDENT_AMBULATORY_CARE_PROVIDER_SITE_OTHER): Payer: BC Managed Care – PPO | Admitting: Obstetrics and Gynecology

## 2021-07-14 ENCOUNTER — Other Ambulatory Visit: Payer: Self-pay

## 2021-07-14 ENCOUNTER — Ambulatory Visit: Payer: 59

## 2021-07-14 ENCOUNTER — Telehealth: Payer: Self-pay

## 2021-07-14 VITALS — BP 135/78 | HR 96 | Wt 364.2 lb

## 2021-07-14 DIAGNOSIS — D649 Anemia, unspecified: Secondary | ICD-10-CM

## 2021-07-14 DIAGNOSIS — O3430 Maternal care for cervical incompetence, unspecified trimester: Secondary | ICD-10-CM

## 2021-07-14 LAB — CBC
Hematocrit: 29.5 % — ABNORMAL LOW (ref 34.0–46.6)
Hemoglobin: 9.7 g/dL — ABNORMAL LOW (ref 11.1–15.9)
MCH: 29.5 pg (ref 26.6–33.0)
MCHC: 32.9 g/dL (ref 31.5–35.7)
MCV: 90 fL (ref 79–97)
Platelets: 416 10*3/uL (ref 150–450)
RBC: 3.29 x10E6/uL — ABNORMAL LOW (ref 3.77–5.28)
RDW: 13.5 % (ref 11.7–15.4)
WBC: 6.5 10*3/uL (ref 3.4–10.8)

## 2021-07-14 MED ORDER — FERROUS SULFATE 325 (65 FE) MG PO TABS: 325.0000 mg | ORAL_TABLET | ORAL | 3 refills | Status: DC

## 2021-07-14 NOTE — Telephone Encounter (Signed)
Iron supplement not received by patient's preferred pharmacy. Provider notified by Epic that prescription was not received. Called pharmacy to follow up; message heard that states pharmacy is temporarily closed. Called pt for secondary pharmacy preference. Medication reordered.

## 2021-07-14 NOTE — Progress Notes (Signed)
°  CC: miscarriage follow up Subjective:    Patient ID: Cassandra White, female    DOB: 07/25/1985, 36 y.o.   MRN: [id number]  HPI  Pt is seen s/p fetal loss at 20 weeks and 5 days.  The patient was delivered vaginally but then had retained placenta and postpartum hemorrhage. She did receive 2 units of PRBC before discharge, but still feels tired.  She  has corresponded with the behavorial health specialist and will see her again in the future.  The patient states she feels tired and has had suicidal ideation without a plan.  She feels the need to be present for her niece which she has adopted.  Pt was offered zoloft and she declined as she did not like the way it made her feel previously.  Pt did desire check of h/h since she was slightly anemic when she left the hospital. She also desired a note to return to work as she financial obligations currently.   Review of Systems     Objective:   Physical Exam Vitals:   07/14/21 1018  BP: 135/78  Pulse: 96         Assessment & Plan:   1. Anemia, unspecified type  - CBC - ferrous sulfate (FERROUSUL) 325 (65 FE) MG tablet; Take 1 tablet (325 mg total) by mouth every other day.  Dispense: 30 tablet; Refill: 3  2. Cervical incompetence, with delivery Follow up as needed.  Continue with behavorial health. Pt advised to contact provider if she feels like she will hurt herself or someone else.  I spent 10 minutes dedicated to the care of this patient including previsit review of records, face to face time with the patient discussing her delivery, possible behavorial health issues and post visit testing.     Warden Fillers, MD Faculty Attending, Center for [facility]

## 2021-07-14 NOTE — Progress Notes (Signed)
Patient has elevated [place]. Patient has met with [facility] and has future appointment set up.   Paulina Fusi, RN 07/14/21

## 2021-07-19 NOTE — Progress Notes (Signed)
Acute on chronic anemia due to blood loss

## 2021-07-27 NOTE — BH Specialist Note (Signed)
Integrated Behavioral Health via Telemedicine Visit  08/10/2021 Robbin Loughmiller [id number]  Number of Integrated Behavioral Health Clinician visits: 3- Third Visit  Session Start time: 1046   Session End time: 1053  Total time in minutes: 7   Referring Provider: Nettie Elm, MD Patient/Family location: Hair salon in [facility] Provider location: Center for [facility]  All persons participating in visit: Patient Cassandra White and Cassandra White   Types of Service: Individual psychotherapy and Video visit  I connected with Cassandra White and/or Cassandra White  n/a  via  Telephone or Video Enabled Telemedicine Application  (Video is Caregility application) and verified that I am speaking with the correct person using two identifiers. Discussed confidentiality: Yes   I discussed the limitations of telemedicine and the availability of in person appointments.  Discussed there is a possibility of technology failure and discussed alternative modes of communication if that failure occurs.  I discussed that engaging in this telemedicine visit, they consent to the provision of behavioral healthcare and the services will be billed under their insurance.  Patient and/or legal guardian expressed understanding and consented to Telemedicine visit: Yes   Presenting Concerns: Patient and/or family reports the following symptoms/concerns: Stress regarding custody court date changes, ongoing grief; actively attending group grief counseling weekly Duration of problem: Post-loss; Severity of problem:  moderately severe  Patient and/or Family's Strengths/Protective Factors: Social connections, Concrete supports in place (healthy food, safe environments, etc.), and Sense of purpose  Goals Addressed: Patient will:  Reduce symptoms of: anxiety, depression, and stress   Progress towards Goals: Ongoing  Interventions: Interventions utilized:   Supportive Reflection Standardized Assessments completed:  Unable to complete due to setting  Patient and/or Family Response: Pt requests follow up in two weeks  Assessment: Patient currently experiencing Grief and Adjustment disorder with mixed anxious and depressed mood  Patient may benefit from brief therapeutic interventions continued.  Plan: Follow up with behavioral health clinician on : Two weeks Behavioral recommendations:  -Continue self-care day (hair; nails) today; continue attending weekly group support, upcoming custody court Referral(s): Integrated [employer] (In Clinic)  I discussed the assessment and treatment plan with the patient and/or parent/guardian. They were provided an opportunity to ask questions and all were answered. They agreed with the plan and demonstrated an understanding of the instructions.   They were advised to call back or seek an in-person evaluation if the symptoms worsen or if the condition fails to improve as anticipated.  Rae Lips, LCSW  Depression screen [facility] 2/9 07/14/2021 07/11/2021 06/16/2021 06/02/2021 05/02/2021  Decreased Interest 3 3 1 3  0  Down, Depressed, Hopeless 3 3 0 0 0  PHQ - 2 Score 6 6 1 3  0  Altered sleeping 1 3 1 1 2   Tired, decreased energy 3 3 1 2 3   Change in appetite 0 0 0 0 0  Feeling bad or failure about yourself  3 3 0 0 1  Trouble concentrating 0 0 0 0 0  Moving slowly or fidgety/restless 0 3 0 0 0  Suicidal thoughts 3 1 0 0 0  PHQ-9 Score 16 19 3 6 6    GAD 7 : Generalized Anxiety Score 07/14/2021 07/11/2021 06/16/2021 06/02/2021  Nervous, Anxious, on Edge 0 1 0 0  Control/stop worrying 3 3 1 1   Worry too much - different things 3 3 1 1   Trouble relaxing 3 3 0 1  Restless 0 3 0 0  Easily annoyed  or irritable 3 3 1 1   Afraid - awful might happen 3 3 1 1   Total GAD 7 Score 15 19 4  5

## 2021-07-28 ENCOUNTER — Other Ambulatory Visit: Payer: Self-pay

## 2021-07-28 ENCOUNTER — Inpatient Hospital Stay (HOSPITAL_BASED_OUTPATIENT_CLINIC_OR_DEPARTMENT_OTHER): Payer: BC Managed Care – PPO

## 2021-07-28 ENCOUNTER — Inpatient Hospital Stay (HOSPITAL_COMMUNITY)
Admission: AD | Admit: 2021-07-28 | Discharge: 2021-07-28 | Disposition: A | Discharge status: Home / Self Care | Payer: BC Managed Care – PPO | Attending: Family Medicine | Admitting: Family Medicine

## 2021-07-28 ENCOUNTER — Encounter (HOSPITAL_COMMUNITY): Payer: Self-pay | Admitting: Family Medicine

## 2021-07-28 DIAGNOSIS — O1003 Pre-existing essential hypertension complicating the puerperium: Secondary | ICD-10-CM | POA: Diagnosis not present

## 2021-07-28 DIAGNOSIS — M7989 Other specified soft tissue disorders: Secondary | ICD-10-CM | POA: Diagnosis not present

## 2021-07-28 DIAGNOSIS — Z7901 Long term (current) use of anticoagulants: Secondary | ICD-10-CM | POA: Insufficient documentation

## 2021-07-28 DIAGNOSIS — O1205 Gestational edema, complicating the puerperium: Secondary | ICD-10-CM | POA: Diagnosis not present

## 2021-07-28 DIAGNOSIS — Z87891 Personal history of nicotine dependence: Secondary | ICD-10-CM | POA: Insufficient documentation

## 2021-07-28 DIAGNOSIS — Z86718 Personal history of other venous thrombosis and embolism: Secondary | ICD-10-CM | POA: Diagnosis not present

## 2021-07-28 DIAGNOSIS — I10 Essential (primary) hypertension: Secondary | ICD-10-CM

## 2021-07-28 DIAGNOSIS — R600 Localized edema: Secondary | ICD-10-CM

## 2021-07-28 LAB — COMPREHENSIVE METABOLIC PANEL WITH GFR
ALT: 12 U/L (ref 0–44)
AST: 16 U/L (ref 15–41)
Albumin: 3.7 g/dL (ref 3.5–5.0)
Alkaline Phosphatase: 44 U/L (ref 38–126)
Anion gap: 7 (ref 5–15)
BUN: 6 mg/dL (ref 6–20)
CO2: 26 mmol/L (ref 22–32)
Calcium: 9.1 mg/dL (ref 8.9–10.3)
Chloride: 106 mmol/L (ref 98–111)
Creatinine, Ser: 0.83 mg/dL (ref 0.44–1.00)
GFR, Estimated: >60 mL/min
Glucose, Bld: 87 mg/dL (ref 70–99)
Potassium: 3.9 mmol/L (ref 3.5–5.1)
Sodium: 139 mmol/L (ref 135–145)
Total Bilirubin: 0.4 mg/dL (ref 0.3–1.2)
Total Protein: 7.1 g/dL (ref 6.5–8.1)

## 2021-07-28 LAB — CBC WITH DIFFERENTIAL/PLATELET
Abs Immature Granulocytes: 0.02 10*3/uL (ref 0.00–0.07)
Basophils Absolute: 0 10*3/uL (ref 0.0–0.1)
Basophils Relative: 0 %
Eosinophils Absolute: 0.1 10*3/uL (ref 0.0–0.5)
Eosinophils Relative: 2 %
HCT: 29.8 % — ABNORMAL LOW (ref 36.0–46.0)
Hemoglobin: 9.6 g/dL — ABNORMAL LOW (ref 12.0–15.0)
Immature Granulocytes: 0 %
Lymphocytes Relative: 33 %
Lymphs Abs: 2.4 10*3/uL (ref 0.7–4.0)
MCH: 29.7 pg (ref 26.0–34.0)
MCHC: 32.2 g/dL (ref 30.0–36.0)
MCV: 92.3 fL (ref 80.0–100.0)
Monocytes Absolute: 0.7 10*3/uL (ref 0.1–1.0)
Monocytes Relative: 9 %
Neutro Abs: 4.2 10*3/uL (ref 1.7–7.7)
Neutrophils Relative %: 56 %
Platelets: 338 10*3/uL (ref 150–400)
RBC: 3.23 MIL/uL — ABNORMAL LOW (ref 3.87–5.11)
RDW: 14.1 % (ref 11.5–15.5)
WBC: 7.5 10*3/uL (ref 4.0–10.5)
nRBC: 0 % (ref 0.0–0.2)

## 2021-07-28 MED ORDER — LABETALOL HCL 300 MG PO TABS
450.0000 mg | ORAL_TABLET | Freq: Two times a day (BID) | ORAL | 2 refills | Status: DC
Start: 2021-07-28 — End: 2023-11-23

## 2021-07-28 MED ORDER — FUROSEMIDE 20 MG PO TABS: 20.0000 mg | ORAL_TABLET | Freq: Every day | ORAL | 0 refills | Status: DC

## 2021-07-28 NOTE — MAU Note (Addendum)
VAS [place] being performed by Cardiovascular Ultrasonographer at bedside.

## 2021-07-28 NOTE — MAU Provider Note (Signed)
History     CSN: [id number]  Arrival date and time: 07/28/21 1251   Event Date/Time   First Provider Initiated Contact with Patient 07/28/21 1412      Chief Complaint  Patient presents with   Leg Swelling   HPI  Cassandra White is a 36 y.o.female status post vaginal delivery of 20 week fetus roughly 4 weeks ago. She reports right leg pain and swelling since delivery. She reports bilateral swelling, however the right leg has worsening swelling and pain.  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 While working she is on her feel a lot and does we leg stockings. She reports pain along with her swelling. The pain is constant. She reports leg stockings are helpful. She does desires another pregnancy, and plans to become pregnant in the future. She is currently taking labetalol 300 BID for her CHTN. She has a history of DVT and is currently on lovenox.   OB History     Gravida  1   Para  1   Term      Preterm  0   AB  0   Living  0      SAB  0   IAB      Ectopic      Multiple  0   Live Births  1           Past Medical History:  Diagnosis Date   Asthma    Breast disorder    lump in right breast   Chronic hypertension during pregnancy, antepartum 06/02/2021   Edema of left lower extremity    Since age 73    Fibrocystic breast changes    lump in right breast U/S 11/22/11: Multiple complicated cysts with mural nodularity as discussed above. Given the appearance, I recommend tissue sampling of the most complicated appearing lesion. If this demonstrates benign findings, recommend following the remaining lesions. Ultrasound- guided core biopsy was discussed with the patient. This will be scheduled per patient preference.  BI-RADS CATEGO   History of blood clots    right leg, left leg, right forearm   Obese    PCOS (polycystic ovarian syndrome)     Past Surgical History:  Procedure Laterality Date   BUNIONECTOMY     right foot   CERVICAL CERCLAGE  N/A 06/17/2021   Procedure: CERCLAGE CERVICAL;  Surgeon: Lazaro Arms, MD;  Location: [facility];  Service: Gynecology;  Laterality: N/A;   COLPOSCOPY  2009/2010    Family History  Problem Relation Age of Onset   Hypertension Mother    Asthma Mother    Pulmonary embolism Mother    Deep vein thrombosis Mother    Asthma Brother    Diabetes Father    Cancer Maternal Uncle        lung   Cancer Maternal Grandmother        brain   Cancer Maternal Grandfather        pancreatic   Diabetes Paternal Grandmother    Diabetes Paternal Grandfather     Social History   Tobacco Use   Smoking status: Former    Types: Cigarettes    Quit date: 01/04/2010    Years since quitting: 11.5   Smokeless tobacco: Current  Vaping Use   Vaping Use: Never used  Substance Use Topics   Alcohol use: Yes    Comment: socially   Drug use: No    Allergies:  Allergies  Allergen Reactions  Other     cucumbers   Avocado Nausea Only    Medications Prior to Admission  Medication Sig Dispense Refill Last Dose   enoxaparin (LOVENOX) 40 MG/0.4ML injection Inject 40 mg into the skin daily.   07/28/2021   ferrous sulfate (FERROUSUL) 325 (65 FE) MG tablet Take 1 tablet (325 mg total) by mouth every other day. 30 tablet 3 Past Week   labetalol (NORMODYNE) 300 MG tablet Take 1 tablet (300 mg total) by mouth 2 (two) times daily. 60 tablet 2 07/28/2021 at 0655   enoxaparin (LOVENOX) 40 MG/0.4ML injection Inject 0.4 mLs (40 mg total) into the skin every 12 (twelve) hours. (Patient not taking: Reported on 07/14/2021) 24 mL 6    ibuprofen (ADVIL) 600 MG tablet Take 1 tablet (600 mg total) by mouth every 6 (six) hours as needed for fever or headache. (Patient not taking: Reported on 07/14/2021) 30 tablet 0    Prenatal MV & Min w/FA-DHA (PRENATAL GUMMIES) 0.18-25 MG CHEW Chew by mouth. (Patient not taking: Reported on 07/14/2021)      Results for orders placed or performed during the hospital encounter of 07/28/21 (from the  past 48 hour(s))  Comprehensive metabolic panel     Status: None   Collection Time: 07/28/21  3:29 PM  Result Value Ref Range   Sodium 139 135 - 145 mmol/L   Potassium 3.9 3.5 - 5.1 mmol/L   Chloride 106 98 - 111 mmol/L   CO2 26 22 - 32 mmol/L   Glucose, Bld 87 70 - 99 mg/dL    Comment: Glucose reference range applies only to samples taken after fasting for at least 8 hours.   BUN 6 6 - 20 mg/dL   Creatinine, Ser 6.92 0.44 - 1.00 mg/dL   Calcium 9.1 8.9 - 23.0 mg/dL   Total Protein 7.1 6.5 - 8.1 g/dL   Albumin 3.7 3.5 - 5.0 g/dL   AST 16 15 - 41 U/L   ALT 12 0 - 44 U/L   Alkaline Phosphatase 44 38 - 126 U/L   Total Bilirubin 0.4 0.3 - 1.2 mg/dL   GFR, Estimated >09 >79 mL/min    Comment: (NOTE) Calculated using the CKD-EPI Creatinine Equation (2021)    Anion gap 7 5 - 15    Comment: Performed at [facility], [address], [place], [place] [address]  CBC with Differential/Platelet     Status: Abnormal   Collection Time: 07/28/21  3:29 PM  Result Value Ref Range   WBC 7.5 4.0 - 10.5 K/uL   RBC 3.23 (L) 3.87 - 5.11 MIL/uL   Hemoglobin 9.6 (L) 12.0 - 15.0 g/dL   HCT 82.0 (L) 99.0 - 68.9 %   MCV 92.3 80.0 - 100.0 fL   MCH 29.7 26.0 - 34.0 pg   MCHC 32.2 30.0 - 36.0 g/dL   RDW 34.0 68.4 - 03.3 %   Platelets 338 150 - 400 K/uL   nRBC 0.0 0.0 - 0.2 %   Neutrophils Relative % 56 %   Neutro Abs 4.2 1.7 - 7.7 K/uL   Lymphocytes Relative 33 %   Lymphs Abs 2.4 0.7 - 4.0 K/uL   Monocytes Relative 9 %   Monocytes Absolute 0.7 0.1 - 1.0 K/uL   Eosinophils Relative 2 %   Eosinophils Absolute 0.1 0.0 - 0.5 K/uL   Basophils Relative 0 %   Basophils Absolute 0.0 0.0 - 0.1 K/uL   Immature Granulocytes 0 %   Abs Immature Granulocytes 0.02  0.00 - 0.07 K/uL    Comment: Performed at [facility], [address] [place], [place] [address]    Review of Systems  Respiratory:  Negative for shortness of breath.   Cardiovascular:  Positive for leg swelling. Negative for chest  pain and palpitations.  Gastrointestinal:  Negative for abdominal pain.  Genitourinary:  Negative for vaginal bleeding.  Physical Exam   Blood pressure (!) 148/85, pulse 75, temperature 98.1 F (36.7 C), temperature source Oral, SpO2 99 %, not currently breastfeeding.  Physical Exam Constitutional:      General: She is not in acute distress.    Appearance: Normal appearance. She is obese. She is not ill-appearing, toxic-appearing or diaphoretic.  HENT:     Head: Normocephalic.  Pulmonary:     Effort: Pulmonary effort is normal.  Musculoskeletal:        General: Swelling (4+ bilateral pitting edema) and tenderness present. No deformity. Normal range of motion.     Right lower leg: Edema present.     Left lower leg: Edema present.  Skin:    General: Skin is warm.  Neurological:     Mental Status: She is alert and oriented to person, place, and time.  Psychiatric:        Mood and Affect: Mood normal.        Behavior: Behavior normal.    MAU Course  Procedures  MDM  Bilateral DVT study/US normal, without signs of DVT. Patient currently on labetalol 300 mg BID, BP only mildly controlled. With the risk of worsening edema with calcium channel blocker, and the desire for a pregnancy, will keep on labetalol, increase dose and start lasix. Reviewed this patient in detail with Dr. Crissie Reese.   Assessment and Plan   A:  1. Chronic hypertension   2. Lower leg edema   3. Postpartum state      P:  Discharge home Rx: Lasix Increase labetalol to 450 mg BID, patient has 300 mg tablets at home Continue lovenox Discussed the importance of regular GYN care and PCP establishment.   Duane Lope, NP 07/28/2021 5:01 PM

## 2021-07-28 NOTE — MAU Note (Signed)
Called phlebotomy to check on status of patients blood draw. Phlebotomist states she is on her way.

## 2021-07-28 NOTE — Progress Notes (Signed)
Bilateral lower extremity venous duplex has been completed. Preliminary results can be found in CV Proc through chart review.  Results were given to Venia Carbon NP.  07/28/21 3:03 PM Olen Cordial RVT

## 2021-07-28 NOTE — MAU Note (Addendum)
...  Cassandra White is a 36 y.o. here in MAU reporting: Right lower leg swelling that started in her right foot that has been occurring since she was discharged from the hospital post 20w Neonatal Demise on 06/29/2021. She states her swelling became worse around 07/03/2021 and has become more swollen since then. She states her OB is aware and has not performed any ultrasounds or "seemed to care." She is currently taking Lovenox injections BID and has two weeks left. Has a hx of five DVT's.  Pain score:  5/10 right lower leg

## 2021-08-10 ENCOUNTER — Telehealth: Payer: Self-pay | Admitting: Clinical

## 2021-08-10 ENCOUNTER — Ambulatory Visit: Payer: BC Managed Care – PPO | Admitting: Clinical

## 2021-08-10 DIAGNOSIS — F4321 Adjustment disorder with depressed mood: Secondary | ICD-10-CM

## 2021-08-10 DIAGNOSIS — F4323 Adjustment disorder with mixed anxiety and depressed mood: Secondary | ICD-10-CM

## 2021-08-10 NOTE — Telephone Encounter (Signed)
Intern attempted to reach the patient to reschedule an appointment with Asher Muir, but there was no answer. Left HIPPA-compliant message to call back Asher Muir from [employer] for [employer] at [facility] at  [phone] [facility] office).   De Hollingshead

## 2021-08-12 ENCOUNTER — Inpatient Hospital Stay (HOSPITAL_COMMUNITY)
Admission: AD | Admit: 2021-08-12 | Discharge: 2021-08-12 | Disposition: A | Discharge status: Home / Self Care | Payer: BC Managed Care – PPO | Attending: Obstetrics and Gynecology | Admitting: Obstetrics and Gynecology

## 2021-08-12 ENCOUNTER — Other Ambulatory Visit: Payer: Self-pay

## 2021-08-12 ENCOUNTER — Encounter (HOSPITAL_COMMUNITY): Payer: Self-pay | Admitting: Obstetrics and Gynecology

## 2021-08-12 DIAGNOSIS — N939 Abnormal uterine and vaginal bleeding, unspecified: Secondary | ICD-10-CM

## 2021-08-12 DIAGNOSIS — R1030 Lower abdominal pain, unspecified: Secondary | ICD-10-CM | POA: Diagnosis present

## 2021-08-12 LAB — HCG, QUANTITATIVE, PREGNANCY: hCG, Beta Chain, Quant, S: 9 m[IU]/mL — ABNORMAL HIGH (ref <5)

## 2021-08-12 LAB — POCT PREGNANCY, URINE: Preg Test, Ur: NEGATIVE

## 2021-08-12 NOTE — MAU Provider Note (Signed)
History     CSN: [id number]  Arrival date and time: 08/12/21 1703   Event Date/Time   First Provider Initiated Contact with Patient 08/12/21 1807      Chief Complaint  Patient presents with   Vaginal Bleeding   HPI Cassandra White is a 36 y.o female s/p vaginal delivery of a 20 week fetus on 06/30/2021 who presents to MAU for +upt, lower abdominal cramping, and brown spotting. Patient reports lower abdominal cramping and spotting started on Tuesday, but today the discharge became a dark brown color. No currently having pain. She reports that after her delivery her bleeding stopped on 1/31, however reports she has had ongoing intermittent bleeding/spotting. She denies urinary s/s, N/V, diarrhea, fever, or chills. She reports she has had unprotected intercourse since her delivery. Medical history includes cHTN on which she is on 450mg  labetalol BID. She has an appointment scheduled on Tuesday at [facility].   OB History     Gravida  1   Para  1   Term  0   Preterm  0   AB  0   Living  0      SAB  0   IAB  0   Ectopic  0   Multiple  0   Live Births  0           Past Medical History:  Diagnosis Date   Asthma    Breast disorder    lump in right breast   Chronic hypertension during pregnancy, antepartum 06/02/2021   Edema of left lower extremity    Since age 20    Fibrocystic breast changes    lump in right breast U/S 11/22/11: Multiple complicated cysts with mural nodularity as discussed above. Given the appearance, I recommend tissue sampling of the most complicated appearing lesion. If this demonstrates benign findings, recommend following the remaining lesions. Ultrasound- guided core biopsy was discussed with the patient. This will be scheduled per patient preference.  BI-RADS CATEGO   History of blood clots    right leg, left leg, right forearm   Obese    PCOS (polycystic ovarian syndrome)     Past Surgical History:  Procedure Laterality Date   BUNIONECTOMY      right foot   CERVICAL CERCLAGE N/A 06/17/2021   Procedure: CERCLAGE CERVICAL;  Surgeon: 06/19/2021, MD;  Location: [facility] LD ORS;  Service: Gynecology;  Laterality: N/A;   COLPOSCOPY  2009/2010    Family History  Problem Relation Age of Onset   Hypertension Mother    Asthma Mother    Pulmonary embolism Mother    Deep vein thrombosis Mother    Asthma Brother    Diabetes Father    Cancer Maternal Uncle        lung   Cancer Maternal Grandmother        brain   Cancer Maternal Grandfather        pancreatic   Diabetes Paternal Grandmother    Diabetes Paternal Grandfather     Social History   Tobacco Use   Smoking status: Former    Types: Cigarettes    Quit date: 01/04/2010    Years since quitting: 11.6   Smokeless tobacco: Current  Vaping Use   Vaping Use: Never used  Substance Use Topics   Alcohol use: Yes    Comment: socially   Drug use: No    Allergies:  Allergies  Allergen Reactions   Other     cucumbers   Avocado Nausea  Only    No medications prior to admission.    Review of Systems  Constitutional: Negative.   Respiratory: Negative.    Cardiovascular: Negative.   Gastrointestinal:  Positive for abdominal pain (cramping).  Genitourinary:  Positive for vaginal discharge (brown spotting). Negative for dysuria, frequency and urgency.  Musculoskeletal: Negative.   Neurological: Negative.   Psychiatric/Behavioral: Negative.     Physical Exam   Blood pressure (!) 172/94, pulse 87, temperature 98 F (36.7 C), resp. rate 18.  Physical Exam Vitals and nursing note reviewed.  Constitutional:      General: She is not in acute distress. Eyes:     Extraocular Movements: Extraocular movements intact.     Pupils: Pupils are equal, round, and reactive to light.  Cardiovascular:     Rate and Rhythm: Normal rate.  Pulmonary:     Effort: Pulmonary effort is normal.  Abdominal:     Tenderness: There is no abdominal tenderness.  Musculoskeletal:         General: Normal range of motion.  Skin:    General: Skin is warm and dry.  Neurological:     General: No focal deficit present.     Mental Status: She is alert and oriented to person, place, and time.  Psychiatric:        Mood and Affect: Mood normal.        Behavior: Behavior normal.        Thought Content: Thought content normal.        Judgment: Judgment normal.    MAU Course  Procedures UPT HCG  MDM UPT negative HCG 9 While waiting for results, reports she had to leave as her child had an accident at home.  D/w patient importance of following hcg level as unsure if this level is related to delivery in January or if this is related to a new pregnancy Patient left prior to repeat BP check  Assessment and Plan  Vaginal spotting  - Discharge home in stable condition [facility], CNM notified and will schedule patient for f/u on Monday in office - Patient given strict bleeding and return precautions. Return to MAU sooner or as needed for worsening symptoms    Brand Males, CNM 08/12/2021, 7:22 PM

## 2021-08-12 NOTE — MAU Note (Incomplete)
Pt stated she had a faint positive HPT last week. Has has several more positive test since. Pt reports she started having some spotting and lower abd cramping on and off since Tuesday. Spotting is more brown in color today.

## 2022-03-22 ENCOUNTER — Encounter (HOSPITAL_COMMUNITY): Payer: Self-pay

## 2022-03-22 ENCOUNTER — Emergency Department (HOSPITAL_COMMUNITY): Payer: Commercial Managed Care - HMO

## 2022-03-22 ENCOUNTER — Other Ambulatory Visit: Payer: Self-pay

## 2022-03-22 ENCOUNTER — Emergency Department (HOSPITAL_COMMUNITY)
Admission: EM | Admit: 2022-03-22 | Discharge: 2022-03-22 | Disposition: A | Discharge status: Home / Self Care | Payer: Commercial Managed Care - HMO | Attending: Emergency Medicine | Admitting: Emergency Medicine

## 2022-03-22 DIAGNOSIS — R1011 Right upper quadrant pain: Secondary | ICD-10-CM | POA: Diagnosis present

## 2022-03-22 DIAGNOSIS — Z853 Personal history of malignant neoplasm of breast: Secondary | ICD-10-CM | POA: Diagnosis not present

## 2022-03-22 DIAGNOSIS — N9489 Other specified conditions associated with female genital organs and menstrual cycle: Secondary | ICD-10-CM | POA: Diagnosis not present

## 2022-03-22 DIAGNOSIS — J45909 Unspecified asthma, uncomplicated: Secondary | ICD-10-CM | POA: Diagnosis not present

## 2022-03-22 DIAGNOSIS — K8050 Calculus of bile duct without cholangitis or cholecystitis without obstruction: Secondary | ICD-10-CM | POA: Insufficient documentation

## 2022-03-22 DIAGNOSIS — Z87891 Personal history of nicotine dependence: Secondary | ICD-10-CM | POA: Insufficient documentation

## 2022-03-22 LAB — URINALYSIS, ROUTINE W REFLEX MICROSCOPIC
Bilirubin Urine: NEGATIVE
Glucose, UA: NEGATIVE mg/dL
Hgb urine dipstick: NEGATIVE
Ketones, ur: NEGATIVE mg/dL
Nitrite: NEGATIVE
Protein, ur: NEGATIVE mg/dL
Specific Gravity, Urine: 1.016 (ref 1.005–1.030)
pH: 7 (ref 5.0–8.0)

## 2022-03-22 LAB — COMPREHENSIVE METABOLIC PANEL
ALT: 9 U/L (ref 0–44)
AST: 16 U/L (ref 15–41)
Albumin: 3.6 g/dL (ref 3.5–5.0)
Alkaline Phosphatase: 42 U/L (ref 38–126)
Anion gap: 4 — ABNORMAL LOW (ref 5–15)
BUN: 14 mg/dL (ref 6–20)
CO2: 25 mmol/L (ref 22–32)
Calcium: 8.7 mg/dL — ABNORMAL LOW (ref 8.9–10.3)
Chloride: 108 mmol/L (ref 98–111)
Creatinine, Ser: 0.95 mg/dL (ref 0.44–1.00)
GFR, Estimated: >60 mL/min (ref >60)
Glucose, Bld: 110 mg/dL — ABNORMAL HIGH (ref 70–99)
Potassium: 3.8 mmol/L (ref 3.5–5.1)
Sodium: 137 mmol/L (ref 135–145)
Total Bilirubin: 0.5 mg/dL (ref 0.3–1.2)
Total Protein: 7.5 g/dL (ref 6.5–8.1)

## 2022-03-22 LAB — CBC
HCT: 33.3 % — ABNORMAL LOW (ref 36.0–46.0)
Hemoglobin: 10.7 g/dL — ABNORMAL LOW (ref 12.0–15.0)
MCH: 30.1 pg (ref 26.0–34.0)
MCHC: 32.1 g/dL (ref 30.0–36.0)
MCV: 93.8 fL (ref 80.0–100.0)
Platelets: 340 10*3/uL (ref 150–400)
RBC: 3.55 MIL/uL — ABNORMAL LOW (ref 3.87–5.11)
RDW: 13.2 % (ref 11.5–15.5)
WBC: 7.4 10*3/uL (ref 4.0–10.5)
nRBC: 0 % (ref 0.0–0.2)

## 2022-03-22 LAB — LIPASE, BLOOD: Lipase: 33 U/L (ref 11–51)

## 2022-03-22 LAB — I-STAT BETA HCG BLOOD, ED (MC, WL, AP ONLY): I-stat hCG, quantitative: <5 m[IU]/mL (ref <5)

## 2022-03-22 MED ORDER — KETOROLAC TROMETHAMINE 15 MG/ML IJ SOLN
15.0000 mg | Freq: Once | INTRAMUSCULAR | Status: AC
  Administered 2022-03-22: 15 mg via INTRAVENOUS
  Filled 2022-03-22: qty 1

## 2022-03-22 MED ORDER — NAPROXEN 500 MG PO TABS: 500.0000 mg | ORAL_TABLET | Freq: Two times a day (BID) | ORAL | 0 refills | Status: DC

## 2022-03-22 MED ORDER — FENTANYL CITRATE PF 50 MCG/ML IJ SOSY
50.0000 ug | PREFILLED_SYRINGE | Freq: Once | INTRAMUSCULAR | Status: AC
  Administered 2022-03-22: 50 ug via INTRAVENOUS
  Filled 2022-03-22: qty 1

## 2022-03-22 MED ORDER — ONDANSETRON HCL 4 MG/2ML IJ SOLN
4.0000 mg | Freq: Once | INTRAMUSCULAR | Status: AC
  Administered 2022-03-22: 4 mg via INTRAVENOUS
  Filled 2022-03-22: qty 2

## 2022-03-22 MED ORDER — SODIUM CHLORIDE 0.9 % IV BOLUS
1000.0000 mL | Freq: Once | INTRAVENOUS | Status: AC
  Administered 2022-03-22: 1000 mL via INTRAVENOUS

## 2022-03-22 NOTE — ED Provider Notes (Signed)
[facility]-EMERGENCY DEPT [facility] Emergency Department Provider Note MRN:  [id number]  Arrival date & time: 03/22/22     Chief Complaint   Abdominal Pain   History of Present Illness   Cassandra White is a 36 y.o. year-old female with no pertinent past medical history presenting to the ED with chief complaint of abdominal pain.  Abdominal pain right upper quadrant abdominal pain, radiation to the back, specifically the right shoulder blade.  Moderate to severe.  Some nausea.  No vomiting, no lower abdominal pain, no vaginal bleeding or discharge, no diarrhea.  Review of Systems  A thorough review of systems was obtained and all systems are negative except as noted in the HPI and PMH.   Patient's Health History    Past Medical History:  Diagnosis Date   Asthma    Breast disorder    lump in right breast   Chronic hypertension during pregnancy, antepartum 06/02/2021   Edema of left lower extremity    Since age 83    Fibrocystic breast changes    lump in right breast U/S 11/22/11: Multiple complicated cysts with mural nodularity as discussed above. Given the appearance, I recommend tissue sampling of the most complicated appearing lesion. If this demonstrates benign findings, recommend following the remaining lesions. Ultrasound- guided core biopsy was discussed with the patient. This will be scheduled per patient preference.  BI-RADS CATEGO   History of blood clots    right leg, left leg, right forearm   Obese    PCOS (polycystic ovarian syndrome)     Past Surgical History:  Procedure Laterality Date   BUNIONECTOMY     right foot   CERVICAL CERCLAGE N/A 06/17/2021   Procedure: CERCLAGE CERVICAL;  Surgeon: Lazaro Arms, MD;  Location: [facility] LD ORS;  Service: Gynecology;  Laterality: N/A;   COLPOSCOPY  2009/2010    Family History  Problem Relation Age of Onset   Hypertension Mother    Asthma Mother    Pulmonary embolism Mother    Deep vein thrombosis Mother    Asthma  Brother    Diabetes Father    Cancer Maternal Uncle        lung   Cancer Maternal Grandmother        brain   Cancer Maternal Grandfather        pancreatic   Diabetes Paternal Grandmother    Diabetes Paternal Grandfather     Social History   Socioeconomic History   Marital status: Single    Spouse name: Not on file   Number of children: Not on file   Years of education: in school    Highest education level: Not on file  Occupational History   Occupation: [employer]     Employer: [employer]  Tobacco Use   Smoking status: Former    Types: Cigarettes    Quit date: 01/04/2010    Years since quitting: 12.2   Smokeless tobacco: Current  Vaping Use   Vaping Use: Never used  Substance and Sexual Activity   Alcohol use: Yes    Comment: socially   Drug use: No   Sexual activity: Yes    Birth control/protection: None  Other Topics Concern   Not on file  Social History Narrative   ** Merged History Encounter **       Sexually active with women    Social Determinants of Health   Financial Resource Strain: Not on file  Food Insecurity: Food Insecurity Present (06/02/2021)   Hunger Vital Sign  Worried About Charity fundraiser in the Last Year: Sometimes true    [employer] of Food in the Last Year: Sometimes true  Transportation Needs: No Transportation Needs (06/02/2021)   PRAPARE - Hydrologist (Medical): No    Lack of Transportation (Non-Medical): No  Physical Activity: Not on file  Stress: Not on file  Social Connections: Not on file  Intimate Partner Violence: Not on file     Physical Exam   Vitals:   03/22/22 0424 03/22/22 0515  BP: (!) 151/100 131/88  Pulse: 79 73  Resp: 18 17  Temp: 98 F (36.7 C)   SpO2: 100% 98%    CONSTITUTIONAL: Well-appearing, NAD NEURO/PSYCH:  Alert and oriented x 3, no focal deficits EYES:  eyes equal and reactive ENT/NECK:  no LAD, no JVD CARDIO: Regular rate, well-perfused, normal S1 and S2 PULM:  CTAB  no wheezing or rhonchi GI/GU:  non-distended, non-tender MSK/SPINE:  No gross deformities, no edema SKIN:  no rash, atraumatic   *Additional and/or pertinent findings included in MDM below  Diagnostic and Interventional Summary    EKG Interpretation  Date/Time:    Ventricular Rate:    PR Interval:    QRS Duration:   QT Interval:    QTC Calculation:   R Axis:     Text Interpretation:         Labs Reviewed  CBC - Abnormal; Notable for the following components:      Result Value   RBC 3.55 (*)    Hemoglobin 10.7 (*)    HCT 33.3 (*)    All other components within normal limits  COMPREHENSIVE METABOLIC PANEL - Abnormal; Notable for the following components:   Glucose, Bld 110 (*)    Calcium 8.7 (*)    Anion gap 4 (*)    All other components within normal limits  URINALYSIS, ROUTINE W REFLEX MICROSCOPIC - Abnormal; Notable for the following components:   APPearance HAZY (*)    Leukocytes,Ua TRACE (*)    Bacteria, UA RARE (*)    All other components within normal limits  LIPASE, BLOOD  I-STAT BETA HCG BLOOD, ED ([facility], [facility], [facility] ONLY)    US Abdomen Limited RUQ (LIVER/GB)  Final Result      Medications  ketorolac (TORADOL) 15 MG/ML injection 15 mg (15 mg Intravenous Given 03/22/22 0526)  fentaNYL (SUBLIMAZE) injection 50 mcg (50 mcg Intravenous Given 03/22/22 0528)  ondansetron (ZOFRAN) injection 4 mg (4 mg Intravenous Given 03/22/22 0552)  sodium chloride 0.9 % bolus 1,000 mL (1,000 mLs Intravenous New Bag/Given 03/22/22 0529)     Procedures  /  Critical Care Procedures  ED Course and Medical Decision Making  Initial Impression and Ddx Suspicious for biliary colic or possibly cholecystitis.  Awaiting labs, ultrasound.  Providing pain management.  Past medical/surgical history that increases complexity of ED encounter: Obesity  Interpretation of Diagnostics I personally reviewed the laboratory assessment and my interpretation is as follows: No significant blood count or  electrolyte disturbance  Ultrasound revealing cholelithiasis but without cholecystitis.  Patient Reassessment and Ultimate Disposition/Management     Patient is doing much better, pain largely resolved.  Abdomen is soft and nontender and so CT imaging is felt to be of low yield, largely not indicated at this time.  History and physical seem consistent with biliary colic.  Strict return precautions for worsening abdominal pain or fever, will refer to general surgery.  Patient management required discussion with the following services  or consulting groups:  None  Complexity of Problems Addressed Acute illness or injury that poses threat of life of bodily function  Additional Data Reviewed and Analyzed Further history obtained from: None  Additional Factors Impacting ED Encounter Risk Use of parenteral controlled substances  Elmer Sow. Pilar Plate, MD [facility] Emergency Medicine [facility] [email or web address]  Final Clinical Impressions(s) / ED Diagnoses     ICD-10-CM   1. Biliary colic  K80.50       ED Discharge Orders          Ordered    naproxen (NAPROSYN) 500 MG tablet  2 times daily        03/22/22 0654             Discharge Instructions Discussed with and Provided to Patient:     Discharge Instructions      You were evaluated in the Emergency Department and after careful evaluation, we did not find any emergent condition requiring admission or further testing in the hospital.  Your exam/testing today was overall reassuring.  Symptoms likely due to stones in the gallbladder.  Recommend decreasing the amount of fat in your diet, recommend follow-up with the general surgeons.  Take the Naprosyn anti-inflammatory for pain.  Please return to the Emergency Department if you experience any worsening of your condition.  Thank you for allowing [place] to be a part of your care.        Sabas Sous, MD 03/22/22 [phone]

## 2022-03-22 NOTE — Discharge Instructions (Addendum)
You were evaluated in the Emergency Department and after careful evaluation, we did not find any emergent condition requiring admission or further testing in the hospital.  Your exam/testing today was overall reassuring.  Symptoms likely due to stones in the gallbladder.  Recommend decreasing the amount of fat in your diet, recommend follow-up with the general surgeons.  Take the Naprosyn anti-inflammatory for pain.  Please return to the Emergency Department if you experience any worsening of your condition.  Thank you for allowing [place] to be a part of your care.

## 2022-03-22 NOTE — ED Triage Notes (Signed)
Pt. BIB [facility] for abd. Pain x3 hrs. Pain starts in the R upper quadrant and radiates to the low back. VSS with EMS.

## 2022-03-24 ENCOUNTER — Ambulatory Visit: Payer: Self-pay | Admitting: Surgery

## 2022-03-24 NOTE — H&P (Signed)
Subjective    Chief Complaint: Cholelithiasis       History of Present Illness: Cassandra White is a 36 y.o. female who is seen today as an office consultation at the request of  Self for evaluation of Cholelithiasis .     This is a 36 year old female with no significant past medical history who presented to the emergency department on 03/22/2022 with acute onset of right upper quadrant abdominal pain.  This was associated with radiation of the pain to her back specifically the right shoulder.  She did describe some nausea but no vomiting.  No diarrhea.  She was evaluated in the emergency department.  White count and liver function tests were normal.  Ultrasound showed multiple gallstones but no sign of acute cholecystitis.  Her symptoms were resolved so the patient was discharged with instructions to follow-up for elective cholecystectomy.   She has been adhering to a strict low-fat diet.  She still has mild symptoms that are controllable with Tylenol.     Review of Systems: A complete review of systems was obtained from the patient.  I have reviewed this information and discussed as appropriate with the patient.  See HPI as well for other ROS.   Review of Systems  Constitutional: Negative.   HENT: Negative.    Eyes: Negative.   Respiratory: Negative.    Cardiovascular: Negative.   Gastrointestinal:  Positive for abdominal pain and nausea.  Genitourinary: Negative.   Musculoskeletal:  Positive for back pain.  Skin: Negative.   Neurological: Negative.   Endo/Heme/Allergies: Negative.   Psychiatric/Behavioral: Negative.         Medical History: Past Medical History      Past Medical History:  Diagnosis Date   Anemia     DVT (deep venous thrombosis) (CMS-HCC)             Patient Active Problem List  Diagnosis   Calculus of gallbladder with chronic cholecystitis without obstruction   Anemia   Asthma   Deep vein thrombosis (DVT) of right radial vein (CMS-HCC)   Edema    History of DVT (deep vein thrombosis)   History of gastric ulcer   Maternal care for cervical incompetence, unspecified trimester   Morbid obesity (CMS-HCC)   PCOS (polycystic ovarian syndrome)      Past Surgical History       Past Surgical History:  Procedure Laterality Date   bunionectomy       DILATION AND CURETTAGE, DIAGNOSTIC / THERAPEUTIC            Allergies      Allergies  Allergen Reactions   Latex Anaphylaxis, Hives and Swelling   Avocado Nausea              Current Outpatient Medications on File Prior to Visit  Medication Sig Dispense Refill   ferrous sulfate 325 (65 FE) MG tablet Take 325 mg by mouth every other day       naproxen (NAPROSYN) 500 MG tablet Take 500 mg by mouth 2 (two) times daily        No current facility-administered medications on file prior to visit.      Family History       Family History  Problem Relation Age of Onset   Obesity Mother     Deep vein thrombosis (DVT or abnormal blood clot formation) Mother     Obesity Father     Diabetes Father          Social History  Tobacco Use  Smoking Status Never  Smokeless Tobacco Never      Social History  Social History        Socioeconomic History   Marital status: Single  Tobacco Use   Smoking status: Never   Smokeless tobacco: Never  Vaping Use   Vaping Use: Never used  Substance and Sexual Activity   Alcohol use: Never   Drug use: Never        Objective:         Vitals:    03/24/22 1007  BP: 122/68  Pulse: 96  Temp: 36.6 C (97.8 F)  SpO2: 96%  Weight: (!) 168.6 kg (371 lb 9.6 oz)  Height: 172.7 cm (5\' 8" )    Body mass index is 56.5 kg/m.   Physical Exam    Constitutional:  WDWN in NAD, conversant, no obvious deformities; lying in bed comfortably Eyes:  Pupils equal, round; sclera anicteric; moist conjunctiva; no lid lag HENT:  Oral mucosa moist; good dentition  Neck:  No masses palpated, trachea midline; no thyromegaly Lungs:  CTA  bilaterally; normal respiratory effort CV:  Regular rate and rhythm; no murmurs; extremities well-perfused with no edema Abd:  +bowel sounds, obese, soft, mild RUQ tenderness;  no palpable organomegaly; no palpable hernias Musc:  Normal gait; no apparent clubbing or cyanosis in extremities Lymphatic:  No palpable cervical or axillary lymphadenopathy Skin:  Warm, dry; no sign of jaundice Psychiatric - alert and oriented x 4; calm mood and affect     Labs, Imaging and Diagnostic Testing: LFT's WNL 03/22/22 WBC/ Lipase WNL   CLINICAL DATA:  [id number] with right upper quadrant acute pain.   EXAM: ULTRASOUND ABDOMEN LIMITED RIGHT UPPER QUADRANT   COMPARISON:  CT abdomen and pelvis with IV contrast 01/12/2011.   FINDINGS: Gallbladder:   There are layering stones in the gallbladder measuring up to 1.5 cm there is no wall thickening, pericholecystic fluid or positive sonographic Murphy's sign.   Common bile duct:   Diameter: 2.4 mm with no appreciable intrahepatic biliary prominence.   Liver:   No focal lesion identified. There is increased hepatic echogenicity consistent with at least a mild degree of steatosis, which was seen in 2012 as well. Portal vein is patent on color Doppler imaging with normal direction of blood flow towards the liver.   Other: None.   IMPRESSION: Cholelithiasis, without sonographic findings of acute cholecystitis at this time.     Electronically Signed   By: Telford Nab M.D.   On: 03/22/2022 05:32   Assessment and Plan:  Diagnoses and all orders for this visit:   Calculus of gallbladder with chronic cholecystitis without obstruction       Plan laparoscopic cholecystectomy with intraoperative cholangiogram.The surgical procedure has been discussed with the patient.  Potential risks, benefits, alternative treatments, and expected outcomes have been explained.  All of the patient's questions at this time have been answered.  The likelihood of  reaching the patient's treatment goal is good.  The patient understand the proposed surgical procedure and wishes to proceed.     No follow-ups on file.   Aveon Colquhoun Jearld Adjutant, MD  03/24/2022 10:28 AM

## 2022-04-11 ENCOUNTER — Other Ambulatory Visit (HOSPITAL_COMMUNITY): Payer: BC Managed Care – PPO

## 2022-04-14 ENCOUNTER — Encounter: Payer: Self-pay | Admitting: Nurse Practitioner

## 2022-04-14 ENCOUNTER — Ambulatory Visit (INDEPENDENT_AMBULATORY_CARE_PROVIDER_SITE_OTHER): Payer: Commercial Managed Care - HMO | Admitting: Nurse Practitioner

## 2022-04-14 VITALS — BP 138/98 | HR 70 | Temp 98.2°F | Ht 68.0 in | Wt 371.0 lb

## 2022-04-14 DIAGNOSIS — Z6841 Body Mass Index (BMI) 40.0 and over, adult: Secondary | ICD-10-CM

## 2022-04-14 DIAGNOSIS — Z23 Encounter for immunization: Secondary | ICD-10-CM | POA: Diagnosis not present

## 2022-04-14 DIAGNOSIS — R0681 Apnea, not elsewhere classified: Secondary | ICD-10-CM | POA: Diagnosis not present

## 2022-04-14 MED ORDER — OZEMPIC (0.25 OR 0.5 MG/DOSE) 2 MG/3ML ~~LOC~~ SOPN: 0.2500 mg | PEN_INJECTOR | SUBCUTANEOUS | 1 refills | Status: DC

## 2022-04-19 ENCOUNTER — Ambulatory Visit (HOSPITAL_COMMUNITY): Admission: RE | Admit: 2022-04-19 | Payer: Commercial Managed Care - HMO | Source: Ambulatory Visit | Admitting: Surgery

## 2022-04-19 ENCOUNTER — Encounter (HOSPITAL_COMMUNITY): Admission: RE | Payer: Self-pay | Source: Ambulatory Visit

## 2022-04-19 SURGERY — LAPAROSCOPIC CHOLECYSTECTOMY WITH INTRAOPERATIVE CHOLANGIOGRAM
Anesthesia: General

## 2022-05-10 ENCOUNTER — Institutional Professional Consult (permissible substitution): Payer: Commercial Managed Care - HMO | Admitting: Primary Care

## 2022-05-17 ENCOUNTER — Ambulatory Visit: Payer: Self-pay | Admitting: Nurse Practitioner

## 2022-05-23 ENCOUNTER — Encounter (HOSPITAL_COMMUNITY): Payer: Self-pay

## 2022-09-15 ENCOUNTER — Emergency Department (HOSPITAL_COMMUNITY)
Admission: EM | Admit: 2022-09-15 | Discharge: 2022-09-15 | Disposition: A | Discharge status: Home / Self Care | Payer: Commercial Managed Care - HMO | Attending: Emergency Medicine | Admitting: Emergency Medicine

## 2022-09-15 ENCOUNTER — Encounter (HOSPITAL_COMMUNITY): Payer: Self-pay

## 2022-09-15 ENCOUNTER — Emergency Department (HOSPITAL_COMMUNITY): Payer: Commercial Managed Care - HMO

## 2022-09-15 DIAGNOSIS — Z79899 Other long term (current) drug therapy: Secondary | ICD-10-CM | POA: Insufficient documentation

## 2022-09-15 DIAGNOSIS — Z91040 Latex allergy status: Secondary | ICD-10-CM | POA: Diagnosis not present

## 2022-09-15 DIAGNOSIS — H538 Other visual disturbances: Secondary | ICD-10-CM | POA: Insufficient documentation

## 2022-09-15 DIAGNOSIS — I10 Essential (primary) hypertension: Secondary | ICD-10-CM | POA: Insufficient documentation

## 2022-09-15 DIAGNOSIS — R600 Localized edema: Secondary | ICD-10-CM | POA: Insufficient documentation

## 2022-09-15 DIAGNOSIS — J45909 Unspecified asthma, uncomplicated: Secondary | ICD-10-CM | POA: Diagnosis not present

## 2022-09-15 LAB — I-STAT BETA HCG BLOOD, ED (MC, WL, AP ONLY): I-stat hCG, quantitative: <5 m[IU]/mL (ref <5)

## 2022-09-15 LAB — TROPONIN I (HIGH SENSITIVITY): Troponin I (High Sensitivity): 2 ng/L (ref <18)

## 2022-09-15 LAB — COMPREHENSIVE METABOLIC PANEL
ALT: 10 U/L (ref 0–44)
AST: 15 U/L (ref 15–41)
Albumin: 3.8 g/dL (ref 3.5–5.0)
Alkaline Phosphatase: 39 U/L (ref 38–126)
Anion gap: 7 (ref 5–15)
BUN: 10 mg/dL (ref 6–20)
CO2: 25 mmol/L (ref 22–32)
Calcium: 8.7 mg/dL — ABNORMAL LOW (ref 8.9–10.3)
Chloride: 106 mmol/L (ref 98–111)
Creatinine, Ser: 0.9 mg/dL (ref 0.44–1.00)
GFR, Estimated: >60 mL/min (ref >60)
Glucose, Bld: 107 mg/dL — ABNORMAL HIGH (ref 70–99)
Potassium: 3.4 mmol/L — ABNORMAL LOW (ref 3.5–5.1)
Sodium: 138 mmol/L (ref 135–145)
Total Bilirubin: 0.5 mg/dL (ref 0.3–1.2)
Total Protein: 7.5 g/dL (ref 6.5–8.1)

## 2022-09-15 LAB — CBC WITH DIFFERENTIAL/PLATELET
Abs Immature Granulocytes: 0 10*3/uL (ref 0.00–0.07)
Basophils Absolute: 0 10*3/uL (ref 0.0–0.1)
Basophils Relative: 0 %
Eosinophils Absolute: 0.1 10*3/uL (ref 0.0–0.5)
Eosinophils Relative: 2 %
HCT: 33.1 % — ABNORMAL LOW (ref 36.0–46.0)
Hemoglobin: 10.8 g/dL — ABNORMAL LOW (ref 12.0–15.0)
Immature Granulocytes: 0 %
Lymphocytes Relative: 29 %
Lymphs Abs: 2 10*3/uL (ref 0.7–4.0)
MCH: 30.6 pg (ref 26.0–34.0)
MCHC: 32.6 g/dL (ref 30.0–36.0)
MCV: 93.8 fL (ref 80.0–100.0)
Monocytes Absolute: 0.6 10*3/uL (ref 0.1–1.0)
Monocytes Relative: 8 %
Neutro Abs: 4.2 10*3/uL (ref 1.7–7.7)
Neutrophils Relative %: 61 %
Platelets: 345 10*3/uL (ref 150–400)
RBC: 3.53 MIL/uL — ABNORMAL LOW (ref 3.87–5.11)
RDW: 12.9 % (ref 11.5–15.5)
WBC: 7 10*3/uL (ref 4.0–10.5)
nRBC: 0 % (ref 0.0–0.2)

## 2022-09-15 LAB — URINALYSIS, ROUTINE W REFLEX MICROSCOPIC
Bacteria, UA: NONE SEEN
Bilirubin Urine: NEGATIVE
Glucose, UA: NEGATIVE mg/dL
Hgb urine dipstick: NEGATIVE
Ketones, ur: NEGATIVE mg/dL
Leukocytes,Ua: NEGATIVE
Nitrite: POSITIVE — AB
Protein, ur: NEGATIVE mg/dL
Specific Gravity, Urine: 1.021 (ref 1.005–1.030)
pH: 6 (ref 5.0–8.0)

## 2022-09-15 MED ORDER — DIPHENHYDRAMINE HCL 50 MG/ML IJ SOLN
12.5000 mg | Freq: Once | INTRAMUSCULAR | Status: AC
  Administered 2022-09-15: 12.5 mg via INTRAVENOUS
  Filled 2022-09-15: qty 1

## 2022-09-15 MED ORDER — GADOBUTROL 1 MMOL/ML IV SOLN
10.0000 mL | Freq: Once | INTRAVENOUS | Status: AC | PRN
  Administered 2022-09-15: 10 mL via INTRAVENOUS

## 2022-09-15 MED ORDER — ONDANSETRON HCL 4 MG/2ML IJ SOLN
4.0000 mg | Freq: Once | INTRAMUSCULAR | Status: AC
  Administered 2022-09-15: 4 mg via INTRAVENOUS
  Filled 2022-09-15: qty 2

## 2022-09-15 MED ORDER — KETOROLAC TROMETHAMINE 15 MG/ML IJ SOLN
15.0000 mg | Freq: Once | INTRAMUSCULAR | Status: AC
  Administered 2022-09-15: 15 mg via INTRAVENOUS
  Filled 2022-09-15: qty 1

## 2022-09-15 MED ORDER — IOHEXOL 350 MG/ML SOLN
80.0000 mL | Freq: Once | INTRAVENOUS | Status: AC | PRN
  Administered 2022-09-15: 80 mL via INTRAVENOUS

## 2022-09-15 NOTE — ED Provider Notes (Signed)
[facility] EMERGENCY DEPARTMENT AT [facility] Provider Note   CSN: [id number] Arrival date & time: 09/15/22  1446     History  Chief Complaint  Patient presents with   Hypertension   Headache    Cassandra White is a 37 y.o. female. With past medical history of obesity, PCOS, asthma, DVT who presents to the emergency department with hypertension.  States she is having high blood pressure and headache. States she woke up this morning with headache. She took her BP which was around 150/90. She went to work but continued to have headache. Took motrin which improved symptoms briefly but then headache returned and worsened. States at work they took her blood pressure and it was 170s/110 so they instructed her to come here. Continues to have 8/10 frontal headache that is throbbing. She is having blurry vision only in her right eye. She denies numbness or tingling or weakness. Denies confusion. Denies chest pain, shortness of breath, palpitations, hematuria.  She has history of hypertension in pregnancy. She was started on labetalol during her pregnancy in November of 2022. She states she lost the pregnancy in January and has been continued on the same medication. Has followed up once with a PCP who did not change medications around at that time. She was originally prescribed Labetalol 300 BID. She states she was then told to half it and takes 300 daily now. She does endorse some mild LE fluid build up which is typical if her BP is elevated. She was not told at the time she had evidence of heart failure in pregnancy.    Hypertension Associated symptoms include headaches.  Headache Associated symptoms: no photophobia        Home Medications Prior to Admission medications   Medication Sig Start Date End Date Taking? Authorizing Provider  ferrous sulfate (FERROUSUL) 325 (65 FE) MG tablet Take 1 tablet (325 mg total) by mouth every other day. 07/14/21   Griffin Basil, MD  ferrous  sulfate 325 (65 FE) MG tablet Take 325 mg by mouth daily with breakfast.    [provider]  labetalol (NORMODYNE) 300 MG tablet Take 1.5 tablets (450 mg total) by mouth 2 (two) times daily. 07/28/21   Rasch, Anderson Malta I, NP  naproxen (NAPROSYN) 500 MG tablet Take 1 tablet (500 mg total) by mouth 2 (two) times daily. 03/22/22   Maudie Flakes, MD  oxyCODONE (OXY IR/ROXICODONE) 5 MG immediate release tablet Take by mouth. 03/24/22   [provider]  Prenatal MV & Min w/FA-DHA (PRENATAL GUMMIES) 0.18-25 MG CHEW Chew by mouth. Patient not taking: Reported on 07/14/2021    [provider]  Semaglutide,0.25 or 0.5MG /DOS, (OZEMPIC, 0.25 OR 0.5 MG/DOSE,) 2 MG/3ML SOPN Inject 0.25 mg into the skin once a week. 04/14/22   Fenton Foy, NP  Vitamin D, Ergocalciferol, (DRISDOL) 1.25 MG (50000 UNIT) CAPS capsule Take 50,000 Units by mouth once a week. 02/22/22   [provider]      Allergies    Asa [aspirin], Latex, Other, and Avocado    Review of Systems   Review of Systems  Eyes:  Positive for visual disturbance. Negative for photophobia.  Neurological:  Positive for headaches.  All other systems reviewed and are negative.   Physical Exam Updated Vital Signs BP (!) 145/93   Pulse 67   Temp 98.2 F (36.8 C) (Oral)   Resp 17   Ht 5\' 9"  (1.753 m)   Wt (!) 163.3 kg  LMP 09/08/2022   SpO2 99%   BMI 53.16 kg/m  Physical Exam Vitals and nursing note reviewed.  Constitutional:      General: She is not in acute distress.    Appearance: Normal appearance. She is obese. She is not ill-appearing or toxic-appearing.  HENT:     Head: Normocephalic.     Mouth/Throat:     Mouth: Mucous membranes are moist.     Pharynx: Oropharynx is clear.  Eyes:     General: No scleral icterus.    Extraocular Movements: Extraocular movements intact.     Pupils: Pupils are equal, round, and reactive to light. Pupils are equal.  Cardiovascular:     Rate and Rhythm: Normal rate  and regular rhythm.     Heart sounds: Normal heart sounds. No murmur heard. Pulmonary:     Effort: Pulmonary effort is normal. No respiratory distress.     Breath sounds: Normal breath sounds.  Abdominal:     General: Bowel sounds are normal. There is no distension.     Palpations: Abdomen is soft.     Tenderness: There is no abdominal tenderness.  Musculoskeletal:        General: Swelling present.     Cervical back: Neck supple.     Right lower leg: Edema present.     Left lower leg: Edema present.     Comments: Trace BLE pitting edema.   Skin:    General: Skin is warm and dry.     Capillary Refill: Capillary refill takes less than 2 seconds.  Neurological:     General: No focal deficit present.     Mental Status: She is alert and oriented to person, place, and time. Mental status is at baseline.     GCS: GCS eye subscore is 4. GCS verbal subscore is 5. GCS motor subscore is 6.     Cranial Nerves: No cranial nerve deficit or facial asymmetry.     Motor: No weakness.     Comments: Blurry vision in the right upper outer visual field   Psychiatric:        Mood and Affect: Mood normal.        Behavior: Behavior normal.        Thought Content: Thought content normal.        Judgment: Judgment normal.     ED Results / Procedures / Treatments   Labs (all labs ordered are listed, but only abnormal results are displayed) Labs Reviewed  COMPREHENSIVE METABOLIC PANEL - Abnormal; Notable for the following components:      Result Value   Potassium 3.4 (*)    Glucose, Bld 107 (*)    Calcium 8.7 (*)    All other components within normal limits  CBC WITH DIFFERENTIAL/PLATELET - Abnormal; Notable for the following components:   RBC 3.53 (*)    Hemoglobin 10.8 (*)    HCT 33.1 (*)    All other components within normal limits  URINALYSIS, ROUTINE W REFLEX MICROSCOPIC - Abnormal; Notable for the following components:   Color, Urine AMBER (*)    Nitrite POSITIVE (*)    All other  components within normal limits  I-STAT BETA HCG BLOOD, ED ([facility], [facility], [facility] ONLY)  TROPONIN I (HIGH SENSITIVITY)    EKG EKG Interpretation  Date/Time:  Friday September 15 2022 16:25:39 EDT Ventricular Rate:  74 PR Interval:  192 QRS Duration: 95 QT Interval:  394 QTC Calculation: 438 R Axis:   61 Text Interpretation: Sinus rhythm Borderline  T wave abnormalities No old tracing to compare Confirmed by Sherwood Gambler [phone]) on 09/15/2022 4:38:12 PM  Radiology CT ANGIO HEAD W OR WO CONTRAST  Result Date: 09/15/2022 CLINICAL DATA:  Headache EXAM: CT ANGIOGRAPHY HEAD TECHNIQUE: Multidetector CT imaging of the head was performed using the standard protocol during bolus administration of intravenous contrast. Multiplanar CT image reconstructions and MIPs were obtained to evaluate the vascular anatomy. RADIATION DOSE REDUCTION: This exam was performed according to the departmental dose-optimization program which includes automated exposure control, adjustment of the mA and/or kV according to patient size and/or use of iterative reconstruction technique. CONTRAST:  23mL OMNIPAQUE IOHEXOL 350 MG/ML SOLN COMPARISON:  None Available. FINDINGS: CT HEAD Brain: There is no mass, hemorrhage or extra-axial collection. The size and configuration of the ventricles and extra-axial CSF spaces are normal. The brain parenchyma is normal, without acute or chronic infarction. Vascular: No abnormal hyperdensity of the major intracranial arteries or dural venous sinuses. No intracranial atherosclerosis. Skull: The visualized skull base, calvarium and extracranial soft tissues are normal. Sinuses/Orbits: No fluid levels or advanced mucosal thickening of the visualized paranasal sinuses. No mastoid or middle ear effusion. The orbits are normal. CTA HEAD POSTERIOR CIRCULATION: --Vertebral arteries: Normal --Inferior cerebellar arteries: Normal. --Basilar artery: Normal. --Superior cerebellar arteries: Normal. --Posterior cerebral  arteries: Normal. ANTERIOR CIRCULATION: --Intracranial internal carotid arteries: Normal. --Anterior cerebral arteries (ACA): Normal. --Middle cerebral arteries (MCA): Normal. Venous sinuses: As permitted by contrast timing, patent. Anatomic variants: None Review of the MIP images confirms the above findings. IMPRESSION: Normal CTA of the head. Electronically Signed   By: Ulyses Jarred M.D.   On: 09/15/2022 23:06   MR MRV HEAD W WO CONTRAST  Result Date: 09/15/2022 CLINICAL DATA:  Headache hypertension.  Right eye blurry vision. EXAM: MRI HEAD WITHOUT AND WITH CONTRAST MR VENOGRAM HEAD WITHOUT AND WITH CONTRAST TECHNIQUE: Multiplanar, multi-echo pulse sequences of the brain and surrounding structures were acquired without and with intravenous contrast. Angiographic images of the intracranial venous structures were acquired using MRV technique without and with intravenous contrast. CONTRAST:  67mL GADAVIST GADOBUTROL 1 MMOL/ML IV SOLN COMPARISON:  None Available. FINDINGS: MRI HEAD WITHOUT AND WITH CONTRAST Brain: No acute infarct, mass effect or extra-axial collection. No chronic microhemorrhage or siderosis. Normal white matter signal, parenchymal volume and CSF spaces. The midline structures are normal. Vascular: Normal flow voids. Skull and upper cervical spine: Normal marrow signal. Sinuses/Orbits: No acute or significant finding. Other: None.  No abnormal enhancement of the optic nerves. MR VENOGRAM HEAD WITHOUT AND WITH CONTRAST There is no evidence of dural venous sinus or deep cerebral vein thrombosis. No dural venous sinus stenosis. Diminutive transverse sinuses are likely a congenital variant. IMPRESSION: Normal MRI and MRV of the brain. Electronically Signed   By: Ulyses Jarred M.D.   On: 09/15/2022 23:03   MR Brain W and Wo Contrast  Result Date: 09/15/2022 CLINICAL DATA:  Headache hypertension.  Right eye blurry vision. EXAM: MRI HEAD WITHOUT AND WITH CONTRAST MR VENOGRAM HEAD WITHOUT AND WITH  CONTRAST TECHNIQUE: Multiplanar, multi-echo pulse sequences of the brain and surrounding structures were acquired without and with intravenous contrast. Angiographic images of the intracranial venous structures were acquired using MRV technique without and with intravenous contrast. CONTRAST:  87mL GADAVIST GADOBUTROL 1 MMOL/ML IV SOLN COMPARISON:  None Available. FINDINGS: MRI HEAD WITHOUT AND WITH CONTRAST Brain: No acute infarct, mass effect or extra-axial collection. No chronic microhemorrhage or siderosis. Normal white matter signal, parenchymal volume and CSF spaces. The  midline structures are normal. Vascular: Normal flow voids. Skull and upper cervical spine: Normal marrow signal. Sinuses/Orbits: No acute or significant finding. Other: None.  No abnormal enhancement of the optic nerves. MR VENOGRAM HEAD WITHOUT AND WITH CONTRAST There is no evidence of dural venous sinus or deep cerebral vein thrombosis. No dural venous sinus stenosis. Diminutive transverse sinuses are likely a congenital variant. IMPRESSION: Normal MRI and MRV of the brain. Electronically Signed   By: Ulyses Jarred M.D.   On: 09/15/2022 23:03    Procedures Procedures   Medications Ordered in ED Medications  ketorolac (TORADOL) 15 MG/ML injection 15 mg (15 mg Intravenous Given 09/15/22 1726)  ondansetron (ZOFRAN) injection 4 mg (4 mg Intravenous Given 09/15/22 1727)  diphenhydrAMINE (BENADRYL) injection 12.5 mg (12.5 mg Intravenous Given 09/15/22 1726)  gadobutrol (GADAVIST) 1 MMOL/ML injection 10 mL (10 mLs Intravenous Contrast Given 09/15/22 2211)  iohexol (OMNIPAQUE) 350 MG/ML injection 80 mL (80 mLs Intravenous Contrast Given 09/15/22 2240)    ED Course/ Medical Decision Making/ A&P    Medical Decision Making Amount and/or Complexity of Data Reviewed Labs: ordered. Radiology: ordered.  Risk Prescription drug management.  Initial Impression and Ddx 37 year old female who presents to the emergency department with  headache and blurry vision. Patient PMH that increases complexity of ED encounter: Hypertension, obesity Differential: Hypertensive urgency, emergency, PRES syndrome, etc   Interpretation of Diagnostics I independent reviewed and interpreted the labs as followed: cbc with stable anemia, cmp without elyte derangement or AKI. UA without hematuria or proteinuria. Troponin x1 negative.   - I independently visualized the following imaging with scope of interpretation limited to determining acute life threatening conditions related to emergency care: MRV, MRI, which revealed no acute findings.  Patient Reassessment and Ultimate Disposition/Management 37 year old overall well-appearing female who presents with headache, hypertension and blurry vision.  She is complaining of frontal tension type headache but states that she is having blurry vision only in her right eye.  I do basic visual fields with her looking forward she states she sees 2 fingers in the upper outer right visual field.  Only holding up 1.  Pupils are equal and reactive.  There is no other deficits. Will obtain a hypertensive workup including a troponin and will do a CTA of her head.  No neck pain or recent head or neck trauma. Will also give her a headache cocktail and see how this improves her blood pressure prior to giving her antihypertensives.  1hr reassessment after headache cocktail. She has significant improvement in headache and BP. Still having right eye blurry vision.   Labs are unremarkable. No evidence of end organ dysfunction from labs. Still pending brain imaging. Not requesting further meds on reassessment.   - MRV, CTA are negative for acute findings. On reassessment again continues to not have headache. She will need PCP follow-up for BP management. She is still on labetalol from her pregnancy. Likely could benefit from ACE or ARB therapy rather than labetalol at this point.   No clinical or laboratory findings of  hypertensive emergency or PRES syndrome. Will discharge with strict return precautions. Headache likely complex migraine or hypertensive headache improved with HA cocktail.   The patient has been appropriately medically screened and/or stabilized in the ED. I have low suspicion for any other emergent medical condition which would require further screening, evaluation or treatment in the ED or require inpatient management. At time of discharge the patient is hemodynamically stable and in no acute distress. I have discussed  work-up results and diagnosis with patient and answered all questions. Patient is agreeable with discharge plan. We discussed strict return precautions for returning to the emergency department and they verbalized understanding.     Patient management required discussion with the following services or consulting groups:  None  Complexity of Problems Addressed Acute complicated illness or Injury  Additional Data Reviewed and Analyzed Further history obtained from: Further history from spouse/family member, Past medical history and medications listed in the EMR, Prior ED visit notes, and Care Everywhere  Patient Encounter Risk Assessment SDOH impact on management and Use of parenteral controlled substances  Final Clinical Impression(s) / ED Diagnoses Final diagnoses:  Primary hypertension    Rx / DC Orders ED Discharge Orders     None         Mickie Hillier, PA-C 09/15/22 2337    Sherwood Gambler, MD 09/16/22 1626

## 2022-09-15 NOTE — Discharge Instructions (Signed)
You were seen in the emergency department today for headache and high blood pressure. Your labs and imaging were normal and reassuring. Please follow-up with a primary care provider to possibly change your blood pressure medications to obtain better control. Please return for worsening symptoms.

## 2022-09-15 NOTE — ED Triage Notes (Signed)
Pt arrives today c/o headache. Pt states that she works at a [employer], BP was as high as 173/119 at the office. Pt c/o blurred vision right eye. Pt tried taking motrin, had a short period of relief after taking motrin.   Pt took labetalol this morning as prescribed.

## 2022-09-18 ENCOUNTER — Telehealth: Payer: Self-pay

## 2022-09-18 NOTE — Transitions of Care (Post Inpatient/ED Visit) (Cosign Needed)
   09/18/2022  Name: Cassandra White MRN: [id number] DOB: 1985/07/02  Today's TOC FU Call Status: Today's TOC FU Call Status:: Unsuccessul Call (1st Attempt) Unsuccessful Call (1st Attempt) Date: 09/18/22  Attempted to reach the patient regarding the most recent Inpatient/ED visit.  Follow Up Plan: Additional outreach attempts will be made to reach the patient to complete the Transitions of Care (Post Inpatient/ED visit) call.   Signature Gh

## 2022-09-19 ENCOUNTER — Telehealth: Payer: Self-pay

## 2022-09-19 NOTE — Transitions of Care (Post Inpatient/ED Visit) (Signed)
   09/19/2022  Name: Cassandra White MRN: [id number] DOB: 1986/04/17  Today's TOC FU Call Status: Today's TOC FU Call Status:: Unsuccessful Call (2nd Attempt) Unsuccessful Call (2nd Attempt) Date: 09/19/22  Attempted to reach the patient regarding the most recent Inpatient/ED visit.  Follow Up Plan: Additional outreach attempts will be made to reach the patient to complete the Transitions of Care (Post Inpatient/ED visit) call.   [place] RMA

## 2022-09-20 ENCOUNTER — Telehealth: Payer: Self-pay

## 2022-09-20 ENCOUNTER — Inpatient Hospital Stay: Payer: Self-pay | Admitting: Nurse Practitioner

## 2022-09-20 NOTE — Transitions of Care (Post Inpatient/ED Visit) (Cosign Needed)
   09/20/2022  Name: KAMEELA DEMERCHANT MRN: [id number] DOB: [birth year]  Today's TOC FU Call Status: Today's TOC FU Call Status:: Successful TOC FU Call Competed TOC FU Call Complete Date: 09/20/22  Transition Care Management Follow-up Telephone Call Discharge Facility: [facility]) Type of Discharge: Emergency Department Reason for ED Visit: Other: (high blood) How have you been since you were released from the hospital?: Same Any questions or concerns?: No  Items Reviewed: Did you receive and understand the discharge instructions provided?: No Medications obtained and verified?: Yes (Medications Reviewed) Any new allergies since your discharge?: No Dietary orders reviewed?: NA Do you have support at home?: Yes People in Home: spouse  Home Care and Equipment/Supplies: [place] Ordered?: NA Any new equipment or medical supplies ordered?: NA  Functional Questionnaire: Do you need assistance with bathing/showering or dressing?: No Do you need assistance with meal preparation?: No Do you need assistance with eating?: No Do you have difficulty maintaining continence: No Do you need assistance with getting out of bed/getting out of a chair/moving?: No Do you have difficulty managing or taking your medications?: No  Follow up appointments reviewed: PCP Follow-up appointment confirmed?: Yes Date of PCP follow-up appointment?: 09/20/22 Follow-up Provider: [facility] Follow-up appointment confirmed?: NA Do you need transportation to your follow-up appointment?: Yes Do you understand care options if your condition(s) worsen?: Yes-patient verbalized understanding    SIGNATURE. Elyse Jarvis RMA

## 2022-10-04 ENCOUNTER — Inpatient Hospital Stay: Payer: Self-pay | Admitting: Nurse Practitioner

## 2023-02-26 IMAGING — US US OB COMP LESS 14 WK
1 series · 15 of 28 positions shown · non-contrast
Comparison: None.

CLINICAL DATA: Cramping, spotting

EXAM:
OBSTETRIC <14 WK ULTRASOUND
TECHNIQUE: Transabdominal ultrasound was performed for evaluation of the
gestation as well as the maternal uterus and adnexal regions.

[Series 1: us ob comp less 14 wk · 38 acquisitions, 15 frames shown]
[im 1/38]
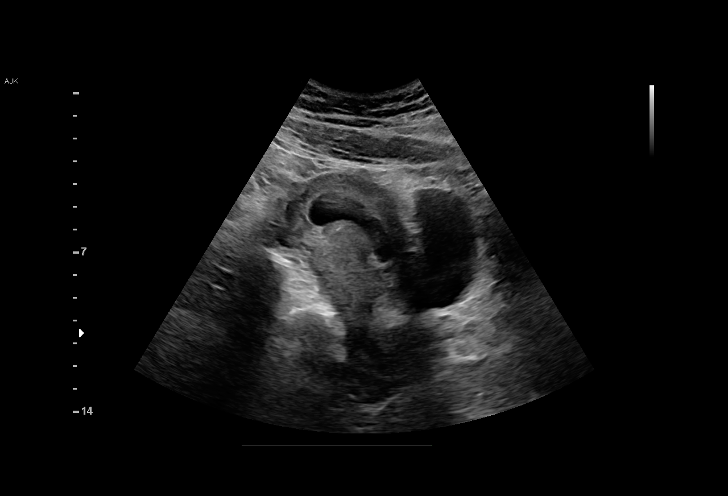
[im 3/38]
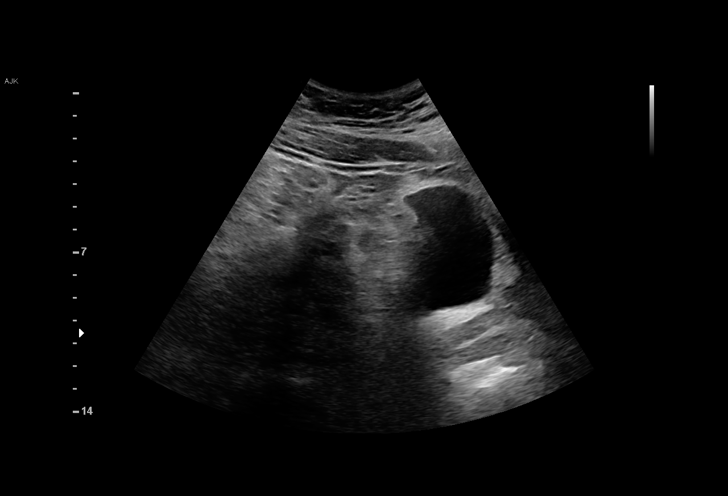
[im 6/38]
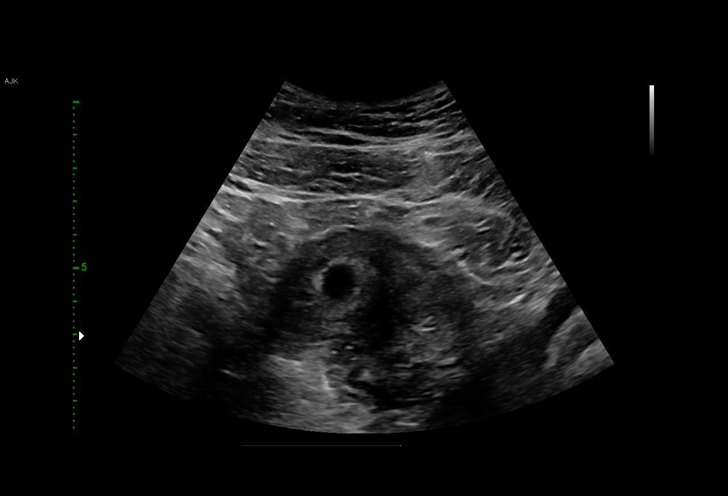
[im 9/38]
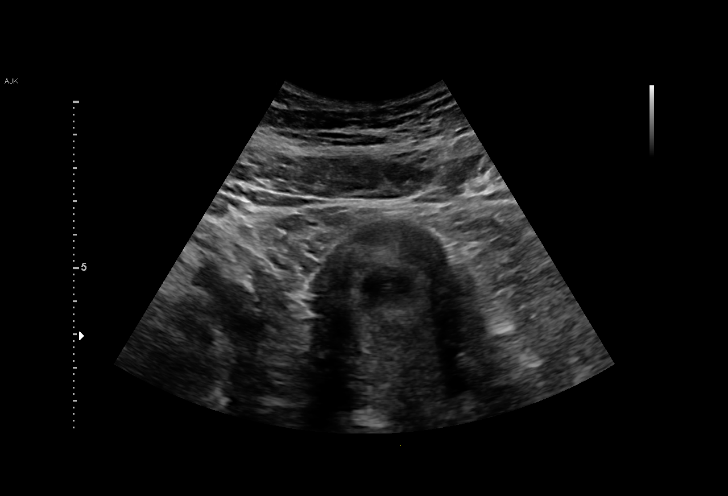
[im 11/38]
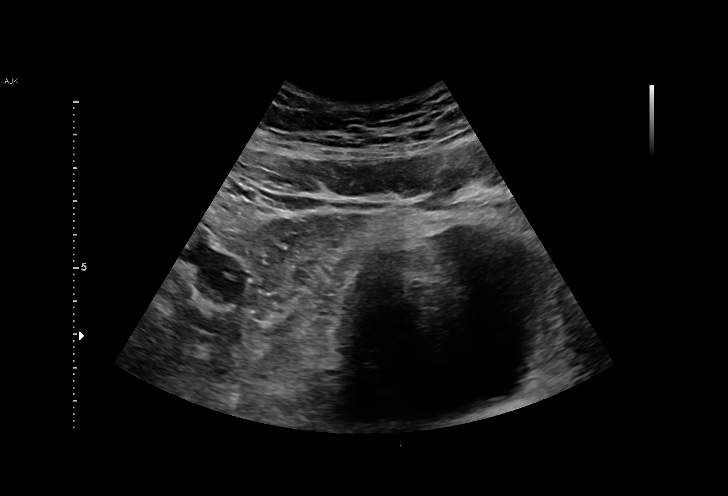
[im 14/38]
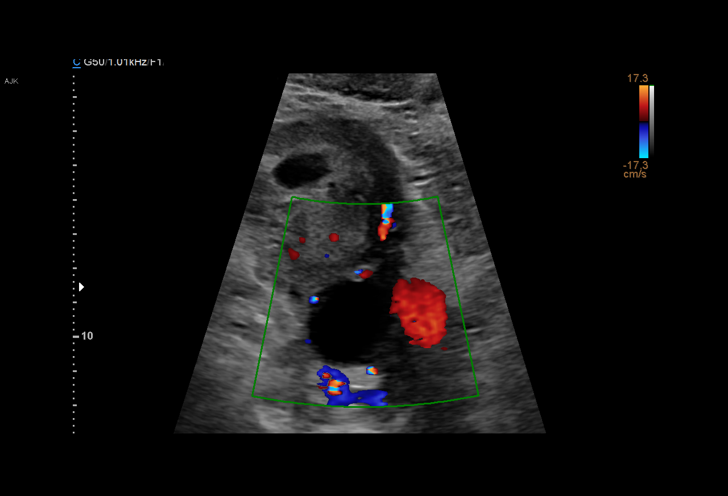
[im 17/38]
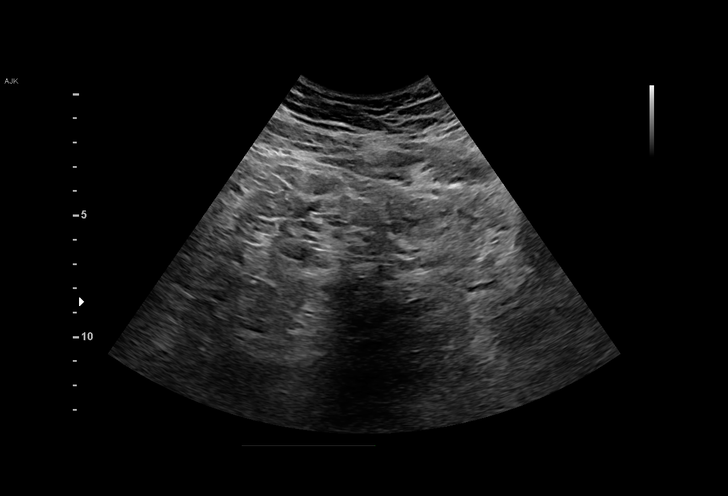
[im 20/38]
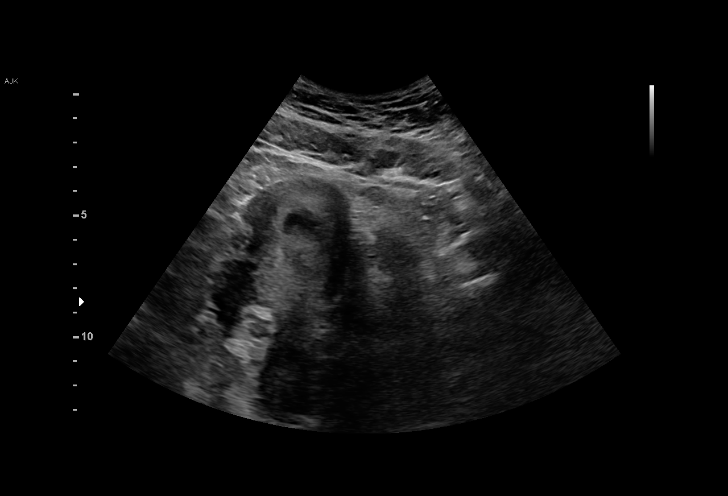
[im 21/38]
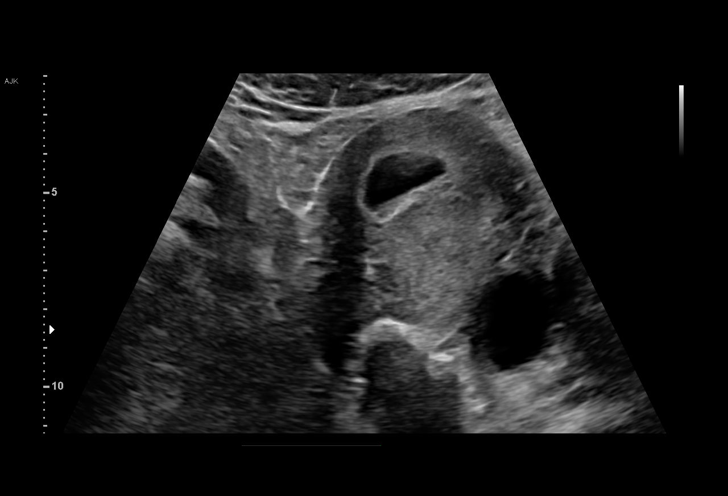
[im 24/38]
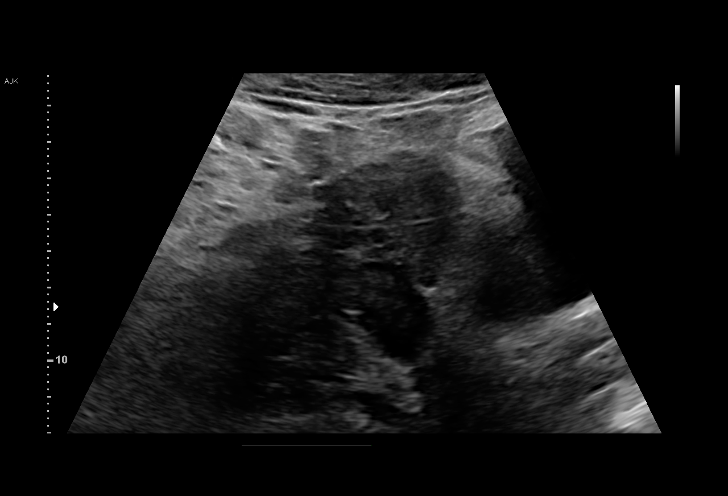
[im 27/38]
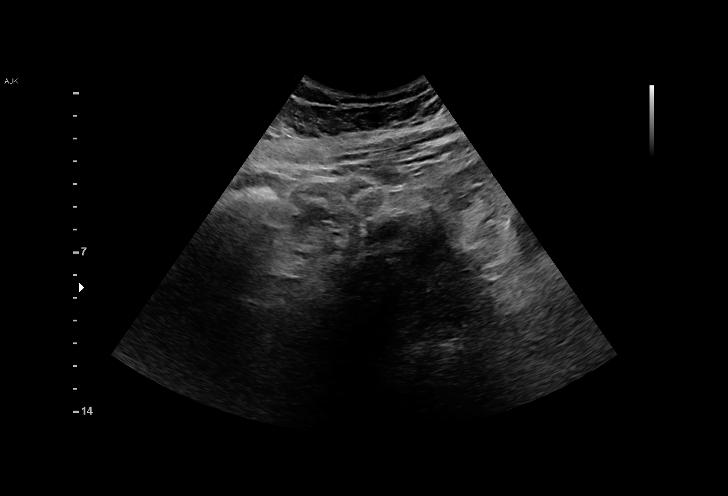
[im 29/38]
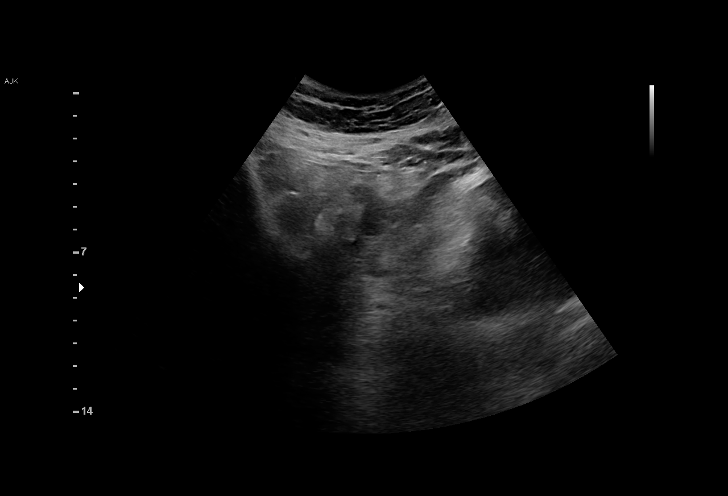
[im 32/38]
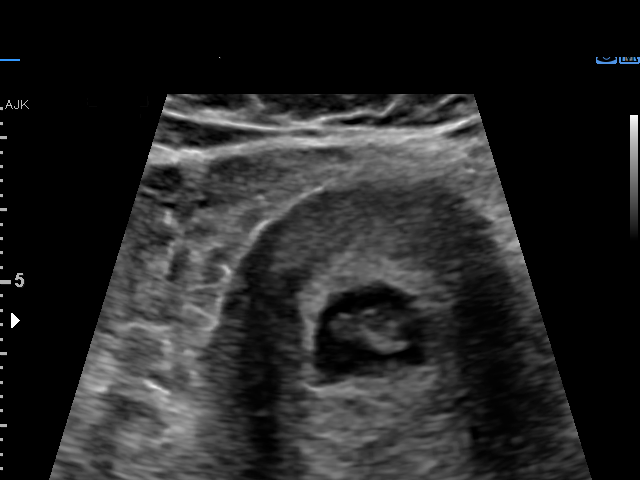
[im 35/38]
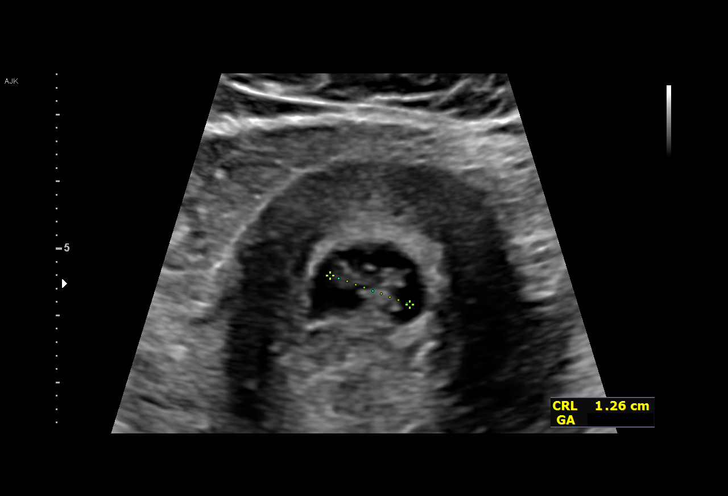
[im 38/38]
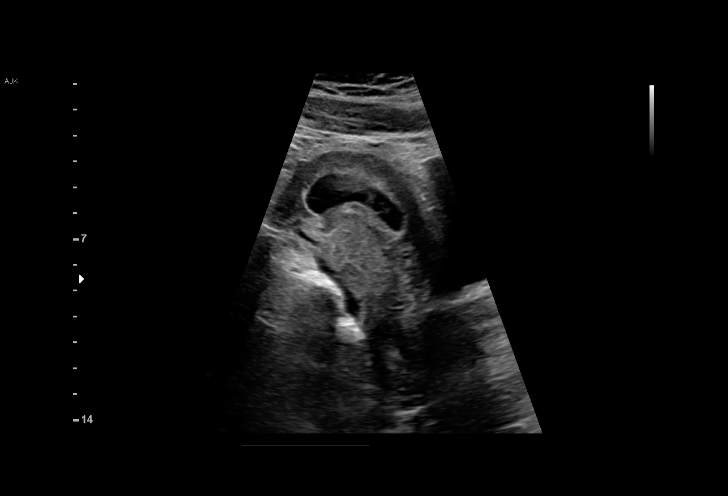

[15 of 28 positions shown; findings below may reference images not displayed]

FINDINGS: Intrauterine gestational sac: Single

Yolk sac:  Visualized.

Embryo:  Visualized.

Cardiac Activity: Visualized.

Heart Rate: 164 bpm

MSD:    mm    w     d

CRL:   12.4 mm   7 w 3 d                  US EDC: 11/11/2021

Subchorionic hemorrhage:  None visualized.

Maternal uterus/adnexae: No adnexal mass or free fluid
IMPRESSION: Seven week 5 day intrauterine pregnancy. Fetal heart rate 164 beats
per minute. No acute maternal findings.

## 2023-03-09 ENCOUNTER — Telehealth: Payer: Self-pay

## 2023-03-09 NOTE — Patient Outreach (Signed)
Care Guide Note  03/09/2023 Name: JEZEBEL FLAMENCO MRN: [id number] DOB: 12-Feb-1986  Referred by: Ivonne Andrew, NP Reason for referral : patient outreach (Outreach to schedule with RPH.)   MALONE SCOBY is a 37 y.o. year old female who is a primary care patient of Ivonne Andrew, NP. Daylene Katayama was referred to the pharmacist for assistance related to HTN.    An unsuccessful telephone outreach was attempted today to contact the patient who was referred to the pharmacy team for assistance with HTN. Additional attempts will be made to contact the patient.   Baruch Gouty [facility] Assistant-Population Health [phone]

## 2023-04-10 ENCOUNTER — Emergency Department (HOSPITAL_BASED_OUTPATIENT_CLINIC_OR_DEPARTMENT_OTHER)
Admission: EM | Admit: 2023-04-10 | Discharge: 2023-04-10 | Disposition: A | Discharge status: Home / Self Care | Payer: No Typology Code available for payment source | Attending: Emergency Medicine | Admitting: Emergency Medicine

## 2023-04-10 ENCOUNTER — Encounter (HOSPITAL_BASED_OUTPATIENT_CLINIC_OR_DEPARTMENT_OTHER): Payer: Self-pay | Admitting: Emergency Medicine

## 2023-04-10 ENCOUNTER — Emergency Department (HOSPITAL_BASED_OUTPATIENT_CLINIC_OR_DEPARTMENT_OTHER): Payer: No Typology Code available for payment source

## 2023-04-10 DIAGNOSIS — R350 Frequency of micturition: Secondary | ICD-10-CM | POA: Diagnosis not present

## 2023-04-10 DIAGNOSIS — J45909 Unspecified asthma, uncomplicated: Secondary | ICD-10-CM | POA: Diagnosis not present

## 2023-04-10 DIAGNOSIS — R112 Nausea with vomiting, unspecified: Secondary | ICD-10-CM | POA: Diagnosis not present

## 2023-04-10 DIAGNOSIS — R519 Headache, unspecified: Secondary | ICD-10-CM | POA: Diagnosis present

## 2023-04-10 DIAGNOSIS — Z79899 Other long term (current) drug therapy: Secondary | ICD-10-CM | POA: Insufficient documentation

## 2023-04-10 DIAGNOSIS — I10 Essential (primary) hypertension: Secondary | ICD-10-CM | POA: Diagnosis not present

## 2023-04-10 DIAGNOSIS — Z794 Long term (current) use of insulin: Secondary | ICD-10-CM | POA: Insufficient documentation

## 2023-04-10 DIAGNOSIS — Z91040 Latex allergy status: Secondary | ICD-10-CM | POA: Insufficient documentation

## 2023-04-10 DIAGNOSIS — R030 Elevated blood-pressure reading, without diagnosis of hypertension: Secondary | ICD-10-CM | POA: Diagnosis not present

## 2023-04-10 LAB — HEPATIC FUNCTION PANEL
ALT: 9 U/L (ref 0–44)
AST: 21 U/L (ref 15–41)
Albumin: 3.9 g/dL (ref 3.5–5.0)
Alkaline Phosphatase: 43 U/L (ref 38–126)
Bilirubin, Direct: 0.1 mg/dL (ref 0.0–0.2)
Indirect Bilirubin: 0.7 mg/dL (ref 0.3–0.9)
Total Bilirubin: 0.8 mg/dL (ref 0.3–1.2)
Total Protein: 7.4 g/dL (ref 6.5–8.1)

## 2023-04-10 LAB — CBC WITH DIFFERENTIAL/PLATELET
Abs Immature Granulocytes: 0.02 10*3/uL (ref 0.00–0.07)
Basophils Absolute: 0 10*3/uL (ref 0.0–0.1)
Basophils Relative: 0 %
Eosinophils Absolute: 0.2 10*3/uL (ref 0.0–0.5)
Eosinophils Relative: 3 %
HCT: 33.9 % — ABNORMAL LOW (ref 36.0–46.0)
Hemoglobin: 11.1 g/dL — ABNORMAL LOW (ref 12.0–15.0)
Immature Granulocytes: 0 %
Lymphocytes Relative: 26 %
Lymphs Abs: 2.1 10*3/uL (ref 0.7–4.0)
MCH: 30.2 pg (ref 26.0–34.0)
MCHC: 32.7 g/dL (ref 30.0–36.0)
MCV: 92.1 fL (ref 80.0–100.0)
Monocytes Absolute: 0.6 10*3/uL (ref 0.1–1.0)
Monocytes Relative: 7 %
Neutro Abs: 5.2 10*3/uL (ref 1.7–7.7)
Neutrophils Relative %: 64 %
Platelets: 350 10*3/uL (ref 150–400)
RBC: 3.68 MIL/uL — ABNORMAL LOW (ref 3.87–5.11)
RDW: 13.1 % (ref 11.5–15.5)
WBC: 8.2 10*3/uL (ref 4.0–10.5)
nRBC: 0 % (ref 0.0–0.2)

## 2023-04-10 LAB — I-STAT VENOUS BLOOD GAS, ED
Acid-Base Excess: 4 mmol/L — ABNORMAL HIGH (ref 0.0–2.0)
Bicarbonate: 30.2 mmol/L — ABNORMAL HIGH (ref 20.0–28.0)
Calcium, Ion: 1.21 mmol/L (ref 1.15–1.40)
HCT: 35 % — ABNORMAL LOW (ref 36.0–46.0)
Hemoglobin: 11.9 g/dL — ABNORMAL LOW (ref 12.0–15.0)
O2 Saturation: 28 %
Potassium: 3.7 mmol/L (ref 3.5–5.1)
Sodium: 137 mmol/L (ref 135–145)
TCO2: 32 mmol/L (ref 22–32)
pCO2, Ven: 49.8 mm[Hg] (ref 44–60)
pH, Ven: 7.39 (ref 7.25–7.43)
pO2, Ven: 19 mm[Hg] — CL (ref 32–45)

## 2023-04-10 LAB — URINALYSIS, ROUTINE W REFLEX MICROSCOPIC
Bacteria, UA: NONE SEEN
Bilirubin Urine: NEGATIVE
Glucose, UA: NEGATIVE mg/dL
Hgb urine dipstick: NEGATIVE
Ketones, ur: NEGATIVE mg/dL
Leukocytes,Ua: NEGATIVE
Nitrite: NEGATIVE
Specific Gravity, Urine: 1.02 (ref 1.005–1.030)
pH: 8 (ref 5.0–8.0)

## 2023-04-10 LAB — BASIC METABOLIC PANEL
Anion gap: 7 (ref 5–15)
BUN: 9 mg/dL (ref 6–20)
CO2: 28 mmol/L (ref 22–32)
Calcium: 9.1 mg/dL (ref 8.9–10.3)
Chloride: 101 mmol/L (ref 98–111)
Creatinine, Ser: 0.84 mg/dL (ref 0.44–1.00)
GFR, Estimated: >60 mL/min (ref >60)
Glucose, Bld: 88 mg/dL (ref 70–99)
Potassium: 3.9 mmol/L (ref 3.5–5.1)
Sodium: 136 mmol/L (ref 135–145)

## 2023-04-10 LAB — LIPASE, BLOOD: Lipase: 17 U/L (ref 11–51)

## 2023-04-10 LAB — BETA-HYDROXYBUTYRIC ACID: Beta-Hydroxybutyric Acid: 0.25 mmol/L (ref 0.05–0.27)

## 2023-04-10 MED ORDER — SODIUM CHLORIDE 0.9 % IV BOLUS
500.0000 mL | Freq: Once | INTRAVENOUS | Status: AC
  Administered 2023-04-10: 500 mL via INTRAVENOUS

## 2023-04-10 MED ORDER — DIPHENHYDRAMINE HCL 50 MG/ML IJ SOLN
50.0000 mg | Freq: Once | INTRAMUSCULAR | Status: AC
  Administered 2023-04-10: 50 mg via INTRAVENOUS
  Filled 2023-04-10: qty 1

## 2023-04-10 MED ORDER — HYDRALAZINE HCL 10 MG PO TABS: 10.0000 mg | ORAL_TABLET | Freq: Three times a day (TID) | ORAL | 0 refills | Status: DC | PRN

## 2023-04-10 MED ORDER — PROCHLORPERAZINE EDISYLATE 10 MG/2ML IJ SOLN
10.0000 mg | Freq: Once | INTRAMUSCULAR | Status: AC
  Administered 2023-04-10: 10 mg via INTRAVENOUS
  Filled 2023-04-10: qty 2

## 2023-04-10 NOTE — ED Provider Notes (Signed)
[place] EMERGENCY DEPARTMENT AT [facility] Provider Note   CSN: [id number] Arrival date & time: 04/10/23  1536     History  Chief Complaint  Patient presents with   Hypertension    BETTY GRENIER is a 37 y.o. female.  The history is provided by the patient, the spouse and medical records. No language interpreter was used.  Hypertension This is a recurrent problem. The current episode started more than 1 week ago. The problem occurs constantly. The problem has not changed since onset.Associated symptoms include headaches. Pertinent negatives include no chest pain, no abdominal pain and no shortness of breath. Nothing aggravates the symptoms. Nothing relieves the symptoms. She has tried nothing for the symptoms. The treatment provided no relief.       Home Medications Prior to Admission medications   Medication Sig Start Date End Date Taking? Authorizing Provider  ferrous sulfate (FERROUSUL) 325 (65 FE) MG tablet Take 1 tablet (325 mg total) by mouth every other day. 07/14/21   Warden Fillers, MD  ferrous sulfate 325 (65 FE) MG tablet Take 325 mg by mouth daily with breakfast.    [provider]  labetalol (NORMODYNE) 300 MG tablet Take 1.5 tablets (450 mg total) by mouth 2 (two) times daily. 07/28/21   Rasch, Victorino Dike I, NP  naproxen (NAPROSYN) 500 MG tablet Take 1 tablet (500 mg total) by mouth 2 (two) times daily. Patient not taking: Reported on 09/20/2022 03/22/22   Sabas Sous, MD  oxyCODONE (OXY IR/ROXICODONE) 5 MG immediate release tablet Take by mouth. 03/24/22   [provider]  Prenatal MV & Min w/FA-DHA (PRENATAL GUMMIES) 0.18-25 MG CHEW Chew by mouth. Patient not taking: Reported on 07/14/2021    [provider]  Semaglutide,0.25 or 0.5MG /DOS, (OZEMPIC, 0.25 OR 0.5 MG/DOSE,) 2 MG/3ML SOPN Inject 0.25 mg into the skin once a week. Patient not taking: Reported on 09/20/2022 04/14/22   Ivonne Andrew, NP  Vitamin D, Ergocalciferol,  (DRISDOL) 1.25 MG (50000 UNIT) CAPS capsule Take 50,000 Units by mouth once a week. 02/22/22   [provider]      Allergies    Asa [aspirin], Latex, Other, and Avocado    Review of Systems   Review of Systems  Constitutional:  Negative for chills, diaphoresis, fatigue and fever.  HENT:  Negative for congestion.   Eyes:  Negative for visual disturbance.  Respiratory:  Negative for cough, chest tightness and shortness of breath.   Cardiovascular:  Negative for chest pain.  Gastrointestinal:  Positive for nausea and vomiting. Negative for abdominal pain, constipation and diarrhea.  Genitourinary:  Positive for frequency. Negative for dysuria and flank pain.  Musculoskeletal:  Negative for back pain, neck pain and neck stiffness.  Neurological:  Positive for headaches. Negative for weakness, light-headedness and numbness.  Psychiatric/Behavioral:  Negative for agitation and confusion.   All other systems reviewed and are negative.   Physical Exam Updated Vital Signs BP (!) 153/94   Pulse 73   Temp 98.5 F (36.9 C) (Oral)   Resp 20   LMP 03/29/2023   SpO2 100%  Physical Exam Vitals and nursing note reviewed.  Constitutional:      General: She is not in acute distress.    Appearance: She is well-developed. She is obese. She is not ill-appearing, toxic-appearing or diaphoretic.  HENT:     Head: Normocephalic and atraumatic.     Right Ear: External ear normal.     Left Ear: External ear normal.  Nose: Nose normal.     Mouth/Throat:     Mouth: Mucous membranes are moist.     Pharynx: No oropharyngeal exudate.  Eyes:     Conjunctiva/sclera: Conjunctivae normal.     Pupils: Pupils are equal, round, and reactive to light.  Cardiovascular:     Rate and Rhythm: Normal rate.     Heart sounds: No murmur heard. Pulmonary:     Effort: Pulmonary effort is normal. No respiratory distress.     Breath sounds: No stridor. No wheezing, rhonchi or rales.  Chest:     Chest  wall: No tenderness.  Abdominal:     General: Abdomen is flat. There is no distension.     Tenderness: There is no abdominal tenderness. There is no right CVA tenderness, left CVA tenderness, guarding or rebound.  Musculoskeletal:        General: No tenderness.     Cervical back: Normal range of motion and neck supple. No tenderness.     Right lower leg: No edema.     Left lower leg: No edema.  Skin:    General: Skin is warm.     Coloration: Skin is not pale.     Findings: No erythema or rash.  Neurological:     General: No focal deficit present.     Mental Status: She is alert and oriented to person, place, and time.     Sensory: No sensory deficit.     Motor: No weakness or abnormal muscle tone.     Deep Tendon Reflexes: Reflexes are normal and symmetric.  Psychiatric:        Mood and Affect: Mood normal.     ED Results / Procedures / Treatments   Labs (all labs ordered are listed, but only abnormal results are displayed) Labs Reviewed  CBC WITH DIFFERENTIAL/PLATELET - Abnormal; Notable for the following components:      Result Value   RBC 3.68 (*)    Hemoglobin 11.1 (*)    HCT 33.9 (*)    All other components within normal limits  URINALYSIS, ROUTINE W REFLEX MICROSCOPIC - Abnormal; Notable for the following components:   APPearance HAZY (*)    Protein, ur TRACE (*)    All other components within normal limits  I-STAT VENOUS BLOOD GAS, ED - Abnormal; Notable for the following components:   pO2, Ven 19 (*)    Bicarbonate 30.2 (*)    Acid-Base Excess 4.0 (*)    HCT 35.0 (*)    Hemoglobin 11.9 (*)    All other components within normal limits  HEPATIC FUNCTION PANEL  LIPASE, BLOOD  BETA-HYDROXYBUTYRIC ACID  BASIC METABOLIC PANEL    EKG None  Radiology DG Chest Portable 1 View  Result Date: 04/10/2023 CLINICAL DATA:  Recent cough EXAM: PORTABLE CHEST 1 VIEW COMPARISON:  Chest x-ray 07/23/2017 FINDINGS: Cardiac silhouette is borderline enlarged, a new finding.  There is no focal lung infiltrate, pleural effusion or pneumothorax. The osseous structures appear normal. IMPRESSION: Borderline cardiomegaly, a new finding. Please correlate clinically for cardiomegaly or pericardial effusion. No focal lung infiltrate. Electronically Signed   By: Darliss Cheney M.D.   On: 04/10/2023 22:14    Procedures Procedures    Medications Ordered in ED Medications  prochlorperazine (COMPAZINE) injection 10 mg (10 mg Intravenous Given 04/10/23 2032)  diphenhydrAMINE (BENADRYL) injection 50 mg (50 mg Intravenous Given 04/10/23 2037)  sodium chloride 0.9 % bolus 500 mL (0 mLs Intravenous Stopped 04/10/23 2200)    ED Course/ Medical  Decision Making/ A&P                                 Medical Decision Making Amount and/or Complexity of Data Reviewed Labs: ordered. Radiology: ordered.  Risk Prescription drug management.    DANIKA KLOSTERMANN is a 37 y.o. female with a past medical history significant for previous DVT, PCOS, asthma, and previous hypertension who presents with nausea, vomiting, urinary frequency, fatigue, elevated blood pressure, and headaches.  According to patient, she has had elevated blood pressures for the last year and is concerned that her labetalol is not managing her blood pressure well.  She said that her blood pressure has frequently been over 200 and today it continues to do so.  She was having headaches that have associated photophobia and phonophobia.  She denies neck pain or neck stiffness and denies trauma.  She reports she has had these headaches in the past when her blood pressure has been high.  Blood pressures over 200 earlier despite her labetalol.  She has had on and off nausea and vomiting and says she has some gallstones but is not having abdominal pain.  She reports she is having more urinary frequency recently but denies any glucose issues.  She denies any new leg pain or leg swelling denies history of blood clots.  Denies any chest  pain, palpitations, shortness of breath.  She reports she is anxious and stressed about her blood pressure being high and is concerned and wants to have a new PCP.  On exam, lungs were clear.  Chest nontender.  Abdomen nontender.  Good pulses in extremities.  Legs nontender and not critically edematous.  Normal neck range of motion without neck tenderness.  No focal neurologic deficits.  Pupils symmetric and reactive with normal extract movements.  Symmetric smile.  Clear speech.  Patient otherwise well-appearing.  Blood pressure has begun to improve since being the emergency department.  Family reported it was over 200 but now it is in the 140s systolic.  Headache is also improved.  Clinically I do feel this is likely a combination of things causing her blood pressure to be up as well as have these headaches.  Some of her symptoms are migrainous in quality with the photophobia and phonophobia.  She reports she has had a CT head that was reassuring last time she had similar symptoms so she does not want a repeat image today.  This is reasonable.  Will give her a headache cocktail and get screening labs.  With this urinary frequency with nausea and vomiting we will get labs make sure she does not have new onset diabetes or DKA or your very tract infection.  Will look for other infections as she did have some coughing yesterday will get chest x-ray.  If workup reassuring, anticipate having her follow-up with PCP and give her prescription for rescue blood pressure medication to use in conjunction with her home medicine until she can see a primary doctor to have other medications started or titrated.  Family and patient agree with this plan, anticipate reassessment.  Of note patient reports there is no way she could be pregnant with her female spouse.   11:10 PM Workup returned overall reassuring.  No evidence of DKA.  Urinalysis does not show UTI.  Chest x-ray shows no abnormality aside from some enlarged  heart and she will follow-up with her primary doctor for this.  No pneumonia.  Patient's workup otherwise reassuring and we feel she is safe for discharge home.  Patient will follow-up with primary team and will give a prescription for some hydralazine to help with blood pressure does not respond to her home medications.  She will talk to them about medication changes.  She no depressions or concerns and will be discharged.          Final Clinical Impression(s) / ED Diagnoses Final diagnoses:  Elevated blood pressure reading  Nonintractable headache, unspecified chronicity pattern, unspecified headache type    Rx / DC Orders ED Discharge Orders          Ordered    hydrALAZINE (APRESOLINE) 10 MG tablet  3 times daily PRN        04/10/23 2318            Clinical Impression: 1. Elevated blood pressure reading   2. Nonintractable headache, unspecified chronicity pattern, unspecified headache type     Disposition: Discharge  Condition: Good  I have discussed the results, Dx and Tx plan with the pt(& family if present). He/she/they expressed understanding and agree(s) with the plan. Discharge instructions discussed at great length. Strict return precautions discussed and pt &/or family have verbalized understanding of the instructions. No further questions at time of discharge.    New Prescriptions   HYDRALAZINE (APRESOLINE) 10 MG TABLET    Take 1 tablet (10 mg total) by mouth 3 (three) times daily as needed (If blood pressure over 180 and not responding to home medication).    Follow Up: Ivonne Andrew, NP [address] [place] [address] [phone]     [facility] [address] [place] [address] [place] [address] [phone] Schedule an appointment as soon as possible for a visit        Luz Burcher, Canary Brim, MD 04/10/23 2321

## 2023-04-10 NOTE — ED Notes (Signed)
Patient reports headache with elevated BP. Patient denies chest pain, shortness of breath, N/V/D, dizziness, and vision changes.

## 2023-04-10 NOTE — ED Triage Notes (Signed)
High BP., nausea and headache Home BP 191/108 Hx htn on labetalol.

## 2023-04-10 NOTE — Discharge Instructions (Addendum)
Your history, exam, workup today were overall reassuring.  We agreed to hold on imaging of your head as your headache was already improving and after headache medications it resolved.  Your labs did not show evidence of a cardiac cause of your symptoms and your blood pressure had improved here.  We did agree after shared decision-making conversation to give you a prescription for some rescue blood pressure medicine but please call your doctor tomorrow to discuss other blood pressure medication changes.  Please rest and stay hydrated.  If any symptoms change or worsen acutely, please return to the nearest emergency department.

## 2023-07-04 ENCOUNTER — Ambulatory Visit
Admission: EM | Admit: 2023-07-04 | Discharge: 2023-07-04 | Disposition: A | Discharge status: Home / Self Care | Payer: No Typology Code available for payment source | Attending: Family Medicine | Admitting: Family Medicine

## 2023-07-04 DIAGNOSIS — B349 Viral infection, unspecified: Secondary | ICD-10-CM

## 2023-07-04 DIAGNOSIS — R051 Acute cough: Secondary | ICD-10-CM | POA: Diagnosis not present

## 2023-07-04 LAB — POC COVID19/FLU A&B COMBO
Covid Antigen, POC: NEGATIVE
Influenza A Antigen, POC: NEGATIVE
Influenza B Antigen, POC: NEGATIVE

## 2023-07-04 MED ORDER — BENZONATATE 200 MG PO CAPS: 200.0000 mg | ORAL_CAPSULE | Freq: Three times a day (TID) | ORAL | 0 refills | Status: DC | PRN

## 2023-07-04 NOTE — ED Provider Notes (Signed)
 [facility]    CSN: [id number] Arrival date & time: 07/04/23  1156      History   Chief Complaint No chief complaint on file.   HPI Cassandra White is a 38 y.o. female  presents for evaluation of URI symptoms for 5 days. Patient reports associated symptoms of cough, congestion, sore throat, shortness of breath, low of taste and smell. Denies N/V/D, fevers, ear pain, body aches. Patient does not have a hx of asthma. Patient is not an active smoker.   Reports exposure RSV via family members.  Pt has taken Mucinex and allergy medication OTC for symptoms. Pt has no other concerns at this time.   HPI  Past Medical History:  Diagnosis Date   Anemia    Asthma    Breast disorder    lump in right breast   Chronic hypertension during pregnancy, antepartum 06/02/2021   DVT (deep venous thrombosis) (HCC) 2019   Edema of left lower extremity    Since age 39    Fibrocystic breast changes    lump in right breast U/S 11/22/11: Multiple complicated cysts with mural nodularity as discussed above. Given the appearance, I recommend tissue sampling of the most complicated appearing lesion. If this demonstrates benign findings, recommend following the remaining lesions. Ultrasound- guided core biopsy was discussed with the patient. This will be scheduled per patient preference.  BI-RADS CATEGO   History of blood clots    right leg, left leg, right forearm   Obese    PCOS (polycystic ovarian syndrome)     Patient Active Problem List   Diagnosis Date Noted   Class 3 severe obesity due to excess calories without serious comorbidity with body mass index (BMI) of 50.0 to 59.9 in adult [facility]) 04/14/2022   Anemia 06/30/2021   Postpartum hemorrhage 06/30/2021   Cervical incompetence, with delivery 06/29/2021   Cervical insufficiency during pregnancy in second trimester, antepartum 06/19/2021   Cervical incompetence, delivered 06/16/2021   Anticoagulant long-term use 06/02/2021   Obesity in  pregnancy 06/02/2021   Chronic hypertension during pregnancy, antepartum 06/02/2021   History of gastric ulcer 06/02/2021   Supervision of high risk pregnancy, antepartum 04/20/2021   Sciatica 04/20/2021   Deep vein thrombosis (DVT) of right radial vein (HCC) 12/29/2016   BMI 50.0-59.9, adult (HCC) 08/16/2006    Past Surgical History:  Procedure Laterality Date   bunectomy Bilateral 2014   BUNIONECTOMY     right foot   CERVICAL CERCLAGE N/A 06/17/2021   Procedure: CERCLAGE CERVICAL;  Surgeon: Wendelyn Halter, MD;  Location: [facility] LD ORS;  Service: Gynecology;  Laterality: N/A;   COLPOSCOPY  2009/2010   DILATION AND CURETTAGE OF UTERUS  06/19/2021    OB History     Gravida  1   Para  1   Term  0   Preterm  0   AB  0   Living         SAB  0   IAB  0   Ectopic  0   Multiple      Live Births  1            Home Medications    Prior to Admission medications   Medication Sig Start Date End Date Taking? Authorizing Provider  benzonatate  (TESSALON ) 200 MG capsule Take 1 capsule (200 mg total) by mouth 3 (three) times daily as needed. 07/04/23  Yes Rayelle Armor, Jodi R, NP  ferrous sulfate  (FERROUSUL) 325 (65 FE) MG tablet Take 1  tablet (325 mg total) by mouth every other day. 07/14/21   Abigail Abler, MD  ferrous sulfate  325 (65 FE) MG tablet Take 325 mg by mouth daily with breakfast.    [provider]  hydrALAZINE  (APRESOLINE ) 10 MG tablet Take 1 tablet (10 mg total) by mouth 3 (three) times daily as needed (If blood pressure over 180 and not responding to home medication). 04/10/23   Tegeler, Marine Sia, MD  labetalol  (NORMODYNE ) 300 MG tablet Take 1.5 tablets (450 mg total) by mouth 2 (two) times daily. 07/28/21   Rasch, Bridgette Campus I, NP  naproxen  (NAPROSYN ) 500 MG tablet Take 1 tablet (500 mg total) by mouth 2 (two) times daily. Patient not taking: Reported on 09/20/2022 03/22/22   Edson Graces, MD  oxyCODONE  (OXY IR/ROXICODONE ) 5 MG immediate release tablet  Take by mouth. 03/24/22   [provider]  Prenatal MV & Min w/FA-DHA (PRENATAL GUMMIES) 0.18-25 MG CHEW Chew by mouth. Patient not taking: Reported on 07/14/2021    [provider]  Semaglutide ,0.25 or 0.5MG /DOS, (OZEMPIC , 0.25 OR 0.5 MG/DOSE,) 2 MG/3ML SOPN Inject 0.25 mg into the skin once a week. Patient not taking: Reported on 09/20/2022 04/14/22   Nichols, Tonya S, NP  Vitamin D, Ergocalciferol, (DRISDOL) 1.25 MG (50000 UNIT) CAPS capsule Take 50,000 Units by mouth once a week. 02/22/22   [provider]    Family History Family History  Problem Relation Age of Onset   Hypertension Mother    Asthma Mother    Pulmonary embolism Mother    Deep vein thrombosis Mother    Asthma Brother    Diabetes Father    Cancer Maternal Uncle        lung   Cancer Maternal Grandmother        brain   Cancer Maternal Grandfather        pancreatic   Diabetes Paternal Grandmother    Diabetes Paternal Grandfather     Social History Social History   Tobacco Use   Smoking status: Never   Smokeless tobacco: Never  Vaping Use   Vaping status: Never Used  Substance Use Topics   Alcohol use: Never    Comment: socially   Drug use: Never     Allergies   Asa [aspirin], Latex, Other, and Avocado   Review of Systems Review of Systems  HENT:  Positive for congestion and sore throat.   Respiratory:  Positive for cough and shortness of breath.      Physical Exam Triage Vital Signs ED Triage Vitals  Encounter Vitals Group     BP 07/04/23 1256 (!) 162/103     Systolic BP Percentile --      Diastolic BP Percentile --      Pulse Rate 07/04/23 1256 90     Resp 07/04/23 1256 18     Temp 07/04/23 1256 98.1 F (36.7 C)     Temp Source 07/04/23 1256 Oral     SpO2 07/04/23 1256 96 %     Weight --      Height --      Head Circumference --      Peak Flow --      Pain Score 07/04/23 1254 0     Pain Loc --      Pain Education --      Exclude from Growth Chart --     No data found.  Updated Vital Signs BP (!) 162/103 (BP Location: Left Arm)   Pulse 90  Temp 98.1 F (36.7 C) (Oral)   Resp 18   SpO2 96%   Visual Acuity Right Eye Distance:   Left Eye Distance:   Bilateral Distance:    Right Eye Near:   Left Eye Near:    Bilateral Near:     Physical Exam Vitals and nursing note reviewed.  Constitutional:      General: She is not in acute distress.    Appearance: She is well-developed. She is not ill-appearing.  HENT:     Head: Normocephalic and atraumatic.     Right Ear: Tympanic membrane and ear canal normal.     Left Ear: Tympanic membrane and ear canal normal.     Nose: Congestion present.     Mouth/Throat:     Mouth: Mucous membranes are moist.     Pharynx: Oropharynx is clear. Uvula midline. No oropharyngeal exudate or posterior oropharyngeal erythema.     Tonsils: No tonsillar exudate or tonsillar abscesses.  Eyes:     Conjunctiva/sclera: Conjunctivae normal.     Pupils: Pupils are equal, round, and reactive to light.  Cardiovascular:     Rate and Rhythm: Normal rate and regular rhythm.     Heart sounds: Normal heart sounds.  Pulmonary:     Effort: Pulmonary effort is normal.     Breath sounds: Normal breath sounds. No wheezing, rhonchi or rales.  Musculoskeletal:     Cervical back: Normal range of motion and neck supple.  Lymphadenopathy:     Cervical: No cervical adenopathy.  Skin:    General: Skin is warm and dry.  Neurological:     General: No focal deficit present.     Mental Status: She is alert and oriented to person, place, and time.  Psychiatric:        Mood and Affect: Mood normal.        Behavior: Behavior normal.      UC Treatments / Results  Labs (all labs ordered are listed, but only abnormal results are displayed) Labs Reviewed  POC COVID19/FLU A&B COMBO    EKG   Radiology No results found.  Procedures Procedures (including critical care time)  Medications Ordered in UC Medications -  No data to display  Initial Impression / Assessment and Plan / UC Course  I have reviewed the triage vital signs and the nursing notes.  Pertinent labs & imaging results that were available during my care of the patient were reviewed by me and considered in my medical decision making (see chart for details).     Reviewed exam and symptoms with patient.  No red flags.  Negative rapid flu and COVID.  Discussed viral illness and symptomatic treatment.  Tessalon  as needed for cough.  PCP follow-up as symptoms do not improve.  ER precautions reviewed. Final Clinical Impressions(s) / UC Diagnoses   Final diagnoses:  Acute cough  Viral illness     Discharge Instructions      Start Tessalon  as needed for cough.  Lots of rest and fluids.  Please follow-up with your PCP if your symptoms do not improve.  Please go to the ER for any worsening symptoms.  I hope you feel better soon!     ED Prescriptions     Medication Sig Dispense Auth. Provider   benzonatate  (TESSALON ) 200 MG capsule Take 1 capsule (200 mg total) by mouth 3 (three) times daily as needed. 20 capsule Satin Boal, Jodi R, NP      PDMP not reviewed this encounter.   Zettie Hillock,  Jodi R, NP 07/04/23 [phone]

## 2023-07-04 NOTE — Discharge Instructions (Addendum)
 Start Tessalon  as needed for cough.  Lots of rest and fluids.  Please follow-up with your PCP if your symptoms do not improve.  Please go to the ER for any worsening symptoms.  I hope you feel better soon!

## 2023-07-04 NOTE — ED Triage Notes (Signed)
 Pt presents to UC for c/o sob, nasal congestion, sore throat x5 days Loss of smell and taste since yesterday. Family positive for RSV Taking mucinex, allergy meds

## 2023-07-15 ENCOUNTER — Emergency Department (HOSPITAL_COMMUNITY): Payer: No Typology Code available for payment source

## 2023-07-15 ENCOUNTER — Other Ambulatory Visit: Payer: Self-pay

## 2023-07-15 ENCOUNTER — Emergency Department (HOSPITAL_COMMUNITY)
Admission: EM | Admit: 2023-07-15 | Discharge: 2023-07-15 | Disposition: A | Discharge status: Home / Self Care | Payer: No Typology Code available for payment source | Attending: Student | Admitting: Student

## 2023-07-15 ENCOUNTER — Encounter (HOSPITAL_COMMUNITY): Payer: Self-pay

## 2023-07-15 DIAGNOSIS — K8050 Calculus of bile duct without cholangitis or cholecystitis without obstruction: Secondary | ICD-10-CM | POA: Insufficient documentation

## 2023-07-15 DIAGNOSIS — Z91040 Latex allergy status: Secondary | ICD-10-CM | POA: Insufficient documentation

## 2023-07-15 DIAGNOSIS — K8020 Calculus of gallbladder without cholecystitis without obstruction: Secondary | ICD-10-CM | POA: Insufficient documentation

## 2023-07-15 DIAGNOSIS — R1011 Right upper quadrant pain: Secondary | ICD-10-CM | POA: Diagnosis present

## 2023-07-15 LAB — COMPREHENSIVE METABOLIC PANEL
ALT: 11 U/L (ref 0–44)
AST: 16 U/L (ref 15–41)
Albumin: 3.9 g/dL (ref 3.5–5.0)
Alkaline Phosphatase: 44 U/L (ref 38–126)
Anion gap: 9 (ref 5–15)
BUN: 9 mg/dL (ref 6–20)
CO2: 24 mmol/L (ref 22–32)
Calcium: 9.1 mg/dL (ref 8.9–10.3)
Chloride: 104 mmol/L (ref 98–111)
Creatinine, Ser: 0.83 mg/dL (ref 0.44–1.00)
GFR, Estimated: >60 mL/min (ref >60)
Glucose, Bld: 145 mg/dL — ABNORMAL HIGH (ref 70–99)
Potassium: 3.4 mmol/L — ABNORMAL LOW (ref 3.5–5.1)
Sodium: 137 mmol/L (ref 135–145)
Total Bilirubin: 0.6 mg/dL (ref 0.0–1.2)
Total Protein: 7.8 g/dL (ref 6.5–8.1)

## 2023-07-15 LAB — CBC WITH DIFFERENTIAL/PLATELET
Abs Immature Granulocytes: 0.05 10*3/uL (ref 0.00–0.07)
Basophils Absolute: 0 10*3/uL (ref 0.0–0.1)
Basophils Relative: 0 %
Eosinophils Absolute: 0.1 10*3/uL (ref 0.0–0.5)
Eosinophils Relative: 1 %
HCT: 34 % — ABNORMAL LOW (ref 36.0–46.0)
Hemoglobin: 10.8 g/dL — ABNORMAL LOW (ref 12.0–15.0)
Immature Granulocytes: 1 %
Lymphocytes Relative: 17 %
Lymphs Abs: 1.5 10*3/uL (ref 0.7–4.0)
MCH: 30.4 pg (ref 26.0–34.0)
MCHC: 31.8 g/dL (ref 30.0–36.0)
MCV: 95.8 fL (ref 80.0–100.0)
Monocytes Absolute: 0.6 10*3/uL (ref 0.1–1.0)
Monocytes Relative: 6 %
Neutro Abs: 6.8 10*3/uL (ref 1.7–7.7)
Neutrophils Relative %: 75 %
Platelets: 382 10*3/uL (ref 150–400)
RBC: 3.55 MIL/uL — ABNORMAL LOW (ref 3.87–5.11)
RDW: 12.9 % (ref 11.5–15.5)
WBC: 9.1 10*3/uL (ref 4.0–10.5)
nRBC: 0 % (ref 0.0–0.2)

## 2023-07-15 LAB — PREGNANCY, URINE: Preg Test, Ur: NEGATIVE

## 2023-07-15 LAB — LIPASE, BLOOD: Lipase: 27 U/L (ref 11–51)

## 2023-07-15 MED ORDER — OXYCODONE HCL 5 MG PO TABS: 5.0000 mg | ORAL_TABLET | Freq: Four times a day (QID) | ORAL | 0 refills | Status: DC | PRN

## 2023-07-15 MED ORDER — ONDANSETRON HCL 4 MG/2ML IJ SOLN
4.0000 mg | Freq: Once | INTRAMUSCULAR | Status: AC
  Administered 2023-07-15: 4 mg via INTRAVENOUS
  Filled 2023-07-15: qty 2

## 2023-07-15 MED ORDER — ONDANSETRON 4 MG PO TBDP: 4.0000 mg | ORAL_TABLET | Freq: Three times a day (TID) | ORAL | 0 refills | Status: DC | PRN

## 2023-07-15 MED ORDER — KETOROLAC TROMETHAMINE 30 MG/ML IJ SOLN
30.0000 mg | Freq: Once | INTRAMUSCULAR | Status: AC
  Administered 2023-07-15: 30 mg via INTRAVENOUS
  Filled 2023-07-15: qty 1

## 2023-07-15 MED ORDER — HYDROMORPHONE HCL 1 MG/ML IJ SOLN
0.5000 mg | Freq: Once | INTRAMUSCULAR | Status: AC
Start: 2023-07-15 — End: 2023-07-15
  Administered 2023-07-15: 0.5 mg via INTRAVENOUS
  Filled 2023-07-15: qty 1

## 2023-07-15 NOTE — Discharge Instructions (Addendum)
I have prescribed you oxycodone for breakthrough pain.  This is a narcotic pain medicine.  Do not mix with alcohol drugs or dangerous activities including driving.  Follow-up with general surgery.  Return if symptoms worsen fever and pain.

## 2023-07-15 NOTE — ED Triage Notes (Signed)
BIB EMS/ from home/ RUQ pain around 2300 last night/ radiates to back/ hx of gall stones/ N/V/ pt is ambulatory and A&Ox4

## 2023-07-15 NOTE — ED Provider Notes (Signed)
Shared PA visit.  Patient here right upper quadrant pain since last night.  History of gallstones.  Differential diagnosis likely biliary colic versus cholecystitis cholelithiasis.  Have low suspicion for appendicitis bowel obstruction given history and physical.  Pain is focal in the right upper quadrant.  She is feeling much better now that she has had some pain meds and nausea meds.  She has had gallstones in the past.  Did not elect to have surgery earlier. Lab work per my review interpretation is unremarkable.  No significant anemia leukocytosis electrolyte abnormality gallbladder or liver enzyme elevation.  Labs are unremarkable.  Ultrasound shows gallstones but no evidence of cholecystitis.  She is not really tender on repeat exam.  She does not have any tenderness in the right lower quadrant or elsewhere.  Have no concern for appendicitis or bowel obstruction at this time.  Stable tolerate p.o.  Pregnancy test negative.  Will prescribe oxycodone, Zofran for pain and nausea.  Will have her follow-up with general surgery.  She understands return precautions including worsening pain fever.  This chart was dictated using voice recognition software.  Despite best efforts to proofread,  errors can occur which can change the documentation meaning.    Virgina Norfolk, DO 07/15/23 [phone]

## 2023-07-15 NOTE — ED Provider Notes (Signed)
[place] EMERGENCY DEPARTMENT AT [facility] Provider Note   CSN: [id number] Arrival date & time: 07/15/23  9563     History {Add pertinent medical, surgical, social history, OB history to HPI:1} Chief Complaint  Patient presents with   Abdominal Pain    Cassandra White is a 38 y.o. female.  RUQ pain since last night around 11:00. History of gall stones, feels the same. No fever. Vomited x 1. Last ate at 9:00 pm.   The history is provided by the patient. No language interpreter was used.  Abdominal Pain      Home Medications Prior to Admission medications   Medication Sig Start Date End Date Taking? Authorizing Provider  benzonatate (TESSALON) 200 MG capsule Take 1 capsule (200 mg total) by mouth 3 (three) times daily as needed. 07/04/23   Radford Pax, NP  ferrous sulfate (FERROUSUL) 325 (65 FE) MG tablet Take 1 tablet (325 mg total) by mouth every other day. 07/14/21   Warden Fillers, MD  ferrous sulfate 325 (65 FE) MG tablet Take 325 mg by mouth daily with breakfast.    [provider]  hydrALAZINE (APRESOLINE) 10 MG tablet Take 1 tablet (10 mg total) by mouth 3 (three) times daily as needed (If blood pressure over 180 and not responding to home medication). 04/10/23   Tegeler, Canary Brim, MD  labetalol (NORMODYNE) 300 MG tablet Take 1.5 tablets (450 mg total) by mouth 2 (two) times daily. 07/28/21   Rasch, Victorino Dike I, NP  naproxen (NAPROSYN) 500 MG tablet Take 1 tablet (500 mg total) by mouth 2 (two) times daily. Patient not taking: Reported on 09/20/2022 03/22/22   Sabas Sous, MD  oxyCODONE (OXY IR/ROXICODONE) 5 MG immediate release tablet Take by mouth. 03/24/22   [provider]  Prenatal MV & Min w/FA-DHA (PRENATAL GUMMIES) 0.18-25 MG CHEW Chew by mouth. Patient not taking: Reported on 07/14/2021    [provider]  Semaglutide,0.25 or 0.5MG /DOS, (OZEMPIC, 0.25 OR 0.5 MG/DOSE,) 2 MG/3ML SOPN Inject 0.25 mg into the skin once a  week. Patient not taking: Reported on 09/20/2022 04/14/22   Ivonne Andrew, NP  Vitamin D, Ergocalciferol, (DRISDOL) 1.25 MG (50000 UNIT) CAPS capsule Take 50,000 Units by mouth once a week. 02/22/22   [provider]      Allergies    Asa [aspirin], Latex, Other, and Avocado    Review of Systems   Review of Systems  Gastrointestinal:  Positive for abdominal pain.    Physical Exam Updated Vital Signs SpO2 99%  Physical Exam Constitutional:      Appearance: She is well-developed. She is obese.  HENT:     Head: Normocephalic.  Cardiovascular:     Rate and Rhythm: Normal rate.  Pulmonary:     Effort: Pulmonary effort is normal.  Abdominal:     Palpations: Abdomen is soft.     Tenderness: There is abdominal tenderness in the right upper quadrant. There is no guarding or rebound.  Musculoskeletal:        General: Normal range of motion.     Cervical back: Normal range of motion and neck supple.  Skin:    General: Skin is warm and dry.  Neurological:     General: No focal deficit present.     Mental Status: She is alert and oriented to person, place, and time.     ED Results / Procedures / Treatments   Labs (all labs ordered are listed, but only abnormal  results are displayed) Labs Reviewed  CBC WITH DIFFERENTIAL/PLATELET  COMPREHENSIVE METABOLIC PANEL  LIPASE, BLOOD    EKG None  Radiology No results found.  Procedures Procedures  {Document cardiac monitor, telemetry assessment procedure when appropriate:1}  Medications Ordered in ED Medications  HYDROmorphone (DILAUDID) injection 0.5 mg (has no administration in time range)    ED Course/ Medical Decision Making/ A&P   {   Click here for ABCD2, HEART and other calculatorsREFRESH Note before signing :1}                              Medical Decision Making Patient with RUQ pain since 11:00 last night after fatty meal. History of gall stones. No fever.   Amount and/or Complexity of Data  Reviewed Labs: ordered.  Risk Prescription drug management.   ***  {Document critical care time when appropriate:1} {Document review of labs and clinical decision tools ie heart score, Chads2Vasc2 etc:1}  {Document your independent review of radiology images, and any outside records:1} {Document your discussion with family members, caretakers, and with consultants:1} {Document social determinants of health affecting pt's care:1} {Document your decision making why or why not admission, treatments were needed:1} Final Clinical Impression(s) / ED Diagnoses Final diagnoses:  None    Rx / DC Orders ED Discharge Orders     None

## 2023-07-16 ENCOUNTER — Telehealth: Payer: Self-pay

## 2023-07-16 NOTE — Transitions of Care (Post Inpatient/ED Visit) (Cosign Needed)
   07/16/2023  Name: Cassandra White MRN: [id number] DOB: Apr 12, 1986  Today's TOC FU Call Status: Today's TOC FU Call Status:: Unsuccessful Call (1st Attempt) Unsuccessful Call (1st Attempt) Date: 07/16/23  Attempted to reach the patient regarding the most recent Inpatient/ED visit.  Follow Up Plan: Additional outreach attempts will be made to reach the patient to complete the Transitions of Care (Post Inpatient/ED visit) call.   Signature Renelda Loma RMA

## 2023-07-19 ENCOUNTER — Telehealth: Payer: Self-pay

## 2023-07-19 NOTE — Transitions of Care (Post Inpatient/ED Visit) (Signed)
   07/19/2023  Name: Cassandra White MRN: [id number] DOB: 07/24/1985  Today's TOC FU Call Status: Today's TOC FU Call Status:: Unsuccessful Call (2nd Attempt) Unsuccessful Call (2nd Attempt) Date: 07/19/23  Attempted to reach the patient regarding the most recent Inpatient/ED visit.  Follow Up Plan: Additional outreach attempts will be made to reach the patient to complete the Transitions of Care (Post Inpatient/ED visit) call.   Signature  [employer], [place]

## 2023-07-20 ENCOUNTER — Inpatient Hospital Stay: Payer: Self-pay | Admitting: Nurse Practitioner

## 2023-11-13 ENCOUNTER — Other Ambulatory Visit: Payer: Self-pay

## 2023-11-13 ENCOUNTER — Emergency Department (HOSPITAL_COMMUNITY)
Admission: EM | Admit: 2023-11-13 | Discharge: 2023-11-13 | Disposition: A | Discharge status: Home / Self Care | Payer: MEDICAID | Attending: Emergency Medicine | Admitting: Emergency Medicine

## 2023-11-13 ENCOUNTER — Encounter (HOSPITAL_COMMUNITY): Payer: Self-pay

## 2023-11-13 DIAGNOSIS — M79604 Pain in right leg: Secondary | ICD-10-CM

## 2023-11-13 DIAGNOSIS — Z91040 Latex allergy status: Secondary | ICD-10-CM | POA: Diagnosis not present

## 2023-11-13 DIAGNOSIS — M79662 Pain in left lower leg: Secondary | ICD-10-CM | POA: Diagnosis present

## 2023-11-13 DIAGNOSIS — M79661 Pain in right lower leg: Secondary | ICD-10-CM | POA: Insufficient documentation

## 2023-11-13 LAB — COMPREHENSIVE METABOLIC PANEL WITH GFR
ALT: 10 U/L (ref 0–44)
AST: 17 U/L (ref 15–41)
Albumin: 3.7 g/dL (ref 3.5–5.0)
Alkaline Phosphatase: 48 U/L (ref 38–126)
Anion gap: 5 (ref 5–15)
BUN: 15 mg/dL (ref 6–20)
CO2: 23 mmol/L (ref 22–32)
Calcium: 9.3 mg/dL (ref 8.9–10.3)
Chloride: 107 mmol/L (ref 98–111)
Creatinine, Ser: 0.97 mg/dL (ref 0.44–1.00)
GFR, Estimated: >60 mL/min (ref >60)
Glucose, Bld: 117 mg/dL — ABNORMAL HIGH (ref 70–99)
Potassium: 3.9 mmol/L (ref 3.5–5.1)
Sodium: 135 mmol/L (ref 135–145)
Total Bilirubin: 0.5 mg/dL (ref 0.0–1.2)
Total Protein: 8 g/dL (ref 6.5–8.1)

## 2023-11-13 LAB — CBC
HCT: 34.4 % — ABNORMAL LOW (ref 36.0–46.0)
Hemoglobin: 11 g/dL — ABNORMAL LOW (ref 12.0–15.0)
MCH: 30.8 pg (ref 26.0–34.0)
MCHC: 32 g/dL (ref 30.0–36.0)
MCV: 96.4 fL (ref 80.0–100.0)
Platelets: 343 10*3/uL (ref 150–400)
RBC: 3.57 MIL/uL — ABNORMAL LOW (ref 3.87–5.11)
RDW: 13.1 % (ref 11.5–15.5)
WBC: 8.9 10*3/uL (ref 4.0–10.5)
nRBC: 0 % (ref 0.0–0.2)

## 2023-11-13 MED ORDER — OXYCODONE-ACETAMINOPHEN 5-325 MG PO TABS: 2.0000 | ORAL_TABLET | Freq: Once | ORAL | Status: DC

## 2023-11-13 MED ORDER — ENOXAPARIN SODIUM 300 MG/3ML IJ SOLN
165.0000 mg | INTRAMUSCULAR | Status: AC
  Administered 2023-11-13: 165 mg via SUBCUTANEOUS
  Filled 2023-11-13: qty 1.65

## 2023-11-13 NOTE — ED Provider Notes (Signed)
 [place] EMERGENCY DEPARTMENT AT [facility] Provider Note   CSN: [id number] Arrival date & time: 11/13/23  2013     History Chief Complaint  Patient presents with   Leg Swelling   Leg Pain    Cassandra White is a 38 y.o. female with history of factor V Leiden and numerous DVTs in the past who presents to the emerged from today with right leg pain.  Patient states that she had a mechanical trip and fall last week.  He states that the pain is subsided since then apart from pain in her right leg.  She states that it feels more swollen, tight, and warm to touch since that injury.  She denies any chest pain or shortness of breath.   Leg Pain      Home Medications Prior to Admission medications   Medication Sig Start Date End Date Taking? Authorizing Provider  benzonatate  (TESSALON ) 200 MG capsule Take 1 capsule (200 mg total) by mouth 3 (three) times daily as needed. 07/04/23   Mayer, Jodi R, NP  ferrous sulfate  (FERROUSUL) 325 (65 FE) MG tablet Take 1 tablet (325 mg total) by mouth every other day. 07/14/21   Abigail Abler, MD  ferrous sulfate  325 (65 FE) MG tablet Take 325 mg by mouth daily with breakfast.    [provider]  hydrALAZINE  (APRESOLINE ) 10 MG tablet Take 1 tablet (10 mg total) by mouth 3 (three) times daily as needed (If blood pressure over 180 and not responding to home medication). 04/10/23   Tegeler, Marine Sia, MD  labetalol  (NORMODYNE ) 300 MG tablet Take 1.5 tablets (450 mg total) by mouth 2 (two) times daily. 07/28/21   Rasch, Bridgette Campus I, NP  naproxen  (NAPROSYN ) 500 MG tablet Take 1 tablet (500 mg total) by mouth 2 (two) times daily. Patient not taking: Reported on 09/20/2022 03/22/22   Edson Graces, MD  ondansetron  (ZOFRAN -ODT) 4 MG disintegrating tablet Take 1 tablet (4 mg total) by mouth every 8 (eight) hours as needed for nausea or vomiting. 07/15/23   Curatolo, Adam, DO  oxyCODONE  (OXY IR/ROXICODONE ) 5 MG immediate release tablet Take 1  tablet (5 mg total) by mouth every 6 (six) hours as needed for up to 10 doses for breakthrough pain. 07/15/23   Lowery Rue, DO  Prenatal MV & Min w/FA-DHA (PRENATAL GUMMIES) 0.18-25 MG CHEW Chew by mouth. Patient not taking: Reported on 07/14/2021    [provider]  Semaglutide ,0.25 or 0.5MG /DOS, (OZEMPIC , 0.25 OR 0.5 MG/DOSE,) 2 MG/3ML SOPN Inject 0.25 mg into the skin once a week. Patient not taking: Reported on 09/20/2022 04/14/22   Nichols, Tonya S, NP  Vitamin D, Ergocalciferol, (DRISDOL) 1.25 MG (50000 UNIT) CAPS capsule Take 50,000 Units by mouth once a week. 02/22/22   [provider]      Allergies    Asa [aspirin], Latex, Other, and Avocado    Review of Systems   Review of Systems  All other systems reviewed and are negative.   Physical Exam Updated Vital Signs BP (!) 154/94   Pulse 87   Temp 98.5 F (36.9 C) (Oral)   Resp 16   SpO2 100%  Physical Exam Vitals and nursing note reviewed.  Constitutional:      General: She is not in acute distress.    Appearance: Normal appearance.  HENT:     Head: Normocephalic and atraumatic.  Eyes:     General:        Right eye: No discharge.  Left eye: No discharge.  Cardiovascular:     Comments: Regular rate and rhythm.  S1/S2 are distinct without any evidence of murmur, rubs, or gallops.  Radial pulses are 2+ bilaterally.  Dorsalis pedis pulses are 2+ bilaterally.  No evidence of pedal edema. Pulmonary:     Comments: Clear to auscultation bilaterally.  Normal effort.  No respiratory distress.  No evidence of wheezes, rales, or rhonchi heard throughout. Abdominal:     General: Abdomen is flat. Bowel sounds are normal. There is no distension.     Tenderness: There is no abdominal tenderness. There is no guarding or rebound.  Musculoskeletal:        General: Normal range of motion.     Cervical back: Neck supple.     Comments: 2+ dorsalis pedis pulses felt in the bilateral lower extremities.  There is  right lower extremity tenderness.  Area is not significant warm to palpation.  No surrounding erythema.  Skin:    General: Skin is warm and dry.     Findings: No rash.  Neurological:     General: No focal deficit present.     Mental Status: She is alert.  Psychiatric:        Mood and Affect: Mood normal.        Behavior: Behavior normal.     ED Results / Procedures / Treatments   Labs (all labs ordered are listed, but only abnormal results are displayed) Labs Reviewed  COMPREHENSIVE METABOLIC PANEL WITH GFR - Abnormal; Notable for the following components:      Result Value   Glucose, Bld 117 (*)    All other components within normal limits  CBC - Abnormal; Notable for the following components:   RBC 3.57 (*)    Hemoglobin 11.0 (*)    HCT 34.4 (*)    All other components within normal limits    EKG None  Radiology No results found.  Procedures Procedures    Medications Ordered in ED Medications - No data to display  ED Course/ Medical Decision Making/ A&P   {   Click here for ABCD2, HEART and other calculators  Medical Decision Making Cassandra White is a 38 y.o. female patient who presents to the emergency department today for further evaluation of bilateral leg pain worse on the right.  Given the patient's factor V Leiden history and multiple blood clots there is certainly a concern for DVTs today.  Unfortunately, it is after hours and vascular ultrasound is no longer here today.  I will give the patient a dose of Lovenox  here and use the DVT order set and have her present tomorrow for the ultrasound of the bilateral lower extremities.  Patient is overall well-appearing.  Vital signs are normal.  Low suspicion for pulmonary embolism at this time.  Patient agreeable with plan.  Strict turn precautions were discussed.  She is safe for discharge.   Amount and/or Complexity of Data Reviewed Labs: ordered.   Final Clinical Impression(s) / ED Diagnoses Final  diagnoses:  Pain in both lower extremities    Rx / DC Orders ED Discharge Orders          Ordered    LE Venous       Comments: IMPORTANT PATIENT INSTRUCTIONS: You have been scheduled for an Outpatient Vascular Study at [facility]  If tomorrow is a Saturday, Sunday or holiday, please go to the [facility] Emergency Department Registration Desk at 11 am tomorrow morning and tell them you  are there for a vascular study.  If tomorrow is a weekday (Monday-Friday), please go to the [facility] (address [address] [place]) at 8 am and report to the 4th floor registration Zone A.  Inform registration that you are there for a vascular study.   11/13/23 2234              Darletta Ehrich, PA-C 11/13/23 2249    Ninetta Basket, MD 11/15/23 [phone]

## 2023-11-13 NOTE — Discharge Instructions (Addendum)
 Follow the instructions on your discharge paperwork to go get your ultrasounds of your legs tomorrow.  Please return to the emergency department for any worsening symptoms.

## 2023-11-13 NOTE — ED Triage Notes (Signed)
 Pt came in for right leg pain. Pt stated she fell last week and injured both her legs and ribs. However, all injury pain's subsided but the right leg. Pt c/o right leg swelling and she stated it's "hot to the touch".   Hx blood clots in both legs

## 2023-11-14 ENCOUNTER — Ambulatory Visit (HOSPITAL_COMMUNITY)
Admission: RE | Admit: 2023-11-14 | Discharge: 2023-11-14 | Disposition: A | Discharge status: Home / Self Care | Payer: Self-pay | Source: Ambulatory Visit | Attending: Emergency Medicine | Admitting: Emergency Medicine

## 2023-11-14 DIAGNOSIS — Z86718 Personal history of other venous thrombosis and embolism: Secondary | ICD-10-CM | POA: Diagnosis not present

## 2023-11-14 DIAGNOSIS — M79606 Pain in leg, unspecified: Secondary | ICD-10-CM | POA: Diagnosis not present

## 2023-11-14 DIAGNOSIS — D6851 Activated protein C resistance: Secondary | ICD-10-CM | POA: Diagnosis present

## 2023-11-16 ENCOUNTER — Telehealth: Payer: Self-pay

## 2023-11-16 NOTE — Transitions of Care (Post Inpatient/ED Visit) (Signed)
   11/16/2023  Name: Cassandra White MRN: [id number] DOB: 01-Oct-1985  Today's TOC FU Call Status: Today's TOC FU Call Status:: Unsuccessful Call (1st Attempt) Unsuccessful Call (1st Attempt) Date: 11/16/23  Attempted to reach the patient regarding the most recent Inpatient/ED visit.  Follow Up Plan: Additional outreach attempts will be made to reach the patient to complete the Transitions of Care (Post Inpatient/ED visit) call.   Signature  [employer], [place]

## 2023-11-16 NOTE — Transitions of Care (Post Inpatient/ED Visit) (Signed)
   11/16/2023  Name: Cassandra White MRN: [id number] DOB: 1986-05-26  Today's TOC FU Call Status: Today's TOC FU Call Status:: Unsuccessful Call (2nd Attempt) Unsuccessful Call (2nd Attempt) Date: 11/16/23  Attempted to reach the patient regarding the most recent Inpatient/ED visit.  Follow Up Plan: Additional outreach attempts will be made to reach the patient to complete the Transitions of Care (Post Inpatient/ED visit) call.   Signature  [employer], [place]

## 2023-11-16 NOTE — Telephone Encounter (Signed)
 Copied from CRM [id number]. Topic: General - Other >> Nov 16, 2023 11:44 AM DeAngela L wrote: Reason for CRM: Patient called for [employer]	[place] num [phone] [facility])

## 2023-11-23 ENCOUNTER — Ambulatory Visit (INDEPENDENT_AMBULATORY_CARE_PROVIDER_SITE_OTHER): Payer: Self-pay | Admitting: Nurse Practitioner

## 2023-11-23 ENCOUNTER — Encounter: Payer: Self-pay | Admitting: Nurse Practitioner

## 2023-11-23 VITALS — BP 157/103 | HR 80 | Temp 98.1°F | Resp 16 | Ht 69.0 in | Wt 385.0 lb

## 2023-11-23 DIAGNOSIS — L03115 Cellulitis of right lower limb: Secondary | ICD-10-CM | POA: Diagnosis not present

## 2023-11-23 DIAGNOSIS — R600 Localized edema: Secondary | ICD-10-CM

## 2023-11-23 DIAGNOSIS — I10 Essential (primary) hypertension: Secondary | ICD-10-CM | POA: Diagnosis not present

## 2023-11-23 MED ORDER — CEPHALEXIN 500 MG PO CAPS: 500.0000 mg | ORAL_CAPSULE | Freq: Three times a day (TID) | ORAL | 2 refills | Status: DC

## 2023-11-23 MED ORDER — HYDROCHLOROTHIAZIDE 25 MG PO TABS: 25.0000 mg | ORAL_TABLET | Freq: Every day | ORAL | 3 refills | Status: AC

## 2023-11-23 MED ORDER — LABETALOL HCL 300 MG PO TABS: 450.0000 mg | ORAL_TABLET | Freq: Two times a day (BID) | ORAL | 2 refills | Status: DC

## 2023-11-23 NOTE — Progress Notes (Signed)
 Subjective   Patient ID: Cassandra White, female    DOB: Apr 24, 1986, 38 y.o.   MRN: [id number]  Chief Complaint  Patient presents with   Medical Management of Chronic Issues    Referring provider: [name], NP  Cassandra White is a 38 y.o. female with Past Medical History: No date: Anemia No date: Asthma No date: Breast disorder     Comment:  lump in right breast 06/02/2021: Chronic hypertension during pregnancy, antepartum 2019: DVT (deep venous thrombosis) (HCC) No date: Edema of left lower extremity     Comment:  Since age 14  No date: Fibrocystic breast changes     Comment:  lump in right breast U/S 11/22/11: Multiple complicated               cysts with mural nodularity as discussed above. Given the              appearance, I recommend tissue sampling of the most               complicated appearing lesion. If this demonstrates benign              findings, recommend following the remaining lesions.               Ultrasound- guided core biopsy was discussed with the               patient. This will be scheduled per patient preference.                BI-RADS CATEGO No date: History of blood clots     Comment:  right leg, left leg, right forearm No date: Obese No date: PCOS (polycystic ovarian syndrome)   HPI  Patient presents today for an ED follow-up.  She was seen in the ED on 11/13/2023 for hematoma to right lower leg after a fall.  She did have ultrasound that was negative for DVT.  She continues to have swelling and redness with warmth to the touch around the hematoma site to her right lower leg.  Patient has been having issues with elevated blood pressure and bilateral lower extremity edema also.  We will add HCTZ.  Patient is currently taking labetalol  twice a day.  We will place a referral for pharmacy for medication management.  We will place a referral to vascular to evaluate peripheral edema.  Patient was found to be slightly anemic upon labs in ED.   Samples of iron with folic acid were given in office today.  Denies f/c/s, n/v/d, hemoptysis, PND, leg swelling Denies chest pain or edema   Restart iron and folic acid - samples given     Allergies  Allergen Reactions   Asa [Aspirin]     Stomach ulcers   Latex    Other     cucumbers   Avocado Nausea Only    Immunization History  Administered Date(s) Administered   Influenza,inj,Quad PF,6+ Mos 05/02/2021, 04/14/2022   Influenza-Unspecified 05/02/2021   Tdap 05/02/2021   Unspecified SARS-COV-2 Vaccination 10/08/2020    Tobacco History: Social History   Tobacco Use  Smoking Status Never  Smokeless Tobacco Never   Counseling given: Not Answered   Outpatient Encounter Medications as of 11/23/2023  Medication Sig   cephALEXin  (KEFLEX ) 500 MG capsule Take 1 capsule (500 mg total) by mouth 3 (three) times daily.   hydrochlorothiazide (HYDRODIURIL) 25 MG tablet Take 1 tablet (25 mg total) by mouth daily.   naproxen  (NAPROSYN ) 500  MG tablet Take 1 tablet (500 mg total) by mouth 2 (two) times daily.   ondansetron  (ZOFRAN -ODT) 4 MG disintegrating tablet Take 1 tablet (4 mg total) by mouth every 8 (eight) hours as needed for nausea or vomiting.   oxyCODONE  (OXY IR/ROXICODONE ) 5 MG immediate release tablet Take 1 tablet (5 mg total) by mouth every 6 (six) hours as needed for up to 10 doses for breakthrough pain.   Vitamin D, Ergocalciferol, (DRISDOL) 1.25 MG (50000 UNIT) CAPS capsule Take 50,000 Units by mouth once a week.   [DISCONTINUED] hydrALAZINE  (APRESOLINE ) 10 MG tablet Take 1 tablet (10 mg total) by mouth 3 (three) times daily as needed (If blood pressure over 180 and not responding to home medication).   [DISCONTINUED] labetalol  (NORMODYNE ) 300 MG tablet Take 1.5 tablets (450 mg total) by mouth 2 (two) times daily.   ferrous sulfate  (FERROUSUL) 325 (65 FE) MG tablet Take 1 tablet (325 mg total) by mouth every other day.   ferrous sulfate  325 (65 FE) MG tablet Take 325 mg by  mouth daily with breakfast.   labetalol  (NORMODYNE ) 300 MG tablet Take 1.5 tablets (450 mg total) by mouth 2 (two) times daily.   Prenatal MV & Min w/FA-DHA (PRENATAL GUMMIES) 0.18-25 MG CHEW Chew by mouth. (Patient not taking: Reported on 07/14/2021)   Semaglutide ,0.25 or 0.5MG /DOS, (OZEMPIC , 0.25 OR 0.5 MG/DOSE,) 2 MG/3ML SOPN Inject 0.25 mg into the skin once a week. (Patient not taking: Reported on 09/20/2022)   [DISCONTINUED] benzonatate  (TESSALON ) 200 MG capsule Take 1 capsule (200 mg total) by mouth 3 (three) times daily as needed.   No facility-administered encounter medications on file as of 11/23/2023.    Review of Systems  Review of Systems  Constitutional: Negative.   HENT: Negative.    Cardiovascular: Negative.   Gastrointestinal: Negative.   Allergic/Immunologic: Negative.   Neurological: Negative.   Psychiatric/Behavioral: Negative.       Objective:   BP (!) 157/103   Pulse 80   Temp 98.1 F (36.7 C) (Oral)   Resp 16   Ht 5\' 9"  (1.753 m)   Wt (!) 385 lb (174.6 kg)   SpO2 98%   BMI 56.85 kg/m   Wt Readings from Last 5 Encounters:  11/23/23 (!) 385 lb (174.6 kg)  11/13/23 (!) 360 lb (163.3 kg)  07/15/23 (!) 369 lb (167.4 kg)  09/15/22 (!) 360 lb (163.3 kg)  04/14/22 (!) 371 lb (168.3 kg)     Physical Exam Vitals and nursing note reviewed.  Constitutional:      General: She is not in acute distress.    Appearance: She is well-developed.  Cardiovascular:     Rate and Rhythm: Normal rate and regular rhythm.  Pulmonary:     Effort: Pulmonary effort is normal.     Breath sounds: Normal breath sounds.  Neurological:     Mental Status: She is alert and oriented to person, place, and time.       Assessment & Plan:   Primary hypertension -     hydroCHLOROthiazide; Take 1 tablet (25 mg total) by mouth daily.  Dispense: 90 tablet; Refill: 3 -     AMB Referral VBCI Care Management  Cellulitis of right lower extremity -     Cephalexin ; Take 1 capsule (500  mg total) by mouth 3 (three) times daily.  Dispense: 30 capsule; Refill: 2  Peripheral edema -     Ambulatory referral to Vascular Surgery  Other orders -     Labetalol  HCl;  Take 1.5 tablets (450 mg total) by mouth 2 (two) times daily.  Dispense: 90 tablet; Refill: 2     Return in about 4 weeks (around 12/21/2023) for anemia and hypertension.     [name], NP 11/23/2023

## 2023-11-23 NOTE — Patient Instructions (Signed)
 1. Primary hypertension (Primary)  - hydrochlorothiazide (HYDRODIURIL) 25 MG tablet; Take 1 tablet (25 mg total) by mouth daily.  Dispense: 90 tablet; Refill: 3 - AMB Referral VBCI Care Management  2. Cellulitis of right lower extremity  - cephALEXin  (KEFLEX ) 500 MG capsule; Take 1 capsule (500 mg total) by mouth 3 (three) times daily.  Dispense: 30 capsule; Refill: 2  3. Peripheral edema  - Ambulatory referral to Vascular Surgery

## 2023-11-27 ENCOUNTER — Telehealth: Payer: Self-pay | Admitting: *Deleted

## 2023-11-27 NOTE — Progress Notes (Signed)
 Care Guide Pharmacy Note  11/27/2023 Name: Cassandra White MRN: [id number] DOB: September 14, 1985  Referred By: [name], NP Reason for referral: Complex Care Management (Initial outreach to schedule referral with Pharmacy )   Cassandra White is a 38 y.o. year old female who is a primary care patient of [name], NP.  Cassandra White was referred to the pharmacist for assistance related to: HTN  Successful contact was made with the patient to discuss pharmacy services including being ready for the pharmacist to call at least 5 minutes before the scheduled appointment time and to have medication bottles and any blood pressure readings ready for review. The patient agreed to meet with the pharmacist via telephone visit on (date/time).7/28 at 1:00 PM  Cassandra White  [facility], [facility] Guide  Direct Dial: [phone]  Fax [phone]

## 2023-11-29 ENCOUNTER — Other Ambulatory Visit: Payer: Self-pay

## 2023-11-29 DIAGNOSIS — I10 Essential (primary) hypertension: Secondary | ICD-10-CM

## 2023-11-29 MED ORDER — AMLODIPINE BESYLATE 5 MG PO TABS: 5.0000 mg | ORAL_TABLET | Freq: Every day | ORAL | 5 refills | Status: DC

## 2023-11-29 MED ORDER — LABETALOL HCL 300 MG PO TABS: 300.0000 mg | ORAL_TABLET | Freq: Two times a day (BID) | ORAL | Status: DC

## 2023-11-29 NOTE — Progress Notes (Signed)
 11/29/2023 Name: Cassandra White MRN: [id number] DOB: [birth year]  Chief Complaint  Patient presents with   Hypertension    Cassandra White is a 38 y.o. year old female who presented for a telephone visit.   They were referred to the pharmacist by their PCP for assistance in managing hypertension. PMH includes HTN, hx of DVT (2018), BMI > 50, anemia.  Subjective: Patient reports doing ok today - BP has improved since starting hydrochlorothiazide . But she reports that she gets headaches with labetalol . She took labetalol  about 2 years ago when she was pregnant with her daughter and noted the same side effect then. She is not planning on having any more pregnancies at this time.   Care Team: Primary Care Provider: [name], NP ; Next Scheduled Visit: 01/07/24   Medication Access/Adherence  Current Pharmacy:  [facility][id number] Jonette Nestle, [place] - [address] AT [facility] OF [facility] [address] [place] [address] Phone: [phone] Fax: [phone]   Patient reports affordability concerns with their medications: No  Patient reports access/transportation concerns to their pharmacy: No  Patient reports adherence concerns with their medications:  No     Hypertension:  Current medications: hydrochlorothiazide  25 mg daily, labetalol  450 mg BID (takes 1.5x tabs of 300 mg tablet BID) Medications previously tried:   Patient has a validated, automated, upper arm home BP cuff - checking once daily in the morning. Takes medication as soon as she wakes up, then monitors ~40 min later. Current blood pressure readings readings: 180/101, 176/124 over the past 2 days  Patient denies hypotensive s/sx including dizziness, lightheadedness.  Patient denies hypertensive symptoms including headache (no severe HA since starting hydrochlorothiazide ), chest pain, shortness of breath. Reports that she is having HA since starting labetalol , when BP is really  high she gets a raging HA with blurry vision.    Objective:  BP Readings from Last 3 Encounters:  11/23/23 (!) 157/103  11/13/23 (!) 154/94  07/15/23 (!) 143/89    Lab Results  Component Value Date   HGBA1C 5.2 05/02/2021    Lab Results  Component Value Date   CREATININE 0.97 11/13/2023   BUN 15 11/13/2023   NA 135 11/13/2023   K 3.9 11/13/2023   CL 107 11/13/2023   CO2 23 11/13/2023    Lab Results  Component Value Date   LDLDIRECT 65 12/12/2011    Medications Reviewed Today   Medications were not reviewed in this encounter       Assessment/Plan:   Hypertension: - Currently uncontrolled with last clinic BP of 157/103 mmHg above goal < 130/80 mmHg. Home blood pressures indicative of hypertensive urgency, though patient states this is improved from her baseline and she denies s/sx of hypertensive emergency. Agree with recent initiation of hydrochlorothiazide . Patient reports that she is tolerating well. She would like to be on another medication other than labetalol  due to headaches. Instructed her that we will plan to titrate her off labetalol  and start CCB, amlodipine. If BP continues to be uncontrolled, she is a good candidate for addition of an ARB since she is not planning additional pregnancies. Will need to emphasize importance of birth control.  - Reviewed long term cardiovascular and renal outcomes of uncontrolled blood pressure - Reviewed appropriate blood pressure monitoring technique and reviewed goal blood pressure. Recommended to check home blood pressure and heart rate once daily at least 1 hour after taking medications.  - Recommend to continue hydrochlorothiazide   25 mg daily - Recommend to decrease labetalol  to 1 tab (300 mg) BID due to headaches - Recommend to start amlodipine 5 mg daily. Likely will need to titrate at follow-up. - Patient will need repeat BMP at next PCP appt in July after starting hydrochlorothiazide .    Follow Up Plan: PCP  scheduled 01/07/24, Pharmacist telephone scheduled 01/14/24 - but will set reminder to outreach next week.   Arthea Larsson, PharmD PGY1 Pharmacy Resident

## 2023-12-06 ENCOUNTER — Telehealth: Payer: Self-pay

## 2023-12-06 NOTE — Telephone Encounter (Signed)
 Attempted to outreach patient to follow-up on blood pressure after changes made at pharmacy telephone appt on 11/29/23. Left VM with call back information.   Pharmacy telephone appt is scheduled for 01/14/24.   Arthea Larsson, PharmD PGY1 Pharmacy Resident

## 2023-12-17 ENCOUNTER — Other Ambulatory Visit: Payer: Self-pay

## 2023-12-17 DIAGNOSIS — I872 Venous insufficiency (chronic) (peripheral): Secondary | ICD-10-CM

## 2023-12-28 ENCOUNTER — Ambulatory Visit (HOSPITAL_COMMUNITY)

## 2024-01-03 ENCOUNTER — Ambulatory Visit: Attending: Nurse Practitioner

## 2024-01-04 ENCOUNTER — Ambulatory Visit: Payer: Self-pay | Admitting: Nurse Practitioner

## 2024-01-07 ENCOUNTER — Encounter: Payer: Self-pay | Admitting: Nurse Practitioner

## 2024-01-07 ENCOUNTER — Ambulatory Visit (INDEPENDENT_AMBULATORY_CARE_PROVIDER_SITE_OTHER): Payer: Self-pay | Admitting: Nurse Practitioner

## 2024-01-07 VITALS — BP 149/93 | HR 93 | Temp 98.5°F | Wt 391.4 lb

## 2024-01-07 DIAGNOSIS — R7303 Prediabetes: Secondary | ICD-10-CM | POA: Diagnosis not present

## 2024-01-07 DIAGNOSIS — Z1329 Encounter for screening for other suspected endocrine disorder: Secondary | ICD-10-CM | POA: Diagnosis not present

## 2024-01-07 DIAGNOSIS — D649 Anemia, unspecified: Secondary | ICD-10-CM | POA: Diagnosis not present

## 2024-01-07 LAB — POCT GLYCOSYLATED HEMOGLOBIN (HGB A1C): Hemoglobin A1C: 5.5 % (ref 4.0–5.6)

## 2024-01-07 NOTE — Progress Notes (Signed)
 Subjective   Patient ID: Cassandra White, female    DOB: May 09, 1986, 38 y.o.   MRN: [id number]  Chief Complaint  Patient presents with   Medical Management of Chronic Issues    Referring provider: Oley Bascom RAMAN, NP  Cassandra White is a 38 y.o. female with Past Medical History: No date: Anemia No date: Asthma 06/2021: Blood transfusion without reported diagnosis No date: Breast disorder     Comment:  lump in right breast 06/02/2021: Chronic hypertension during pregnancy, antepartum 2019: DVT (deep venous thrombosis) (HCC) No date: Edema of left lower extremity     Comment:  Since age 54  No date: Fibrocystic breast changes     Comment:  lump in right breast U/S 11/22/11: Multiple complicated               cysts with mural nodularity as discussed above. Given the              appearance, I recommend tissue sampling of the most               complicated appearing lesion. If this demonstrates benign              findings, recommend following the remaining lesions.               Ultrasound- guided core biopsy was discussed with the               patient. This will be scheduled per patient preference.                BI-RADS CATEGO No date: History of blood clots     Comment:  right leg, left leg, right forearm No date: Obese No date: PCOS (polycystic ovarian syndrome) No date: Sleep apnea   HPI  Patient presents today for follow up.  A1c in office today is 5.5.  We will also need to check thyroid  today as well.  Overall she is doing well does not need refills at this time. Denies f/c/s, n/v/d, hemoptysis, PND, leg swelling Denies chest pain or edema    Allergies  Allergen Reactions   Asa [Aspirin]     Stomach ulcers   Latex    Other     cucumbers   Avocado Nausea Only    Immunization History  Administered Date(s) Administered   Influenza,inj,Quad PF,6+ Mos 05/02/2021, 04/14/2022   Influenza-Unspecified 05/02/2021   Tdap 05/02/2021   Unspecified SARS-COV-2  Vaccination 10/08/2020    Tobacco History: Social History   Tobacco Use  Smoking Status Never  Smokeless Tobacco Never   Counseling given: Not Answered   Outpatient Encounter Medications as of 01/07/2024  Medication Sig   amLODipine  (NORVASC ) 5 MG tablet Take 1 tablet (5 mg total) by mouth daily.   cephALEXin  (KEFLEX ) 500 MG capsule Take 1 capsule (500 mg total) by mouth 3 (three) times daily.   ferrous sulfate  (FERROUSUL) 325 (65 FE) MG tablet Take 1 tablet (325 mg total) by mouth every other day.   ferrous sulfate  325 (65 FE) MG tablet Take 325 mg by mouth daily with breakfast.   hydrochlorothiazide  (HYDRODIURIL ) 25 MG tablet Take 1 tablet (25 mg total) by mouth daily.   labetalol  (NORMODYNE ) 300 MG tablet Take 1 tablet (300 mg total) by mouth 2 (two) times daily.   naproxen  (NAPROSYN ) 500 MG tablet Take 1 tablet (500 mg total) by mouth 2 (two) times daily.   ondansetron  (ZOFRAN -ODT) 4 MG disintegrating tablet Take 1 tablet (4  mg total) by mouth every 8 (eight) hours as needed for nausea or vomiting.   oxyCODONE  (OXY IR/ROXICODONE ) 5 MG immediate release tablet Take 1 tablet (5 mg total) by mouth every 6 (six) hours as needed for up to 10 doses for breakthrough pain.   Vitamin D, Ergocalciferol, (DRISDOL) 1.25 MG (50000 UNIT) CAPS capsule Take 50,000 Units by mouth once a week.   Prenatal MV & Min w/FA-DHA (PRENATAL GUMMIES) 0.18-25 MG CHEW Chew by mouth. (Patient not taking: Reported on 07/14/2021)   Semaglutide ,0.25 or 0.5MG /DOS, (OZEMPIC , 0.25 OR 0.5 MG/DOSE,) 2 MG/3ML SOPN Inject 0.25 mg into the skin once a week. (Patient not taking: Reported on 09/20/2022)   No facility-administered encounter medications on file as of 01/07/2024.    Review of Systems  Review of Systems  Constitutional: Negative.   HENT: Negative.    Cardiovascular: Negative.   Gastrointestinal: Negative.   Allergic/Immunologic: Negative.   Neurological: Negative.   Psychiatric/Behavioral: Negative.        Objective:   BP (!) 149/93   Pulse 93   Temp 98.5 F (36.9 C) (Oral)   Wt (!) 391 lb 6.4 oz (177.5 kg)   SpO2 99%   BMI 57.80 kg/m   Wt Readings from Last 5 Encounters:  01/07/24 (!) 391 lb 6.4 oz (177.5 kg)  11/23/23 (!) 385 lb (174.6 kg)  11/13/23 (!) 360 lb (163.3 kg)  07/15/23 (!) 369 lb (167.4 kg)  09/15/22 (!) 360 lb (163.3 kg)     Physical Exam Vitals and nursing note reviewed.  Constitutional:      General: She is not in acute distress.    Appearance: She is well-developed.  Cardiovascular:     Rate and Rhythm: Normal rate and regular rhythm.  Pulmonary:     Effort: Pulmonary effort is normal.     Breath sounds: Normal breath sounds.  Neurological:     Mental Status: She is alert and oriented to person, place, and time.       Assessment & Plan:   Prediabetes -     POCT glycosylated hemoglobin (Hb A1C) -     Microalbumin / creatinine urine ratio  Thyroid  disorder screen -     Thyroid  Panel With TSH  Anemia, unspecified type -     CBC -     Comprehensive metabolic panel with GFR     Return in about 6 months (around 07/09/2024).   Bascom GORMAN Borer, NP 01/07/2024

## 2024-01-08 LAB — MICROALBUMIN / CREATININE URINE RATIO
Creatinine, Urine: 260.3 mg/dL
Microalb/Creat Ratio: 8 mg/g{creat} (ref 0–29)
Microalbumin, Urine: 20.5 ug/mL

## 2024-01-08 LAB — THYROID PANEL WITH TSH
Free Thyroxine Index: 2 (ref 1.2–4.9)
T3 Uptake Ratio: 25 % (ref 24–39)
T4, Total: 7.9 ug/dL (ref 4.5–12.0)
TSH: 0.922 u[IU]/mL (ref 0.450–4.500)

## 2024-01-08 LAB — CBC
Hematocrit: 32.3 % — ABNORMAL LOW (ref 34.0–46.6)
Hemoglobin: 10.5 g/dL — ABNORMAL LOW (ref 11.1–15.9)
MCH: 30.2 pg (ref 26.6–33.0)
MCHC: 32.5 g/dL (ref 31.5–35.7)
MCV: 93 fL (ref 79–97)
Platelets: 374 x10E3/uL (ref 150–450)
RBC: 3.48 x10E6/uL — ABNORMAL LOW (ref 3.77–5.28)
RDW: 13.4 % (ref 11.7–15.4)
WBC: 9.5 x10E3/uL (ref 3.4–10.8)

## 2024-01-08 LAB — COMPREHENSIVE METABOLIC PANEL WITH GFR
ALT: 9 IU/L (ref 0–32)
AST: 13 IU/L (ref 0–40)
Albumin: 4 g/dL (ref 3.9–4.9)
Alkaline Phosphatase: 58 IU/L (ref 44–121)
BUN/Creatinine Ratio: 13 (ref 9–23)
BUN: 13 mg/dL (ref 6–20)
Bilirubin Total: 0.3 mg/dL (ref 0.0–1.2)
CO2: 22 mmol/L (ref 20–29)
Calcium: 9.5 mg/dL (ref 8.7–10.2)
Chloride: 102 mmol/L (ref 96–106)
Creatinine, Ser: 1.04 mg/dL — ABNORMAL HIGH (ref 0.57–1.00)
Globulin, Total: 3.2 g/dL (ref 1.5–4.5)
Glucose: 115 mg/dL — ABNORMAL HIGH (ref 70–99)
Potassium: 3.9 mmol/L (ref 3.5–5.2)
Sodium: 139 mmol/L (ref 134–144)
Total Protein: 7.2 g/dL (ref 6.0–8.5)
eGFR: 71 mL/min/1.73 (ref >59)

## 2024-01-09 ENCOUNTER — Ambulatory Visit: Payer: Self-pay | Admitting: Nurse Practitioner

## 2024-01-14 ENCOUNTER — Other Ambulatory Visit: Payer: Self-pay

## 2024-01-14 DIAGNOSIS — O10919 Unspecified pre-existing hypertension complicating pregnancy, unspecified trimester: Secondary | ICD-10-CM

## 2024-01-14 MED ORDER — AMLODIPINE BESYLATE 10 MG PO TABS: 10.0000 mg | ORAL_TABLET | Freq: Every day | ORAL | 3 refills | Status: AC

## 2024-01-14 NOTE — Progress Notes (Signed)
 01/14/2024 Name: Cassandra White MRN: [id number] DOB: [birth year]  Chief Complaint  Patient presents with   Hypertension    Cassandra White is a 38 y.o. year old female who presented for a telephone visit.   They were referred to the pharmacist by their PCP for assistance in managing hypertension. PMH includes HTN, hx of DVT (2018), BMI > 50, anemia.  Subjective: Patient was last engaged by pharmacy via telephone on 11/29/23. She was instructed to start amlodipine  for elevated BP, and decrease labetelol due to side effect of HA. Patient was last seen by PCP, Cassandra Borer, NP on 01/07/24. BP had improved since starting amlodipine  to ~149/90s mmHg. No medication changes were made. She previously reported that she is not planning on having any more pregnancies at this time.   Today, patient reports doing well. She reports that she did start amlodipine  after our last visit and her BP have improved. She reports that she stopped taking labetalol  because her headaches worsened. After stopping labetalol , headaches resolved. She was also recently diagnosed with ADHD. She started seeing a psychiatris, Dr. Laurey White, who started her on gaunfacine (for ADHD) and clonidine 0.1 mg at bedtime (for sleep). She reports that neither of these medications are helping her very much. She has follow-up scheduled with him soon.   Care Team: Primary Care Provider: Borer Cassandra RAMAN, NP ; Next Scheduled Visit: 07/11/24 Vascular: Cassandra Serve, MD, Scheduled 04/11/24   Medication Access/Adherence  Current Pharmacy:  [facility] #[id number] [place] MORITA, [place] - [address] AT [facility] OF [facility] [address] [place] [address] Phone: [phone] Fax: [phone]   Patient reports affordability concerns with their medications: No  Patient reports access/transportation concerns to their pharmacy: No  Patient reports adherence concerns with their medications:  No      Hypertension:  Current medications: hydrochlorothiazide  25 mg daily, labetalol  300 mg BID (reports that she stopped taking after last appt), amlodipine  5 mg daily, clondine 0.1 mg at bedtime (prescribed by psychiatry for sleep) Medications previously tried: labetalol  (headaches)  She was recently diagnosed with ADHD - started guanfacine and clonidine (sleep). Is seeing Dr. Laurey White (psychiatry)  Patient has a validated, automated, upper arm home BP cuff - checking once daily in the morning. Takes medication as soon as she wakes up, then monitors ~40 min later. Current blood pressure readings readings: 140/90, 155/81 (highest)  Patient denies hypotensive s/sx including dizziness, lightheadedness.  Patient denies hypertensive symptoms including headache (no severe HA since starting hydrochlorothiazide ), chest pain, shortness of breath.    Objective:  BP Readings from Last 3 Encounters:  01/07/24 (!) 149/93  11/23/23 (!) 157/103  11/13/23 (!) 154/94    Lab Results  Component Value Date   HGBA1C 5.5 01/07/2024    Lab Results  Component Value Date   CREATININE 1.04 (H) 01/07/2024   BUN 13 01/07/2024   NA 139 01/07/2024   K 3.9 01/07/2024   CL 102 01/07/2024   CO2 22 01/07/2024    Lab Results  Component Value Date   LDLDIRECT 65 12/12/2011    Medications Reviewed Today   Medications were not reviewed in this encounter       Assessment/Plan:   Hypertension: - Currently uncontrolled with last clinic BP of 149/93 mmHg above goal < 130/80 mmHg. Home blood pressures stable ~140/90 mmHg. She is tolerating her current regimen well. Potassium WNL after initiation of hydrochlorothiazide . Appropriate to increase amlodipine  to 10 mg daily  today. Advised patient to monitor for worsened LEE. If BP continues to be uncontrolled, she is a good candidate for addition of an ARB since she is not planning additional pregnancies. Will need to emphasize importance of birth control.   - Reviewed long term cardiovascular and renal outcomes of uncontrolled blood pressure - Reviewed appropriate blood pressure monitoring technique and reviewed goal blood pressure. Recommended to check home blood pressure and heart rate once daily at least 1 hour after taking medications.  - Recommend to continue hydrochlorothiazide  25 mg daily - Recommend to increase amlodipine  to 10 mg daily.    Follow Up Plan: Pharmacist telephone 02/26/24, PCP 07/11/24    Cassandra White, PharmD [facility] [phone]

## 2024-02-26 ENCOUNTER — Other Ambulatory Visit: Payer: Self-pay

## 2024-02-26 DIAGNOSIS — O10919 Unspecified pre-existing hypertension complicating pregnancy, unspecified trimester: Secondary | ICD-10-CM

## 2024-02-26 DIAGNOSIS — I10 Essential (primary) hypertension: Secondary | ICD-10-CM

## 2024-02-26 NOTE — Progress Notes (Signed)
 02/26/2024 Name: Cassandra White MRN: [id number] DOB: September 16, 1985  Chief Complaint  Patient presents with   Hypertension    Cassandra White is a 38 y.o. year old female who presented for a telephone visit.   They were referred to the pharmacist by their PCP for assistance in managing hypertension. PMH includes HTN, hx of DVT (2018), BMI > 50, anemia.  Subjective: Patient was  engaged by pharmacy via telephone on 11/29/23. She was instructed to start amlodipine  for elevated BP, and decrease labetelol due to side effect of HA. Patient was last seen by PCP, Cassandra Borer, White on 01/07/24. BP had improved since starting amlodipine  to ~149/90s mmHg. No medication changes were made. She previously reported that she is not planning on having any more pregnancies at this time. She was engaged by pharmacy for follow-up on 01/14/24. She reported that she self discontinued labetalol , and had ben taking amlodipine . She reported she was started on guanfacine and clonidine for ADHD, but neither medication was helping her. Her home BP continued to be elevated so she was instructed to increase amlodipine  to 10 mg daily and continue hydrochlorothiazide .   Today, patient reports doing well. Her BP have been well controlled since increasing amlodipine . She denies issues with side effects, besides frequent urination with hydrochlorothiazide . She reports that her psychiatrist, Cassandra White increased her guanfacine to 3 mg daily and added hydroxyzine to help her with sleep.    Care Team: Primary Care Provider: Borer Cassandra RAMAN, White ; Next Scheduled Visit: 07/11/24 Vascular: Cassandra Serve, MD, Scheduled 04/11/24   Medication Access/Adherence  Current Pharmacy:  [id number] [address], [place] - [address] AT [facility] [address] [place] [address] Phone: [phone] Fax: [phone]   Patient reports affordability concerns with their medications: No   Patient reports access/transportation concerns to their pharmacy: No  Patient reports adherence concerns with their medications:  No     Hypertension:  Current medications: hydrochlorothiazide  25 mg daily, amlodipine  10 mg daily, clondine 0.1 mg at bedtime (prescribed by psychiatry for sleep) Medications previously tried: labetalol  (headaches)  She was recently diagnosed with ADHD - started guanfacine and clonidine (sleep). Is seeing Cassandra White (psychiatry)  Patient has a validated, automated, upper arm home BP cuff - checking twice daily (AM and after work). Takes medication as soon as she wakes up, then monitors ~40 min later. Current blood pressure readings readings: 120/80, 127/80, this AM 118/84 mmHg   Patient denies hypotensive White/sx including dizziness, lightheadedness.  Patient denies hypertensive symptoms including chest pain, shortness of breath. Headaches have resolved.   Objective:  BP Readings from Last 3 Encounters:  01/07/24 (!) 149/93  11/23/23 (!) 157/103  11/13/23 (!) 154/94    Lab Results  Component Value Date   HGBA1C 5.5 01/07/2024    Lab Results  Component Value Date   CREATININE 1.04 (H) 01/07/2024   BUN 13 01/07/2024   NA 139 01/07/2024   K 3.9 01/07/2024   CL 102 01/07/2024   CO2 22 01/07/2024    Lab Results  Component Value Date   LDLDIRECT 65 12/12/2011    Medications Reviewed Today     Reviewed by Cassandra White, RPH (Pharmacist) on 02/26/24 at 1344  Med List Status: <None>   Medication Order Taking? Sig Documenting Provider Last Dose Status Informant  amLODipine  (NORVASC ) 10 MG tablet [id number] Yes Take 1 tablet (10 mg total) by mouth daily. Cassandra White  White, White  Active   cloNIDine HCl (KAPVAY) 0.1 MG TB12 ER tablet [id number] Yes Take 0.1-0.2 mg by mouth at bedtime. [provider]  Active   ferrous sulfate  325 (65 FE) MG tablet [id number]  Take 325 mg by mouth daily with breakfast. [provider]  Active             Med Note (Cassandra White   Fri Apr 14, 2022  9:59 AM) Takes every other week, M-F  Patient taking differently:   Discontinued 02/26/24 1338 (Dose change)   GuanFACINE HCl 3 MG TB24 [id number] Yes Take 1 tablet by mouth daily. [provider]  Active   hydrochlorothiazide  (HYDRODIURIL ) 25 MG tablet [id number] Yes Take 1 tablet (25 mg total) by mouth daily. Cassandra Cassandra RAMAN, White  Active   hydrOXYzine (ATARAX) 25 MG tablet [id number] Yes Take 25 mg by mouth 2 (two) times daily. [provider]  Active   naproxen  (NAPROSYN ) 500 MG tablet [id number]  Take 1 tablet (500 mg total) by mouth 2 (two) times daily. Bero, Michael M, MD  Active   ondansetron  (ZOFRAN -ODT) 4 MG disintegrating tablet [id number]  Take 1 tablet (4 mg total) by mouth every 8 (eight) hours as needed for nausea or vomiting. Ruthe Cornet, DO  Active   Vitamin D, Ergocalciferol, (DRISDOL) 1.25 MG (50000 UNIT) CAPS capsule [id number]  Take 50,000 Units by mouth once a week. [provider]  Active               Assessment/Plan:   Hypertension: - Currently controlled with home BP consistently below goal < 130/80 mmHg. She is tolerating her current regimen well. Potassium WNL after initiation of hydrochlorothiazide . Advised patient to monitor for worsened LEE. Would consider ARB as next line therapy if needed, as pt reports that she is not planning additional pregnancies. - Reviewed long term cardiovascular and renal outcomes of uncontrolled blood pressure - Reviewed appropriate blood pressure monitoring technique and reviewed goal blood pressure. Recommended to check home blood pressure and heart rate once daily at least 1 hour after taking medications.  - Recommend to continue hydrochlorothiazide  25 mg daily - Recommend to continue amlodipine  to 10 mg daily.    Follow Up Plan: Pharmacist telephone 04/30/24, PCP 07/11/24    Lorain Baseman, PharmD [facility] [phone]

## 2024-03-06 ENCOUNTER — Ambulatory Visit: Payer: Self-pay | Admitting: Surgery

## 2024-03-06 NOTE — H&P (Signed)
 Subjective   Chief Complaint: Cholelithiasis     History of Present Illness: Cassandra White is a 38 y.o. female who is seen today as an office consultation at the request of Dr. Arch for evaluation of cholelithiasis .   This is a 38 year old female with no significant past medical history who presented to the emergency department on 03/22/2022 with acute onset of right upper quadrant abdominal pain.  This was associated with radiation of the pain to her back specifically the right shoulder.  She did describe some nausea but no vomiting.  No diarrhea.  She was evaluated in the emergency department.  White count and liver function tests were normal.  Ultrasound showed multiple gallstones but no sign of acute cholecystitis.  Her symptoms were resolved so the patient was discharged with instructions to follow-up for elective cholecystectomy.  She was initially seen 2 years ago and we recommended elective surgery.  She failed to schedule that.  Her symptoms have become more frequent and more severe.  Over the last few weeks, her pain has been almost constant with radiation around to her side.  She denies any fever.  She does have persistent nausea and vomiting and occasional diarrhea.  She presents now to schedule surgery.  In January of this year, she had a repeat ultrasound that still showed cholelithiasis with no sign of acute cholecystitis.  Liver function test performed in July of this year were normal.  She has been on new medications to try to control her blood pressure which has been quite elevated.     Review of Systems: A complete review of systems was obtained from the patient.  I have reviewed this information and discussed as appropriate with the patient.  See HPI as well for other ROS.  Review of Systems  Constitutional: Negative.   HENT: Negative.    Eyes: Negative.   Respiratory: Negative.    Cardiovascular: Negative.   Gastrointestinal:  Positive for abdominal pain, diarrhea,  nausea and vomiting.  Genitourinary:  Positive for flank pain.  Skin: Negative.   Neurological: Negative.   Endo/Heme/Allergies: Negative.   Psychiatric/Behavioral: Negative.        Medical History: Past Medical History:  Diagnosis Date   Anemia    DVT (deep venous thrombosis) (CMS/HHS-HCC)    Hypertension     Patient Active Problem List  Diagnosis   Calculus of gallbladder with chronic cholecystitis without obstruction   Anemia   Asthma (HHS-HCC)   Deep vein thrombosis (DVT) of right radial vein (CMS/HHS-HCC)   Edema   History of DVT (deep vein thrombosis)   History of gastric ulcer   Maternal care for cervical incompetence, unspecified trimester (HHS-HCC)   Morbid obesity (CMS/HHS-HCC)   PCOS (polycystic ovarian syndrome)    Past Surgical History:  Procedure Laterality Date   bunionectomy     DILATION AND CURETTAGE, DIAGNOSTIC / THERAPEUTIC       Allergies  Allergen Reactions   Latex Anaphylaxis, Hives and Swelling   Avocado Nausea    Current Outpatient Medications on File Prior to Visit  Medication Sig Dispense Refill   amLODIPine  (NORVASC ) 10 MG tablet Take 10 mg by mouth once daily     cloNIDine HCL (KAPVAY) 0.1 mg ER tablet Take 0.1-0.2 mg by mouth     ferrous sulfate  325 (65 FE) MG tablet Take 325 mg by mouth every other day     guanFACINE (INTUNIV) 3 mg ER tablet Take 3 mg by mouth once daily  hydroCHLOROthiazide  (HYDRODIURIL ) 25 MG tablet Take 25 mg by mouth once daily     hydrOXYzine (ATARAX) 25 MG tablet Take 25 mg by mouth 2 (two) times daily     labetaloL  (TRANDATE ) 300 MG tablet      naproxen  (NAPROSYN ) 500 MG tablet Take 500 mg by mouth 2 (two) times daily (Patient not taking: Reported on 03/06/2024)     oxyCODONE  (ROXICODONE ) 5 MG immediate release tablet Take 1 tablet (5 mg total) by mouth every 4 (four) hours as needed for Pain (Patient not taking: Reported on 03/06/2024) 20 tablet 0   No current facility-administered medications on file prior  to visit.    Family History  Problem Relation Age of Onset   Obesity Mother    Deep vein thrombosis (DVT or abnormal blood clot formation) Mother    Obesity Father    Diabetes Father      Social History   Tobacco Use  Smoking Status Never  Smokeless Tobacco Never     Social History   Socioeconomic History   Marital status: Single  Tobacco Use   Smoking status: Never   Smokeless tobacco: Never  Vaping Use   Vaping status: Never Used  Substance and Sexual Activity   Alcohol use: Never   Drug use: Never   Social Drivers of Health   Food Insecurity: Food Insecurity Present (06/02/2021)   Received from [facility]   Hunger Vital Sign    Within the past 12 months, you worried that your food would run out before you got the money to buy more.: Sometimes true    Within the past 12 months, the food you bought just didn't last and you didn't have money to get more.: Sometimes true  Transportation Needs: No Transportation Needs (06/02/2021)   Received from [facility]    Lack of Transportation (Medical): No    Lack of Transportation (Non-Medical): No    Objective:    Vitals:   03/06/24 1059 03/06/24 1100 03/06/24 1115  BP: (!) 178/142  (!) 163/102  Pulse: 100  82  Temp: 36.9 C (98.5 F)    Weight: (!) 176.9 kg (390 lb)    PainSc:  0-No pain     Body mass index is 59.3 kg/m.  Physical Exam   Constitutional:  WDWN in NAD, conversant, no obvious deformities; lying in bed comfortably Eyes:  Pupils equal, round; sclera anicteric; moist conjunctiva; no lid lag HENT:  Oral mucosa moist; good dentition  Neck:  No masses palpated, trachea midline; no thyromegaly Lungs:  CTA bilaterally; normal respiratory effort CV:  Regular rate and rhythm; no murmurs; extremities well-perfused with no edema Abd:  +bowel sounds, obese, soft, mild RUQ tenderness;  no palpable organomegaly; no palpable hernias Musc:  Normal gait; no apparent clubbing or  cyanosis in extremities Lymphatic:  No palpable cervical or axillary lymphadenopathy Skin:  Warm, dry; no sign of jaundice Psychiatric - alert and oriented x 4; calm mood and affect   Labs, Imaging and Diagnostic Testing: CLINICAL DATA:  38 year old female with right upper quadrant pain.   EXAM: ULTRASOUND ABDOMEN LIMITED RIGHT UPPER QUADRANT   COMPARISON:  CT Abdomen and Pelvis 01/12/2011.   FINDINGS: Gallbladder:   Shadowing echogenic gallstones (image 7), possibly up to 3 cm diameter. Gallbladder appears partially contracted and wall thickness is within normal limits. No pericholecystic fluid. No sonographic Murphy sign elicited.   Common bile duct:   Diameter: 5 mm, normal.   Liver:  Liver echogenicity within normal limits (image 49). No discrete liver lesion. Portal vein is patent on color Doppler imaging with normal direction of blood flow towards the liver.   Other: Negative visible right kidney.  No free fluid.   IMPRESSION: Cholelithiasis with no evidence of acute cholecystitis or bile duct obstruction.     Electronically Signed   By: VEAR Hurst M.D.   On: 07/15/2023 08:02    Assessment and Plan:  Diagnoses and all orders for this visit:  Calculus of gallbladder with chronic cholecystitis without obstruction  Other orders -     amoxicillin -clavulanate (AUGMENTIN ) 875-125 mg tablet; Take 1 tablet (875 mg total) by mouth every 12 (twelve) hours for 5 days -     ondansetron  (ZOFRAN -ODT) 4 MG disintegrating tablet; Take 1 tablet (4 mg total) by mouth every 8 (eight) hours as needed for Nausea for up to 7 days -     oxyCODONE  (ROXICODONE ) 5 MG immediate release tablet; Take 1 tablet (5 mg total) by mouth every 6 (six) hours as needed for Pain    Recommend laparoscopic cholecystectomy with intraoperative cholangiogram.  We will try to schedule this within the next couple of weeks.  I gave her some as needed medications and a short course of antibiotics to see  if this helps with her symptoms prior to surgery.  The surgical procedure has been discussed with the patient.  Potential risks, benefits, alternative treatments, and expected outcomes have been explained.  All of the patient's questions at this time have been answered.  The likelihood of reaching the patient's treatment goal is good.  The patient understands the proposed surgical procedure and wishes to proceed.   DONNICE DEWAYNE LIMA, MD  03/06/2024 11:31 AM

## 2024-03-06 NOTE — H&P (View-Only) (Signed)
 Subjective   Chief Complaint: Cholelithiasis     History of Present Illness: Cassandra White is a 38 y.o. female who is seen today as an office consultation at the request of Dr. Arch for evaluation of cholelithiasis .   This is a 38 year old female with no significant past medical history who presented to the emergency department on 03/22/2022 with acute onset of right upper quadrant abdominal pain.  This was associated with radiation of the pain to her back specifically the right shoulder.  She did describe some nausea but no vomiting.  No diarrhea.  She was evaluated in the emergency department.  White count and liver function tests were normal.  Ultrasound showed multiple gallstones but no sign of acute cholecystitis.  Her symptoms were resolved so the patient was discharged with instructions to follow-up for elective cholecystectomy.  She was initially seen 2 years ago and we recommended elective surgery.  She failed to schedule that.  Her symptoms have become more frequent and more severe.  Over the last few weeks, her pain has been almost constant with radiation around to her side.  She denies any fever.  She does have persistent nausea and vomiting and occasional diarrhea.  She presents now to schedule surgery.  In January of this year, she had a repeat ultrasound that still showed cholelithiasis with no sign of acute cholecystitis.  Liver function test performed in July of this year were normal.  She has been on new medications to try to control her blood pressure which has been quite elevated.     Review of Systems: A complete review of systems was obtained from the patient.  I have reviewed this information and discussed as appropriate with the patient.  See HPI as well for other ROS.  Review of Systems  Constitutional: Negative.   HENT: Negative.    Eyes: Negative.   Respiratory: Negative.    Cardiovascular: Negative.   Gastrointestinal:  Positive for abdominal pain, diarrhea,  nausea and vomiting.  Genitourinary:  Positive for flank pain.  Skin: Negative.   Neurological: Negative.   Endo/Heme/Allergies: Negative.   Psychiatric/Behavioral: Negative.        Medical History: Past Medical History:  Diagnosis Date   Anemia    DVT (deep venous thrombosis) (CMS/HHS-HCC)    Hypertension     Patient Active Problem List  Diagnosis   Calculus of gallbladder with chronic cholecystitis without obstruction   Anemia   Asthma (HHS-HCC)   Deep vein thrombosis (DVT) of right radial vein (CMS/HHS-HCC)   Edema   History of DVT (deep vein thrombosis)   History of gastric ulcer   Maternal care for cervical incompetence, unspecified trimester (HHS-HCC)   Morbid obesity (CMS/HHS-HCC)   PCOS (polycystic ovarian syndrome)    Past Surgical History:  Procedure Laterality Date   bunionectomy     DILATION AND CURETTAGE, DIAGNOSTIC / THERAPEUTIC       Allergies  Allergen Reactions   Latex Anaphylaxis, Hives and Swelling   Avocado Nausea    Current Outpatient Medications on File Prior to Visit  Medication Sig Dispense Refill   amLODIPine  (NORVASC ) 10 MG tablet Take 10 mg by mouth once daily     cloNIDine HCL (KAPVAY) 0.1 mg ER tablet Take 0.1-0.2 mg by mouth     ferrous sulfate  325 (65 FE) MG tablet Take 325 mg by mouth every other day     guanFACINE (INTUNIV) 3 mg ER tablet Take 3 mg by mouth once daily  hydroCHLOROthiazide  (HYDRODIURIL ) 25 MG tablet Take 25 mg by mouth once daily     hydrOXYzine (ATARAX) 25 MG tablet Take 25 mg by mouth 2 (two) times daily     labetaloL  (TRANDATE ) 300 MG tablet      naproxen  (NAPROSYN ) 500 MG tablet Take 500 mg by mouth 2 (two) times daily (Patient not taking: Reported on 03/06/2024)     oxyCODONE  (ROXICODONE ) 5 MG immediate release tablet Take 1 tablet (5 mg total) by mouth every 4 (four) hours as needed for Pain (Patient not taking: Reported on 03/06/2024) 20 tablet 0   No current facility-administered medications on file prior  to visit.    Family History  Problem Relation Age of Onset   Obesity Mother    Deep vein thrombosis (DVT or abnormal blood clot formation) Mother    Obesity Father    Diabetes Father      Social History   Tobacco Use  Smoking Status Never  Smokeless Tobacco Never     Social History   Socioeconomic History   Marital status: Single  Tobacco Use   Smoking status: Never   Smokeless tobacco: Never  Vaping Use   Vaping status: Never Used  Substance and Sexual Activity   Alcohol use: Never   Drug use: Never   Social Drivers of Health   Food Insecurity: Food Insecurity Present (06/02/2021)   Received from [facility]   Hunger Vital Sign    Within the past 12 months, you worried that your food would run out before you got the money to buy more.: Sometimes true    Within the past 12 months, the food you bought just didn't last and you didn't have money to get more.: Sometimes true  Transportation Needs: No Transportation Needs (06/02/2021)   Received from [facility]    Lack of Transportation (Medical): No    Lack of Transportation (Non-Medical): No    Objective:    Vitals:   03/06/24 1059 03/06/24 1100 03/06/24 1115  BP: (!) 178/142  (!) 163/102  Pulse: 100  82  Temp: 36.9 C (98.5 F)    Weight: (!) 176.9 kg (390 lb)    PainSc:  0-No pain     Body mass index is 59.3 kg/m.  Physical Exam   Constitutional:  WDWN in NAD, conversant, no obvious deformities; lying in bed comfortably Eyes:  Pupils equal, round; sclera anicteric; moist conjunctiva; no lid lag HENT:  Oral mucosa moist; good dentition  Neck:  No masses palpated, trachea midline; no thyromegaly Lungs:  CTA bilaterally; normal respiratory effort CV:  Regular rate and rhythm; no murmurs; extremities well-perfused with no edema Abd:  +bowel sounds, obese, soft, mild RUQ tenderness;  no palpable organomegaly; no palpable hernias Musc:  Normal gait; no apparent clubbing or  cyanosis in extremities Lymphatic:  No palpable cervical or axillary lymphadenopathy Skin:  Warm, dry; no sign of jaundice Psychiatric - alert and oriented x 4; calm mood and affect   Labs, Imaging and Diagnostic Testing: CLINICAL DATA:  38 year old female with right upper quadrant pain.   EXAM: ULTRASOUND ABDOMEN LIMITED RIGHT UPPER QUADRANT   COMPARISON:  CT Abdomen and Pelvis 01/12/2011.   FINDINGS: Gallbladder:   Shadowing echogenic gallstones (image 7), possibly up to 3 cm diameter. Gallbladder appears partially contracted and wall thickness is within normal limits. No pericholecystic fluid. No sonographic Murphy sign elicited.   Common bile duct:   Diameter: 5 mm, normal.   Liver:  Liver echogenicity within normal limits (image 49). No discrete liver lesion. Portal vein is patent on color Doppler imaging with normal direction of blood flow towards the liver.   Other: Negative visible right kidney.  No free fluid.   IMPRESSION: Cholelithiasis with no evidence of acute cholecystitis or bile duct obstruction.     Electronically Signed   By: VEAR Hurst M.D.   On: 07/15/2023 08:02    Assessment and Plan:  Diagnoses and all orders for this visit:  Calculus of gallbladder with chronic cholecystitis without obstruction  Other orders -     amoxicillin -clavulanate (AUGMENTIN ) 875-125 mg tablet; Take 1 tablet (875 mg total) by mouth every 12 (twelve) hours for 5 days -     ondansetron  (ZOFRAN -ODT) 4 MG disintegrating tablet; Take 1 tablet (4 mg total) by mouth every 8 (eight) hours as needed for Nausea for up to 7 days -     oxyCODONE  (ROXICODONE ) 5 MG immediate release tablet; Take 1 tablet (5 mg total) by mouth every 6 (six) hours as needed for Pain    Recommend laparoscopic cholecystectomy with intraoperative cholangiogram.  We will try to schedule this within the next couple of weeks.  I gave her some as needed medications and a short course of antibiotics to see  if this helps with her symptoms prior to surgery.  The surgical procedure has been discussed with the patient.  Potential risks, benefits, alternative treatments, and expected outcomes have been explained.  All of the patient's questions at this time have been answered.  The likelihood of reaching the patient's treatment goal is good.  The patient understands the proposed surgical procedure and wishes to proceed.   DONNICE DEWAYNE LIMA, MD  03/06/2024 11:31 AM

## 2024-03-12 ENCOUNTER — Ambulatory Visit: Payer: Self-pay

## 2024-03-12 NOTE — Telephone Encounter (Signed)
 FYI Only or Action Required?: Action required by provider: request for appointment and update on patient condition.  Patient was last seen in primary care on 01/07/2024 by Oley Bascom RAMAN, NP.  Called Nurse Triage reporting Fatigue.  Symptoms began a week ago.  Interventions attempted: Nothing.  Symptoms are: gradually worsening.  Triage Disposition: See Physician Within 24 Hours- patient to [facility]  Patient/caregiver understands and will follow disposition?: Yes Copied from CRM #[id number]. Topic: Clinical - Red Word Triage >> Mar 12, 2024  2:16 PM Turkey B wrote: [facility] that prompted transfer to Nurse Triage: Patient says iron pills not working, feeling very tired and cold Reason for Disposition  [1] MODERATE weakness (e.g., interferes with work, school, normal activities) AND [2] persists > 3 days  Answer Assessment - Initial Assessment Questions 1. DESCRIPTION: Tell me more about how you (your loved one; patient) are feeling. (e.g., tired, exhausted, lacks energy, feels physically weak)      Reports feeling tired and cold 2. SEVERITY: How bad is it?  How is the weakness or fatigue affecting your ability to do your usual activities?      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 ONSET:  When did these symptoms begin? (e.g., hours, days, weeks, months)     One week, but worsening in the past three days 4. CAUSE: What do you think is causing the weakness or fatigue? (e.g., poor appetite, not drinking enough fluids, trouble sleeping, a new medicine)     Possible anemia 5. OTHER SYMPTOMS: Do you have any other symptoms? (e.g., bleeding, chest pain, diarrhea, fever, cough, SOB, vomiting)     Feeling cold, denies any other symptoms 6. NEW MEDICINES:  Have you started on any new medicines recently? (e.g., opioid pain medicines, benzodiazepines, muscle relaxants, antidepressants,  antihistamines, neuroleptics, beta blockers)     denies 7. TREATMENT: Are you taking any medicines to treat weakness or fatigue?  (e.g., corticosteroids, stimulants, nutritional supplements, alternative therapies)     Iron supplement  Answer Assessment - Initial Assessment Questions 1. DESCRIPTION: Tell me more about how you (your loved one; patient) are feeling. (e.g., tired, exhausted, lacks energy, feels physically weak)      Reports feeling tired and cold 2. SEVERITY: How bad is it?  How is the weakness or fatigue affecting your ability to do your usual activities?      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 ONSET:  When did these symptoms begin? (e.g., hours, days, weeks, months)     One week, but worsening in the past three days 4. CAUSE: What do you think is causing the weakness or fatigue? (e.g., poor appetite, not drinking enough fluids, trouble sleeping, a new medicine)     Possible anemia 5. OTHER SYMPTOMS: Do you have any other symptoms? (e.g., bleeding, chest pain, diarrhea, fever, cough, SOB, vomiting)     Feeling cold, denies any other symptoms 6. NEW MEDICINES:  Have you started on any new medicines recently? (e.g., opioid pain medicines, benzodiazepines, muscle relaxants, antidepressants, antihistamines, neuroleptics, beta blockers)     denies 7. TREATMENT: Are you taking any medicines to treat weakness or fatigue?  (e.g., corticosteroids, stimulants, nutritional supplements, alternative therapies)     Iron supplement  Protocols used: Hospice - Weakness (Generalized) and Fatigue-A-AH, Weakness (  Generalized) and Fatigue-A-AH

## 2024-03-12 NOTE — Telephone Encounter (Signed)
 Pt iron still low. Please advise [facility]

## 2024-03-17 DIAGNOSIS — I10 Essential (primary) hypertension: Secondary | ICD-10-CM | POA: Insufficient documentation

## 2024-03-20 NOTE — Progress Notes (Signed)
 Surgical Instructions   Your procedure is scheduled on Wednesday March 26, 2024. Report to [facility] Main Entrance A at 9:45 A.M., then check in with the Admitting office. Any questions or running late day of surgery: call [phone]  Questions prior to your surgery date: call [phone], Monday-Friday, 8am-4pm. If you experience any cold or flu symptoms such as cough, fever, chills, shortness of breath, etc. between now and your scheduled surgery, please notify us  at the above number.     Remember:  Do not eat after midnight the night before your surgery   You may drink clear liquids until 8:45 the morning of your surgery.   Clear liquids allowed are: Water, Non-Citrus Juices (without pulp), Carbonated Beverages, Clear Tea (no milk, honey, etc.), Black Coffee Only (NO MILK, CREAM OR POWDERED CREAMER of any kind), and Gatorade.  Patient Instructions  The night before surgery:  No food after midnight. ONLY clear liquids after midnight  The day of surgery (if you do NOT have diabetes):  Drink ONE (1) Pre-Surgery Clear Ensure by 8:45 the morning of surgery. Drink in one sitting. Do not sip.  This drink was given to you during your hospital  pre-op appointment visit.  Nothing else to drink after completing the  Pre-Surgery Clear Ensure.         If you have questions, please contact your surgeon's office.    Take these medicines the morning of surgery with A SIP OF WATER  amLODipine  (NORVASC )  hydrOXYzine (ATARAX)   May take these medicines IF NEEDED: oxyCODONE  (OXY IR/ROXICODONE )    One week prior to surgery, STOP taking any Aspirin (unless otherwise instructed by your surgeon) Aleve , Naproxen , Ibuprofen , Motrin , Advil , Goody's, BC's, all herbal medications, fish oil, and non-prescription vitamins.                     Do NOT Smoke (Tobacco/Vaping) for 24 hours prior to your procedure.  If you use a CPAP at night, you may bring your mask/headgear for your overnight  stay.   You will be asked to remove any contacts, glasses, piercing's, hearing aid's, dentures/partials prior to surgery. Please bring cases for these items if needed.    Patients discharged the day of surgery will not be allowed to drive home, and someone needs to stay with them for 24 hours.  SURGICAL WAITING ROOM VISITATION Patients may have no more than 2 support people in the waiting area - these visitors may rotate.   Pre-op nurse will coordinate an appropriate time for 1 ADULT support person, who may not rotate, to accompany patient in pre-op.  Children under the age of 31 must have an adult with them who is not the patient and must remain in the main waiting area with an adult.  If the patient needs to stay at the hospital during part of their recovery, the visitor guidelines for inpatient rooms apply.  Please refer to the [facility] website for the visitor guidelines for any additional information.   If you received a COVID test during your pre-op visit  it is requested that you wear a mask when out in public, stay away from anyone that may not be feeling well and notify your surgeon if you develop symptoms. If you have been in contact with anyone that has tested positive in the last 10 days please notify you surgeon.      Pre-operative CHG Bathing Instructions   You can play a key role in reducing the risk  of infection after surgery. Your skin needs to be as free of germs as possible. You can reduce the number of germs on your skin by washing with CHG (chlorhexidine gluconate) soap before surgery. CHG is an antiseptic soap that kills germs and continues to kill germs even after washing.   DO NOT use if you have an allergy to chlorhexidine/CHG or antibacterial soaps. If your skin becomes reddened or irritated, stop using the CHG and notify one of our RNs at [phone].              TAKE A SHOWER THE NIGHT BEFORE SURGERY AND THE DAY OF SURGERY    Please keep in mind the  following:  DO NOT shave, including legs and underarms, 48 hours prior to surgery.   Place clean sheets on your bed the night before surgery Use a clean washcloth (not used since being washed) for each shower. DO NOT sleep with pet's night before surgery.  CHG Shower Instructions:  Wash your face and private area with normal soap. If you choose to wash your hair, wash first with your normal shampoo.  After you use shampoo/soap, rinse your hair and body thoroughly to remove shampoo/soap residue.  Turn the water OFF and apply half the bottle of CHG soap to a CLEAN washcloth.  Apply CHG soap ONLY FROM YOUR NECK DOWN TO YOUR TOES (washing for 3-5 minutes)  DO NOT use CHG soap on face, private areas, open wounds, or sores.  Pay special attention to the area where your surgery is being performed.  If you are having back surgery, having someone wash your back for you may be helpful. Wait 2 minutes after CHG soap is applied, then you may rinse off the CHG soap.  Pat dry with a clean towel  Put on clean pajamas    Additional instructions for the day of surgery: DO NOT APPLY any lotions, deodorants or perfumes.   Do not wear jewelry or makeup Do not wear nail polish, gel polish, artificial nails, or any other type of covering on natural nails (fingers and toes) Do not bring valuables to the hospital. [facility] is not responsible for valuables/personal belongings. Put on clean/comfortable clothes.  Please brush your teeth.  Ask your nurse before applying any prescription medications to the skin.

## 2024-03-21 ENCOUNTER — Other Ambulatory Visit: Payer: Self-pay

## 2024-03-21 ENCOUNTER — Other Ambulatory Visit (HOSPITAL_COMMUNITY)

## 2024-03-21 ENCOUNTER — Encounter (HOSPITAL_COMMUNITY): Payer: Self-pay

## 2024-03-21 ENCOUNTER — Encounter (HOSPITAL_COMMUNITY)
Admission: RE | Admit: 2024-03-21 | Discharge: 2024-03-21 | Disposition: A | Discharge status: Home / Self Care | Source: Ambulatory Visit | Attending: Surgery | Admitting: Surgery

## 2024-03-21 VITALS — BP 176/96 | HR 99 | Temp 98.5°F | Resp 18 | Ht 69.0 in | Wt 386.6 lb

## 2024-03-21 DIAGNOSIS — I2510 Atherosclerotic heart disease of native coronary artery without angina pectoris: Secondary | ICD-10-CM | POA: Diagnosis not present

## 2024-03-21 DIAGNOSIS — E282 Polycystic ovarian syndrome: Secondary | ICD-10-CM | POA: Insufficient documentation

## 2024-03-21 DIAGNOSIS — K8010 Calculus of gallbladder with chronic cholecystitis without obstruction: Secondary | ICD-10-CM | POA: Diagnosis not present

## 2024-03-21 DIAGNOSIS — I10 Essential (primary) hypertension: Secondary | ICD-10-CM | POA: Insufficient documentation

## 2024-03-21 DIAGNOSIS — F909 Attention-deficit hyperactivity disorder, unspecified type: Secondary | ICD-10-CM | POA: Diagnosis not present

## 2024-03-21 DIAGNOSIS — Z86718 Personal history of other venous thrombosis and embolism: Secondary | ICD-10-CM | POA: Insufficient documentation

## 2024-03-21 DIAGNOSIS — Z79899 Other long term (current) drug therapy: Secondary | ICD-10-CM | POA: Diagnosis not present

## 2024-03-21 DIAGNOSIS — Z01818 Encounter for other preprocedural examination: Secondary | ICD-10-CM | POA: Insufficient documentation

## 2024-03-21 HISTORY — DX: Attention-deficit hyperactivity disorder, unspecified type: F90.9

## 2024-03-21 HISTORY — DX: Depression, unspecified: F32.A

## 2024-03-21 LAB — CBC
HCT: 33.2 % — ABNORMAL LOW (ref 36.0–46.0)
Hemoglobin: 10.6 g/dL — ABNORMAL LOW (ref 12.0–15.0)
MCH: 29.9 pg (ref 26.0–34.0)
MCHC: 31.9 g/dL (ref 30.0–36.0)
MCV: 93.8 fL (ref 80.0–100.0)
Platelets: 375 K/uL (ref 150–400)
RBC: 3.54 MIL/uL — ABNORMAL LOW (ref 3.87–5.11)
RDW: 13.2 % (ref 11.5–15.5)
WBC: 8.7 K/uL (ref 4.0–10.5)
nRBC: 0 % (ref 0.0–0.2)

## 2024-03-21 LAB — BASIC METABOLIC PANEL WITH GFR
Anion gap: 9 (ref 5–15)
BUN: 11 mg/dL (ref 6–20)
CO2: 24 mmol/L (ref 22–32)
Calcium: 8.7 mg/dL — ABNORMAL LOW (ref 8.9–10.3)
Chloride: 106 mmol/L (ref 98–111)
Creatinine, Ser: 0.98 mg/dL (ref 0.44–1.00)
GFR, Estimated: >60 mL/min (ref >60)
Glucose, Bld: 107 mg/dL — ABNORMAL HIGH (ref 70–99)
Potassium: 3.3 mmol/L — ABNORMAL LOW (ref 3.5–5.1)
Sodium: 139 mmol/L (ref 135–145)

## 2024-03-21 NOTE — Progress Notes (Signed)
 PCP - Bascom Borer, NP Cardiologist - Denies  PPM/ICD - Denies Device Orders - n/a Rep Notified - n/a  Chest x-ray - 04/10/2023 EKG - 03/21/2024 Stress Test - Denies ECHO - 07/20/2017 Cardiac Cath - Denies  Sleep Study - Pt is pending a sleep study. STOPBANG score 4 CPAP - n/a  No DM  Last dose of GLP1 agonist- n/a GLP1 instructions: n/a  Blood Thinner Instructions: n/a Aspirin Instructions: n/a  ERAS Protcol - Clear liquids until 0845 morning of surgery PRE-SURGERY Ensure or G2- Ensure given to pt with instructions  COVID TEST- n/a   Anesthesia review: Yes. BP elevated at PAT appointment, 167/104. Pt attributes this to pain (9/10 in abdomen) and hospital not having a cuff that fits her arm (taken on forearm). She says she has a cuff at home that fits and BP this morning was 130/84. Pt also has hx of DVTs for which she was on Xarelto  and Lovenox  during her 2022 pregnancy. She also saw a Hematologist some time ago, but no visits recently. She has not been on either since that pregnancy and has not had any issues. Discussed with Allison Zelenak, PA-C.  Patient denies shortness of breath, fever, cough and chest pain at PAT appointment. Pt denies any respiratory illness/infection in the last two months.    All instructions explained to the patient, with a verbal understanding of the material. Patient agrees to go over the instructions while at home for a better understanding. Patient also instructed to self quarantine after being tested for COVID-19. The opportunity to ask questions was provided.

## 2024-03-21 NOTE — Telephone Encounter (Signed)
 Pt was advised [facility]

## 2024-03-25 NOTE — Progress Notes (Signed)
 Anesthesia Chart Review:  Case: [id number] Date/Time: 03/26/24 1130   Procedure: LAPAROSCOPIC CHOLECYSTECTOMY WITH INTRAOPERATIVE CHOLANGIOGRAM   Anesthesia type: General   Pre-op diagnosis: CHRONIC CALCULUS CHOLECYSTECTOMY   [facility] ROOM 02 / [facility] OR   Surgeons: Belinda Cough, MD       DISCUSSION: Patient is a 38 year old female scheduled for the above procedure. Surgery initially advised 2 years ago for symptomatic cholelithiasis/chronic cholecystitis. More recently, she was re-evaluated after symptoms became more frequent and severe. She now desired to schedule cholecystectomy.  She reported a pending sleep study (STOPBANG OSA screening score was 4.).  History includes never smoker, HTN, chronic LE edema (since age 13), DVT (RLE DVT 08/2014; RUE/radial vein DVT 12/18/2016; has previously taken Lovenox  during pregnancy), anemia (s/p PRBC 06/30/2021 in setting of PPROM and vaginal delivery of a non-viable fetus at 20w), PCOS, ADHD, morbid obesity.  BP 167/104 and 176/96 at PAT. She says she monitors her BP at home, and her cuff at home is larger and fits her arm better than the cuff uses at PAT. Home BP was 130/84 on 03/21/2024. She also said she felt her abdominal pain attributed to increasing her BP. She denied chest pain or SOB. EKG showed NS, non-specific T wave abnormality. She is on amlodipine  10 mg daily, hydrochlorothiazide  25 md gaily. She is also on Kapvy (clonidine ER 0.1 mg BID) and guanfacine 3 mg daily for ADHD.  03/21/2024 CBC and BMP noted. HGB stable at 10.6. LFTs normal on 01/07/2024.   Anesthesia team to evaluate on the day of surgery. She will get vitals/BP on arrival for surgery. She also need a urine pregnancy test on arrival.   VS: BP (!) 176/96   Pulse 99   Temp 36.9 C   Resp 18   Ht 5' 9 (1.753 m)   Wt (!) 175.4 kg   LMP 03/11/2024 (Exact Date)   SpO2 95%   BMI 57.09 kg/m  BP Readings from Last 3 Encounters:  03/21/24 (!) 176/96  01/07/24 (!) 149/93   11/23/23 (!) 157/103     PROVIDERS: Oley Bascom RAMAN, NP is PCP ([facility])   LABS: Labs reviewed: Acceptable for surgery. A1c 5.5% on 01/07/2024. (all labs ordered are listed, but only abnormal results are displayed)  Labs Reviewed  BASIC METABOLIC PANEL WITH GFR - Abnormal; Notable for the following components:      Result Value   Potassium 3.3 (*)    Glucose, Bld 107 (*)    Calcium  8.7 (*)    All other components within normal limits  CBC - Abnormal; Notable for the following components:   RBC 3.54 (*)    Hemoglobin 10.6 (*)    HCT 33.2 (*)    All other components within normal limits    IMAGES: US  Abd 07/15/2023: IMPRESSION: Cholelithiasis with no evidence of acute cholecystitis or bile duct obstruction.     EKG: 03/21/2024: Normal sinus rhythm Nonspecific T wave abnormality Abnormal ECG When compared with ECG of 15-Sep-2022 16:25, PREVIOUS ECG IS PRESENT Confirmed by Loni Rushing ([id number]) on 03/24/2024 6:38:00 AM   CV: LE Venous US  11/14/2023: Summary:  BILATERAL:  - No evidence of deep vein thrombosis seen in the lower extremities,  bilaterally.  - No evidence of superficial venous thrombosis in the lower extremities,  bilaterally.  -No evidence of popliteal cyst, bilaterally.  RIGHT:  - Avascular complex structure at area of concern, mid shin, measuring 4.1  cm in length x 3.3 cm in width.  Ultrasound characteristics consistent with  a hematoma.      Echo 07/20/2017: Study Conclusions  - Left ventricle: The cavity size was normal. Systolic function was    normal. The estimated ejection fraction was in the range of 60%    to 65%. Wall motion was normal; there were no regional wall    motion abnormalities. Left ventricular diastolic function    parameters were normal.  - Mitral valve: There was trivial regurgitation.  - Right ventricle: The cavity size was mildly dilated. Wall    thickness was normal. Systolic function was normal.   Past  Medical History:  Diagnosis Date   ADHD (attention deficit hyperactivity disorder)    Anemia    Blood transfusion without reported diagnosis 06/2021   Breast disorder    lump in right breast   Chronic hypertension during pregnancy, antepartum 06/02/2021   Depression    DVT (deep venous thrombosis) (HCC) 2019   Edema of left lower extremity    Since age 55    Fibrocystic breast changes    lump in right breast U/S 11/22/11: Multiple complicated cysts with mural nodularity as discussed above. Given the appearance, I recommend tissue sampling of the most complicated appearing lesion. If this demonstrates benign findings, recommend following the remaining lesions. Ultrasound- guided core biopsy was discussed with the patient. This will be scheduled per patient preference.  BI-RADS CATEGO   History of blood clots    right leg, left leg, right forearm   Obese    PCOS (polycystic ovarian syndrome)     Past Surgical History:  Procedure Laterality Date   BREAST BIOPSY     x3   BUNIONECTOMY     right foot   CERVICAL CERCLAGE N/A 06/17/2021   Procedure: CERCLAGE CERVICAL;  Surgeon: Jayne Vonn DEL, MD;  Location: [facility] LD ORS;  Service: Gynecology;  Laterality: N/A;   COLPOSCOPY  2009/2010   DILATION AND CURETTAGE OF UTERUS  06/19/2021    MEDICATIONS:  amLODipine  (NORVASC ) 10 MG tablet   cloNIDine HCl (KAPVAY) 0.1 MG TB12 ER tablet   ferrous sulfate  325 (65 FE) MG tablet   GuanFACINE HCl 3 MG TB24   hydrochlorothiazide  (HYDRODIURIL ) 25 MG tablet   hydrOXYzine (ATARAX) 25 MG tablet   naproxen  (NAPROSYN ) 500 MG tablet   ondansetron  (ZOFRAN -ODT) 4 MG disintegrating tablet   oxyCODONE  (OXY IR/ROXICODONE ) 5 MG immediate release tablet   No current facility-administered medications for this encounter.    Isaiah Ruder, PA-C Surgical Short Stay/Anesthesiology [facility] Phone [phone] [facility] Phone [phone] 03/25/2024 10:17 AM

## 2024-03-25 NOTE — Anesthesia Preprocedure Evaluation (Signed)
 Anesthesia Evaluation  Patient identified by MRN, date of birth, ID band Patient awake    Reviewed: Allergy & Precautions, NPO status , Patient's Chart, lab work & pertinent test results, reviewed documented beta blocker date and time   History of Anesthesia Complications Negative for: history of anesthetic complications  Airway Mallampati: I  TM Distance: >3 FB Neck ROM: Full    Dental  (+) Dental Advisory Given, Missing   Pulmonary neg pulmonary ROS   breath sounds clear to auscultation       Cardiovascular hypertension, Pt. on medications and Pt. on home beta blockers (-) angina  Rhythm:Regular Rate:Normal  '19 ECHO:   - Left ventricle: The cavity size was normal. Systolic function was    normal. The estimated ejection fraction was in the range of 60%    to 65%. Wall motion was normal; there were no regional wall    motion abnormalities. Left ventricular diastolic function    parameters were normal.  - Mitral valve: There was trivial regurgitation.  - Right ventricle: The cavity size was mildly dilated. Wall    thickness was normal. Systolic function was normal.     Neuro/Psych  PSYCHIATRIC DISORDERS (ADHD)  Depression    negative neurological ROS     GI/Hepatic negative GI ROS, Neg liver ROS,,,  Endo/Other  PCOS BMI 57  Renal/GU negative Renal ROS     Musculoskeletal   Abdominal   Peds  Hematology  (+) Blood dyscrasia (Hb 10.6, plt 375k)   Anesthesia Other Findings   Reproductive/Obstetrics                              Anesthesia Physical Anesthesia Plan  ASA: 3  Anesthesia Plan: General   Post-op Pain Management: Tylenol  PO (pre-op)*   Induction: Intravenous  PONV Risk Score and Plan: 3 and Ondansetron , Dexamethasone and Treatment may vary due to age or medical condition  Airway Management Planned: Oral ETT  Additional Equipment: None  Intra-op Plan:    Post-operative Plan: Extubation in OR  Informed Consent: I have reviewed the patients History and Physical, chart, labs and discussed the procedure including the risks, benefits and alternatives for the proposed anesthesia with the patient or authorized representative who has indicated his/her understanding and acceptance.     Dental advisory given  Plan Discussed with: CRNA and Surgeon  Anesthesia Plan Comments: (PAT note written 03/25/2024 by Dilana Mcphie, PA-C.  )         Anesthesia Quick Evaluation

## 2024-03-26 ENCOUNTER — Ambulatory Visit (HOSPITAL_COMMUNITY): Admitting: Anesthesiology

## 2024-03-26 ENCOUNTER — Encounter (HOSPITAL_COMMUNITY): Admission: RE | Disposition: A | Payer: Self-pay | Source: Home / Self Care | Attending: Surgery

## 2024-03-26 ENCOUNTER — Ambulatory Visit (HOSPITAL_COMMUNITY): Payer: Self-pay | Admitting: Vascular Surgery

## 2024-03-26 ENCOUNTER — Ambulatory Visit (HOSPITAL_COMMUNITY)

## 2024-03-26 ENCOUNTER — Ambulatory Visit (HOSPITAL_COMMUNITY)
Admission: RE | Admit: 2024-03-26 | Discharge: 2024-03-26 | Disposition: A | Discharge status: Home / Self Care | Attending: Surgery | Admitting: Surgery

## 2024-03-26 ENCOUNTER — Other Ambulatory Visit: Payer: Self-pay

## 2024-03-26 DIAGNOSIS — K8018 Calculus of gallbladder with other cholecystitis without obstruction: Secondary | ICD-10-CM

## 2024-03-26 DIAGNOSIS — Z01818 Encounter for other preprocedural examination: Secondary | ICD-10-CM

## 2024-03-26 DIAGNOSIS — Z6841 Body Mass Index (BMI) 40.0 and over, adult: Secondary | ICD-10-CM | POA: Insufficient documentation

## 2024-03-26 DIAGNOSIS — I10 Essential (primary) hypertension: Secondary | ICD-10-CM

## 2024-03-26 DIAGNOSIS — E282 Polycystic ovarian syndrome: Secondary | ICD-10-CM | POA: Insufficient documentation

## 2024-03-26 DIAGNOSIS — F32A Depression, unspecified: Secondary | ICD-10-CM | POA: Diagnosis not present

## 2024-03-26 DIAGNOSIS — K8010 Calculus of gallbladder with chronic cholecystitis without obstruction: Secondary | ICD-10-CM | POA: Diagnosis present

## 2024-03-26 HISTORY — PX: CHOLECYSTECTOMY: SHX55

## 2024-03-26 LAB — POCT PREGNANCY, URINE: Preg Test, Ur: NEGATIVE

## 2024-03-26 SURGERY — LAPAROSCOPIC CHOLECYSTECTOMY WITH INTRAOPERATIVE CHOLANGIOGRAM
Anesthesia: General | Site: Abdomen

## 2024-03-26 MED ORDER — ROCURONIUM BROMIDE 10 MG/ML (PF) SYRINGE
PREFILLED_SYRINGE | INTRAVENOUS | Status: AC
  Filled 2024-03-26: qty 10

## 2024-03-26 MED ORDER — DEXAMETHASONE SODIUM PHOSPHATE 10 MG/ML IJ SOLN
INTRAMUSCULAR | Status: DC | PRN
  Administered 2024-03-26: 10 mg via INTRAVENOUS

## 2024-03-26 MED ORDER — ACETAMINOPHEN 500 MG PO TABS: 1000.0000 mg | ORAL_TABLET | ORAL | Status: AC

## 2024-03-26 MED ORDER — CHLORHEXIDINE GLUCONATE 0.12 % MT SOLN: 15.0000 mL | Freq: Once | OROMUCOSAL | Status: AC

## 2024-03-26 MED ORDER — GLYCOPYRROLATE PF 0.2 MG/ML IJ SOSY
PREFILLED_SYRINGE | INTRAMUSCULAR | Status: AC
  Filled 2024-03-26: qty 1

## 2024-03-26 MED ORDER — GLYCOPYRROLATE PF 0.2 MG/ML IJ SOSY
PREFILLED_SYRINGE | INTRAMUSCULAR | Status: DC | PRN
Start: 2024-03-26 — End: 2024-03-26
  Administered 2024-03-26: .2 mg via INTRAVENOUS

## 2024-03-26 MED ORDER — SODIUM CHLORIDE 0.9 % IR SOLN
Status: DC | PRN
  Administered 2024-03-26: 1000 mL

## 2024-03-26 MED ORDER — SUCCINYLCHOLINE CHLORIDE 200 MG/10ML IV SOSY
PREFILLED_SYRINGE | INTRAVENOUS | Status: DC | PRN
  Administered 2024-03-26: 200 mg via INTRAVENOUS

## 2024-03-26 MED ORDER — ROCURONIUM BROMIDE 10 MG/ML (PF) SYRINGE
PREFILLED_SYRINGE | INTRAVENOUS | Status: DC | PRN
  Administered 2024-03-26: 70 mg via INTRAVENOUS

## 2024-03-26 MED ORDER — CEFAZOLIN SODIUM-DEXTROSE 2-4 GM/100ML-% IV SOLN
2.0000 g | INTRAVENOUS | Status: AC
  Administered 2024-03-26: 3 g via INTRAVENOUS

## 2024-03-26 MED ORDER — BUPIVACAINE-EPINEPHRINE (PF) 0.25% -1:200000 IJ SOLN
INTRAMUSCULAR | Status: AC
  Filled 2024-03-26: qty 30

## 2024-03-26 MED ORDER — MIDAZOLAM HCL 2 MG/2ML IJ SOLN
INTRAMUSCULAR | Status: DC | PRN
  Administered 2024-03-26: 2 mg via INTRAVENOUS

## 2024-03-26 MED ORDER — LACTATED RINGERS IV SOLN: INTRAVENOUS | Status: DC

## 2024-03-26 MED ORDER — SUCCINYLCHOLINE CHLORIDE 200 MG/10ML IV SOSY
PREFILLED_SYRINGE | INTRAVENOUS | Status: AC
  Filled 2024-03-26: qty 10

## 2024-03-26 MED ORDER — CEFAZOLIN SODIUM-DEXTROSE 2-4 GM/100ML-% IV SOLN
INTRAVENOUS | Status: AC
  Filled 2024-03-26: qty 100

## 2024-03-26 MED ORDER — LIDOCAINE 2% (20 MG/ML) 5 ML SYRINGE
INTRAMUSCULAR | Status: DC | PRN
  Administered 2024-03-26: 60 mg via INTRAVENOUS

## 2024-03-26 MED ORDER — FENTANYL CITRATE (PF) 250 MCG/5ML IJ SOLN
INTRAMUSCULAR | Status: DC | PRN
  Administered 2024-03-26 (×2): 100 ug via INTRAVENOUS
  Administered 2024-03-26: 50 ug via INTRAVENOUS

## 2024-03-26 MED ORDER — CHLORHEXIDINE GLUCONATE CLOTH 2 % EX PADS
6.0000 | MEDICATED_PAD | Freq: Once | CUTANEOUS | Status: AC
  Administered 2024-03-26: 6 via TOPICAL

## 2024-03-26 MED ORDER — ACETAMINOPHEN 500 MG PO TABS
ORAL_TABLET | ORAL | Status: AC
  Administered 2024-03-26: 1000 mg via ORAL
  Filled 2024-03-26: qty 2

## 2024-03-26 MED ORDER — DEXMEDETOMIDINE HCL IN NACL 80 MCG/20ML IV SOLN
INTRAVENOUS | Status: DC | PRN
  Administered 2024-03-26: 4 ug via INTRAVENOUS
  Administered 2024-03-26: 6 ug via INTRAVENOUS
  Administered 2024-03-26: 10 ug via INTRAVENOUS
  Administered 2024-03-26: 20 ug via INTRAVENOUS

## 2024-03-26 MED ORDER — CHLORHEXIDINE GLUCONATE CLOTH 2 % EX PADS: 6.0000 | MEDICATED_PAD | Freq: Once | CUTANEOUS | Status: DC

## 2024-03-26 MED ORDER — MIDAZOLAM HCL 2 MG/2ML IJ SOLN
INTRAMUSCULAR | Status: AC
  Filled 2024-03-26: qty 2

## 2024-03-26 MED ORDER — BUPIVACAINE-EPINEPHRINE 0.25% -1:200000 IJ SOLN
INTRAMUSCULAR | Status: DC | PRN
  Administered 2024-03-26: 10 mL

## 2024-03-26 MED ORDER — ONDANSETRON HCL 4 MG/2ML IJ SOLN
INTRAMUSCULAR | Status: DC | PRN
  Administered 2024-03-26: 4 mg via INTRAVENOUS

## 2024-03-26 MED ORDER — LIDOCAINE 2% (20 MG/ML) 5 ML SYRINGE
INTRAMUSCULAR | Status: AC
  Filled 2024-03-26: qty 5

## 2024-03-26 MED ORDER — OXYCODONE HCL 5 MG PO TABS: 5.0000 mg | ORAL_TABLET | Freq: Four times a day (QID) | ORAL | 0 refills | Status: DC | PRN

## 2024-03-26 MED ORDER — PHENYLEPHRINE 80 MCG/ML (10ML) SYRINGE FOR IV PUSH (FOR BLOOD PRESSURE SUPPORT)
PREFILLED_SYRINGE | INTRAVENOUS | Status: AC
  Filled 2024-03-26: qty 10

## 2024-03-26 MED ORDER — PHENYLEPHRINE 80 MCG/ML (10ML) SYRINGE FOR IV PUSH (FOR BLOOD PRESSURE SUPPORT)
PREFILLED_SYRINGE | INTRAVENOUS | Status: DC | PRN
  Administered 2024-03-26 (×2): 160 ug via INTRAVENOUS
  Administered 2024-03-26: 80 ug via INTRAVENOUS

## 2024-03-26 MED ORDER — SUGAMMADEX SODIUM 200 MG/2ML IV SOLN
INTRAVENOUS | Status: DC | PRN
  Administered 2024-03-26 (×2): 100 mg via INTRAVENOUS
  Administered 2024-03-26 (×2): 50 mg via INTRAVENOUS
  Administered 2024-03-26: 100 mg via INTRAVENOUS

## 2024-03-26 MED ORDER — GLYCOPYRROLATE PF 0.2 MG/ML IJ SOSY
PREFILLED_SYRINGE | INTRAMUSCULAR | Status: AC
Start: 2024-03-26 — End: 2024-03-26
  Filled 2024-03-26: qty 1

## 2024-03-26 MED ORDER — ORAL CARE MOUTH RINSE: 15.0000 mL | Freq: Once | OROMUCOSAL | Status: AC

## 2024-03-26 MED ORDER — PROPOFOL 10 MG/ML IV BOLUS
INTRAVENOUS | Status: DC | PRN
  Administered 2024-03-26: 250 mg via INTRAVENOUS

## 2024-03-26 MED ORDER — FENTANYL CITRATE (PF) 250 MCG/5ML IJ SOLN
INTRAMUSCULAR | Status: AC
  Filled 2024-03-26: qty 5

## 2024-03-26 MED ORDER — CHLORHEXIDINE GLUCONATE 0.12 % MT SOLN
OROMUCOSAL | Status: AC
  Administered 2024-03-26: 15 mL via OROMUCOSAL
  Filled 2024-03-26: qty 15

## 2024-03-26 SURGICAL SUPPLY — 45 items
BAG COUNTER SPONGE SURGICOUNT (BAG) ×1 IMPLANT
BENZOIN TINCTURE PRP APPL 2/3 (GAUZE/BANDAGES/DRESSINGS) ×1 IMPLANT
BLADE CLIPPER SURG (BLADE) IMPLANT
CANISTER SUCTION 3000ML PPV (SUCTIONS) ×1 IMPLANT
CHLORAPREP W/TINT 26 (MISCELLANEOUS) ×1 IMPLANT
CLIP APPLIE ROT 10 11.4 M/L (STAPLE) ×1 IMPLANT
COVER MAYO STAND STRL (DRAPES) ×1 IMPLANT
COVER SURGICAL LIGHT HANDLE (MISCELLANEOUS) ×1 IMPLANT
DRAPE C-ARM 42X120 X-RAY (DRAPES) ×1 IMPLANT
DRSG TEGADERM 2-3/8X2-3/4 SM (GAUZE/BANDAGES/DRESSINGS) ×3 IMPLANT
DRSG TEGADERM 4X4.75 (GAUZE/BANDAGES/DRESSINGS) ×1 IMPLANT
ELECTRODE REM PT RTRN 9FT ADLT (ELECTROSURGICAL) ×1 IMPLANT
GAUZE SPONGE 2X2 8PLY STRL LF (GAUZE/BANDAGES/DRESSINGS) ×1 IMPLANT
GAUZE SPONGE 2X2 STRL 8-PLY (GAUZE/BANDAGES/DRESSINGS) IMPLANT
GLOVE BIO SURGEON STRL SZ7 (GLOVE) ×1 IMPLANT
GLOVE BIOGEL PI IND STRL 7.5 (GLOVE) ×1 IMPLANT
GOWN STRL REUS W/ TWL LRG LVL3 (GOWN DISPOSABLE) ×3 IMPLANT
IRRIGATION SUCT STRKRFLW 2 WTP (MISCELLANEOUS) ×1 IMPLANT
KIT BASIN OR (CUSTOM PROCEDURE TRAY) ×1 IMPLANT
KIT IMAGING PINPOINTPAQ (MISCELLANEOUS) IMPLANT
KIT TURNOVER KIT B (KITS) ×1 IMPLANT
PAD ARMBOARD POSITIONER FOAM (MISCELLANEOUS) ×1 IMPLANT
POUCH RETRIEVAL ECOSAC 10 (ENDOMECHANICALS) IMPLANT
SCISSORS LAP 5X35 DISP (ENDOMECHANICALS) ×1 IMPLANT
SET CHOLANGIOGRAPH 5 50 .035 (SET/KITS/TRAYS/PACK) ×1 IMPLANT
SET TUBE SMOKE EVAC HIGH FLOW (TUBING) ×1 IMPLANT
SLEEVE Z-THREAD 5X100MM (TROCAR) ×1 IMPLANT
SOLN 0.9% NACL 1000 ML (IV SOLUTION) ×1 IMPLANT
SOLN 0.9% NACL POUR BTL 1000ML (IV SOLUTION) ×1 IMPLANT
SOLN STERILE WATER 1000 ML (IV SOLUTION) ×1 IMPLANT
SOLN STERILE WATER BTL 1000 ML (IV SOLUTION) ×1 IMPLANT
SPECIMEN JAR SMALL (MISCELLANEOUS) ×1 IMPLANT
STRIP CLOSURE SKIN 1/2X4 (GAUZE/BANDAGES/DRESSINGS) ×1 IMPLANT
SUT MNCRL AB 4-0 PS2 18 (SUTURE) ×1 IMPLANT
SUT VICRYL 0 UR6 27IN ABS (SUTURE) IMPLANT
SYSTEM BAG RETRIEVAL 10MM (BASKET) IMPLANT
TOWEL GREEN STERILE (TOWEL DISPOSABLE) ×1 IMPLANT
TOWEL GREEN STERILE FF (TOWEL DISPOSABLE) ×1 IMPLANT
TRAY LAPAROSCOPIC MC (CUSTOM PROCEDURE TRAY) ×1 IMPLANT
TROCAR 11X100 Z THREAD (TROCAR) ×1 IMPLANT
TROCAR 12M 150ML BLUNT (TROCAR) IMPLANT
TROCAR 5MMX150MM (TROCAR) IMPLANT
TROCAR BALLN 12MMX100 BLUNT (TROCAR) ×1 IMPLANT
TROCAR Z-THREAD OPTICAL 5X100M (TROCAR) ×1 IMPLANT
WARMER LAPAROSCOPE (MISCELLANEOUS) ×1 IMPLANT

## 2024-03-26 NOTE — Anesthesia Postprocedure Evaluation (Signed)
 Anesthesia Post Note  Patient: Cassandra White  Procedure(s) Performed: LAPAROSCOPIC CHOLECYSTECTOMY (Abdomen)     Patient location during evaluation: PACU Anesthesia Type: General Level of consciousness: awake and alert, patient cooperative and oriented Pain management: pain level controlled Vital Signs Assessment: post-procedure vital signs reviewed and stable Respiratory status: spontaneous breathing, nonlabored ventilation, respiratory function stable and patient connected to nasal cannula oxygen Cardiovascular status: blood pressure returned to baseline and stable Postop Assessment: no apparent nausea or vomiting Anesthetic complications: no   No notable events documented.  Last Vitals:  Vitals:   03/26/24 1245 03/26/24 1300  BP: 125/80 131/89  Pulse: 74 74  Resp: (!) 22 20  Temp:    SpO2: 96% 99%    Last Pain:  Vitals:   03/26/24 1300  PainSc: Asleep                 Tanja Gift,E. Niki Payment

## 2024-03-26 NOTE — Op Note (Signed)
 Laparoscopic Cholecystectomy Procedure Note  Indications: This is a 38 year old female with no significant past medical history who presented to the emergency department on 03/22/2022 with acute onset of right upper quadrant abdominal pain.  This was associated with radiation of the pain to her back specifically the right shoulder.  She did describe some nausea but no vomiting.  No diarrhea.  She was evaluated in the emergency department.  White count and liver function tests were normal.  Ultrasound showed multiple gallstones but no sign of acute cholecystitis.  Her symptoms were resolved so the patient was discharged with instructions to follow-up for elective cholecystectomy.   She was initially seen 2 years ago and we recommended elective surgery.  She failed to schedule that.  Her symptoms have become more frequent and more severe.  Over the last few weeks, her pain has been almost constant with radiation around to her side.  She denies any fever.  She does have persistent nausea and vomiting and occasional diarrhea.  She presents now to schedule surgery.  In January of this year, she had a repeat ultrasound that still showed cholelithiasis with no sign of acute cholecystitis.  Liver function test performed in July of this year were normal.  She has been on new medications to try to control her blood pressure which has been quite elevated.  Pre-operative Diagnosis: Calculus of gallbladder with other cholecystitis, without mention of obstruction  Post-operative Diagnosis: Same  Surgeon: [name]   Assistants: Powell Seats, RNFA  Anesthesia: General endotracheal anesthesia  ASA Class: 2  Procedure Details  The patient was seen again in the Holding Room. The risks, benefits, complications, treatment options, and expected outcomes were discussed with the patient. The possibilities of reaction to medication, pulmonary aspiration, perforation of viscus, bleeding, recurrent infection,  finding a normal gallbladder, the need for additional procedures, failure to diagnose a condition, the possible need to convert to an open procedure, and creating a complication requiring transfusion or operation were discussed with the patient. The likelihood of improving the patient's symptoms with return to their baseline status is good.  The patient and/or family concurred with the proposed plan, giving informed consent. The site of surgery properly noted. The patient was taken to Operating Room, identified as Cassandra White and the procedure verified as Laparoscopic Cholecystectomy with Intraoperative Cholangiogram. A Time Out was held and the above information confirmed.  Prior to the induction of general anesthesia, antibiotic prophylaxis was administered. General endotracheal anesthesia was then administered and tolerated well. After the induction, the abdomen was prepped with Chloraprep and draped in sterile fashion. The patient was positioned in the supine position.    The patient is morbidly obese with a BMI of 57, so I decided to start with an Optivew technique.  Local anesthetic agent was injected into the skin in the left upper quadrant and an incision made.  A 5 mm Optiview trocar was used to cannulate the peritoneal cavity under direct vision.  We insufflated CO2 maintaining a maximum pressure of 15 mmHg.  The laparoscope was inserted and we inspected the peritoneal cavity.  There are no adhesions on the anterior abdominal wall.  We then used local anesthetic and then made a transverse incision below the umbilicus.  We dissected down to the abdominal fascia with blunt dissection.  The fascia was incised vertically and we entered the peritoneal cavity bluntly.  A pursestring suture of 0-Vicryl was placed around the fascial opening.  The Hasson cannula was inserted and  secured with the stay suture.  The initial left upper quadrant port was converted to an 11 mm port. Two 5-mm ports were placed  in the right upper quadrant. All skin incisions were infiltrated with a local anesthetic agent before making the incision and placing the trocars.   We positioned the patient in reverse Trendelenburg, tilted slightly to the patient's left.  The gallbladder was identified, the fundus grasped and retracted cephalad.  She had minimal adhesions to the surface of the gallbladder.  The gallbladder did look chronically thickened and was containing an obvious large gallstone at the infundibulum.  Adhesions were lysed bluntly and with the electrocautery where indicated, taking care not to injure any adjacent organs or viscus. The infundibulum was grasped and retracted laterally, exposing the peritoneum overlying the triangle of Calot. This was then divided and exposed in a blunt fashion. The cystic duct was clearly identified and bluntly dissected circumferentially. A critical view of the cystic duct and cystic artery was obtained.  The cystic duct was then ligated with clips and divided. The cystic artery was, dissected free, ligated with clips and divided as well.   The gallbladder was dissected from the liver bed with cautery and was placed in an Eco sac. The liver bed was irrigated and inspected. Hemostasis was achieved with the electrocautery. Copious irrigation was utilized and was repeatedly aspirated until clear.  The gallbladder and Eco sac were then removed through the umbilical port site.  The pursestring suture was used to close the umbilical fascia.    We again inspected the right upper quadrant for hemostasis.  Pneumoperitoneum was released as we removed the trocars.  4-0 Monocryl was used to close the skin.   Benzoin, steri-strips, and clean dressings were applied. The patient was then extubated and brought to the recovery room in stable condition. Instrument, sponge, and needle counts were correct at closure and at the conclusion of the case.   Findings: Cholecystitis with Cholelithiasis  Estimated  Blood Loss: less than 50 mL         Drains: none         Specimens: Gallbladder           Complications: None; patient tolerated the procedure well.         Disposition: PACU - hemodynamically stable.         Condition: stable  Donnice POUR. Belinda, MD, [facility]  General Surgery   03/26/2024 12:18 PM

## 2024-03-26 NOTE — Discharge Instructions (Signed)
 [facility] ______[facility] LAPAROSCOPIC SURGERY: POST OP INSTRUCTIONS Always review your discharge instruction sheet given to you by the facility where your surgery was performed. IF YOU HAVE DISABILITY OR FAMILY LEAVE FORMS, YOU MUST BRING THEM TO THE OFFICE FOR PROCESSING.   DO NOT GIVE THEM TO YOUR DOCTOR.  A prescription for pain medication may be given to you upon discharge.  Take your pain medication as prescribed, if needed.  If narcotic pain medicine is not needed, then you may take acetaminophen  (Tylenol ) or ibuprofen  (Advil ) as needed. Take your usually prescribed medications unless otherwise directed. If you need a refill on your pain medication, please contact your pharmacy.  They will contact our office to request authorization. Prescriptions will not be filled after 5pm or on week-ends. You should follow a light diet the first few days after arrival home, such as soup and crackers, etc.  Be sure to include lots of fluids daily. Most patients will experience some swelling and bruising in the area of the incisions.  Ice packs will help.  Swelling and bruising can take several days to resolve.  It is common to experience some constipation if taking pain medication after surgery.  Increasing fluid intake and taking a stool softener (such as Colace) will usually help or prevent this problem from occurring.  A mild laxative (Milk of Magnesia or Miralax) should be taken according to package instructions if there are no bowel movements after 48 hours. Unless discharge instructions indicate otherwise, you may remove your bandages 24-48 hours after surgery, and you may shower at that time.  You may have steri-strips (small skin tapes) in place directly over the incision.  These strips should be left on the skin for 7-10 days.  If your surgeon used skin glue on the incision, you may shower in 24 hours.  The glue will flake off over the next 2-3 weeks.  Any sutures or staples will be  removed at the office during your follow-up visit. ACTIVITIES:  You may resume regular (light) daily activities beginning the next day--such as daily self-care, walking, climbing stairs--gradually increasing activities as tolerated.  You may have sexual intercourse when it is comfortable.  Refrain from any heavy lifting or straining until approved by your doctor. You may drive when you are no longer taking prescription pain medication, you can comfortably wear a seatbelt, and you can safely maneuver your car and apply brakes. RETURN TO WORK:  __________________________________________________________ Cassandra White should see your doctor in the office for a follow-up appointment approximately 2-3 weeks after your surgery.  Make sure that you call for this appointment within a day or two after you arrive home to insure a convenient appointment time. OTHER INSTRUCTIONS: __________________________________________________________________________________________________________________________ __________________________________________________________________________________________________________________________ WHEN TO CALL YOUR DOCTOR: Fever over 101.0 Inability to urinate Continued bleeding from incision. Increased pain, redness, or drainage from the incision. Increasing abdominal pain  The clinic staff is available to answer your questions during regular business hours.  Please don't hesitate to call and ask to speak to one of the nurses for clinical concerns.  If you have a medical emergency, go to the nearest emergency room or call 911.  A surgeon from [facility] is always on call at the hospital. [address], [address], [place], [place]  [address] ? [address] [address], [place], [place]   [address] [phone] ? [phone] ? FAX [phone] Web site: [email or web address]

## 2024-03-26 NOTE — Anesthesia Procedure Notes (Signed)
 Procedure Name: Intubation Date/Time: 03/26/2024 11:00 AM  Performed by: Antonetta Ronnald Caldron, CRNAPre-anesthesia Checklist: Patient identified, Emergency Drugs available, Suction available and Patient being monitored Patient Re-evaluated:Patient Re-evaluated prior to induction Oxygen Delivery Method: Circle system utilized Preoxygenation: Pre-oxygenation with 100% oxygen Induction Type: IV induction and Rapid sequence Laryngoscope Size: Glidescope (Hyperangle S4) Tube type: Oral Tube size: 7.0 mm Number of attempts: 1 Airway Equipment and Method: Rigid stylet Placement Confirmation: ETT inserted through vocal cords under direct vision, positive ETCO2 and breath sounds checked- equal and bilateral Secured at: 21 cm Tube secured with: Tape Dental Injury: Teeth and Oropharynx as per pre-operative assessment  Comments: [place] F, 1

## 2024-03-26 NOTE — Transfer of Care (Signed)
 Immediate Anesthesia Transfer of Care Note  Patient: Cassandra White  Procedure(s) Performed: LAPAROSCOPIC CHOLECYSTECTOMY (Abdomen)  Patient Location: PACU  Anesthesia Type:General  Level of Consciousness: awake, alert , and oriented  Airway & Oxygen Therapy: Patient Spontanous Breathing and Patient connected to face mask oxygen  Post-op Assessment: Report given to RN and Post -op Vital signs reviewed and stable  Post vital signs: Reviewed and stable  Last Vitals:  Vitals Value Taken Time  BP 134/102 03/26/24 12:31  Temp 36.5 C 03/26/24 12:31  Pulse 72 03/26/24 12:35  Resp 0 03/26/24 12:35  SpO2 99 % 03/26/24 12:35  Vitals shown include unfiled device data.  Last Pain:  Vitals:   03/26/24 1231  PainSc: Asleep         Complications: No notable events documented.

## 2024-03-26 NOTE — Interval H&P Note (Signed)
 History and Physical Interval Note:  03/26/2024 9:55 AM  Cassandra White  has presented today for surgery, with the diagnosis of CHRONIC CALCULUS CHOLECYSTECTOMY.  The various methods of treatment have been discussed with the patient and family. After consideration of risks, benefits and other options for treatment, the patient has consented to  Procedure(s): LAPAROSCOPIC CHOLECYSTECTOMY WITH INTRAOPERATIVE CHOLANGIOGRAM (N/A) as a surgical intervention.  The patient's history has been reviewed, patient examined, no change in status, stable for surgery.  I have reviewed the patient's chart and labs.  Questions were answered to the patient's satisfaction.     [name]

## 2024-03-27 ENCOUNTER — Encounter (HOSPITAL_COMMUNITY): Payer: Self-pay | Admitting: Surgery

## 2024-03-27 LAB — SURGICAL PATHOLOGY

## 2024-04-10 NOTE — Progress Notes (Deleted)
 Patient ID: Cassandra White, female   DOB: [birth year], 38 y.o.   MRN: [id number]  Reason for Consult: No chief complaint on file.   Referred by Oley Bascom RAMAN, NP  Subjective:     HPI  Cassandra White is a 38 y.o. female who presents for evaluation of lower extremity *** Timeframe: *** Symptoms: *** Varicosities: *** Previous wounds: *** Previous DVT: *** In compression: ***  Past Medical History:  Diagnosis Date   ADHD (attention deficit hyperactivity disorder)    Anemia    Blood transfusion without reported diagnosis 06/2021   Breast disorder    lump in right breast   Chronic hypertension during pregnancy, antepartum 06/02/2021   Depression    DVT (deep venous thrombosis) (HCC) 2019   Edema of left lower extremity    Since age 27    Fibrocystic breast changes    lump in right breast U/S 11/22/11: Multiple complicated cysts with mural nodularity as discussed above. Given the appearance, I recommend tissue sampling of the most complicated appearing lesion. If this demonstrates benign findings, recommend following the remaining lesions. Ultrasound- guided core biopsy was discussed with the patient. This will be scheduled per patient preference.  BI-RADS CATEGO   History of blood clots    right leg, left leg, right forearm   Obese    PCOS (polycystic ovarian syndrome)    Family History  Problem Relation Age of Onset   Hypertension Mother    Asthma Mother    Pulmonary embolism Mother    Deep vein thrombosis Mother    Arthritis Mother    Asthma Brother    Diabetes Father    Cancer Maternal Uncle        lung   Cancer Maternal Grandmother        brain   Cancer Maternal Grandfather        pancreatic   Diabetes Paternal Grandmother    Diabetes Paternal Grandfather    Past Surgical History:  Procedure Laterality Date   BREAST BIOPSY     x3   BUNIONECTOMY     right foot   CERVICAL CERCLAGE N/A 06/17/2021   Procedure: CERCLAGE CERVICAL;  Surgeon: Jayne Vonn DEL,  MD;  Location: [facility] LD ORS;  Service: Gynecology;  Laterality: N/A;   CHOLECYSTECTOMY N/A 03/26/2024   Procedure: LAPAROSCOPIC CHOLECYSTECTOMY;  Surgeon: Belinda Cough, MD;  Location: [facility] OR;  Service: General;  Laterality: N/A;   COLPOSCOPY  2009/2010   DILATION AND CURETTAGE OF UTERUS  06/19/2021    Short Social History:  Social History   Tobacco Use   Smoking status: Never   Smokeless tobacco: Never  Substance Use Topics   Alcohol use: Never    Comment: socially    Allergies  Allergen Reactions   Asa [Aspirin]     Stomach ulcers   Latex    Other     cucumbers   Avocado Nausea Only    Current Outpatient Medications  Medication Sig Dispense Refill   amLODipine  (NORVASC ) 10 MG tablet Take 1 tablet (10 mg total) by mouth daily. 90 tablet 3   cloNIDine HCl (KAPVAY) 0.1 MG TB12 ER tablet Take 0.1 mg by mouth at bedtime.     ferrous sulfate  325 (65 FE) MG tablet Take 325 mg by mouth 2 (two) times daily with a meal.     GuanFACINE HCl 3 MG TB24 Take 3 mg by mouth daily.     hydrochlorothiazide  (HYDRODIURIL ) 25 MG tablet Take 1 tablet (25 mg  total) by mouth daily. 90 tablet 3   hydrOXYzine (ATARAX) 25 MG tablet Take 25 mg by mouth 2 (two) times daily.     naproxen  (NAPROSYN ) 500 MG tablet Take 1 tablet (500 mg total) by mouth 2 (two) times daily. (Patient not taking: Reported on 03/19/2024) 30 tablet 0   ondansetron  (ZOFRAN -ODT) 4 MG disintegrating tablet Take 1 tablet (4 mg total) by mouth every 8 (eight) hours as needed for nausea or vomiting. (Patient not taking: Reported on 03/19/2024) 20 tablet 0   oxyCODONE  (OXY IR/ROXICODONE ) 5 MG immediate release tablet Take 5 mg by mouth 4 (four) times daily as needed for moderate pain (pain score 4-6).     oxyCODONE  (OXY IR/ROXICODONE ) 5 MG immediate release tablet Take 1 tablet (5 mg total) by mouth every 6 (six) hours as needed for severe pain (pain score 7-10). 20 tablet 0   No current facility-administered medications for this visit.     REVIEW OF SYSTEMS All other systems were reviewed and are negative    Objective:  Objective   There were no vitals filed for this visit. There is no height or weight on file to calculate BMI.  Physical Exam General: no acute distress Cardiac: hemodynamically stable Pulm: normal work of breathing Neuro: alert, no focal deficit Extremities: *** Vascular:   Right: palpable DP, PT***  Left: palpable DP, PT***   Data: Reflux study ***      Assessment/Plan:     Cassandra White is a 38 y.o. female with chronic venous insufficiency with C*** disease and reflux noted in *** I explained the foundation of CVI treatment of compression and elevation I recommended medical grade graduated compression stockings and intermittent leg elevation We also discussed that many patients find symptom improvement with exercise and if they have access to a pool should attempt water aerobics.  Plan to follow up in 3 months with Dr. Serene or Dr. Sheree with a *** reflux study in order to complete bilateral imaging      Norman GORMAN Serve MD [facility]

## 2024-04-11 ENCOUNTER — Ambulatory Visit (HOSPITAL_COMMUNITY)

## 2024-04-11 ENCOUNTER — Ambulatory Visit: Admitting: Vascular Surgery

## 2024-04-30 ENCOUNTER — Other Ambulatory Visit: Payer: Self-pay

## 2024-04-30 NOTE — Progress Notes (Deleted)
 04/30/2024 Name: JASMINNE MEALY MRN: [id number] DOB: [birth year]  No chief complaint on file.   Cassandra White is a 38 y.o. year old female who presented for a telephone visit.   They were referred to the pharmacist by their PCP for assistance in managing hypertension. PMH includes HTN, hx of DVT (2018), BMI > 50, anemia.  Subjective: Patient was  engaged by pharmacy via telephone on 11/29/23. She was instructed to start amlodipine  for elevated BP, and decrease labetelol due to side effect of HA. Patient was last seen by PCP, Bascom Borer, NP on 01/07/24. BP had improved since starting amlodipine  to ~149/90s mmHg. No medication changes were made. She previously reported that she is not planning on having any more pregnancies at this time. She was engaged by pharmacy for follow-up on 01/14/24. She reported that she self discontinued labetalol , and had ben taking amlodipine . She reported she was started on guanfacine and clonidine for ADHD, but neither medication was helping her. Her home BP continued to be elevated so she was instructed to increase amlodipine  to 10 mg daily and continue hydrochlorothiazide . At pharmacy call on 02/26/24, patient reported BP had been well controlled since increasing amlodipine . She did recently have a cholecystectomy.  If BP uncontrolled at home, add low dose ARB (keep K up?)  Today, patient reports doing well. Her BP have been well controlled since increasing amlodipine . She denies issues with side effects, besides frequent urination with hydrochlorothiazide . She reports that her psychiatrist, Dr. Laurey Kiss increased her guanfacine to 3 mg daily and added hydroxyzine to help her with sleep.    Care Team: Primary Care Provider: Borer Bascom RAMAN, NP ; Next Scheduled Visit: 07/11/24 Vascular: Norman Serve, MD, Scheduled 04/11/24   Medication Access/Adherence  Current Pharmacy:  [id number] [address], [place] - [address] AT [facility]  [address] [place] [address] Phone: [phone] Fax: [phone]   Patient reports affordability concerns with their medications: No  Patient reports access/transportation concerns to their pharmacy: No  Patient reports adherence concerns with their medications:  No     Hypertension:  Current medications: hydrochlorothiazide  25 mg daily, amlodipine  10 mg daily, clondine 0.1 mg at bedtime (prescribed by psychiatry for sleep) Medications previously tried: labetalol  (headaches)  She was recently diagnosed with ADHD - started guanfacine and clonidine (sleep). Is seeing Dr. Laurey Kiss (psychiatry)  Patient has a validated, automated, upper arm home BP cuff - checking twice daily (AM and after work). Takes medication as soon as she wakes up, then monitors ~40 min later. Current blood pressure readings readings: 120/80, 127/80, this AM 118/84 mmHg   Patient denies hypotensive s/sx including dizziness, lightheadedness.  Patient denies hypertensive symptoms including chest pain, shortness of breath. Headaches have resolved.   Objective:  BP Readings from Last 3 Encounters:  03/26/24 132/86  03/21/24 (!) 176/96  01/07/24 (!) 149/93    Lab Results  Component Value Date   HGBA1C 5.5 01/07/2024    Lab Results  Component Value Date   CREATININE 0.98 03/21/2024   BUN 11 03/21/2024   NA 139 03/21/2024   K 3.3 (L) 03/21/2024   CL 106 03/21/2024   CO2 24 03/21/2024    Lab Results  Component Value Date   LDLDIRECT 65 12/12/2011    Medications Reviewed Today   Medications were not reviewed in this encounter       Assessment/Plan:   Hypertension: - Currently controlled with home  BP consistently below goal < 130/80 mmHg. She is tolerating her current regimen well. Potassium WNL after initiation of hydrochlorothiazide . Advised patient to monitor for worsened LEE. Would consider ARB as next line therapy if needed, as pt reports that she is not  planning additional pregnancies. - Reviewed long term cardiovascular and renal outcomes of uncontrolled blood pressure - Reviewed appropriate blood pressure monitoring technique and reviewed goal blood pressure. Recommended to check home blood pressure and heart rate once daily at least 1 hour after taking medications.  - Recommend to continue hydrochlorothiazide  25 mg daily - Recommend to continue amlodipine  to 10 mg daily.    Follow Up Plan: Pharmacist telephone 04/30/24, PCP 07/11/24    Lorain Baseman, PharmD [facility] [phone]

## 2024-05-01 ENCOUNTER — Telehealth: Payer: Self-pay

## 2024-05-01 ENCOUNTER — Other Ambulatory Visit

## 2024-05-01 NOTE — Telephone Encounter (Signed)
 Attempted to contact patient for scheduled appointment for medication management. Left HIPAA compliant message for patient to return my call at their convenience.   Lorain Baseman, PharmD [facility] [phone]

## 2024-05-01 NOTE — Progress Notes (Deleted)
 05/01/2024 Name: Cassandra White MRN: [id number] DOB: 08/16/1985  No chief complaint on file.   Cassandra White is a 38 y.o. year old female who presented for a telephone visit.   They were referred to the pharmacist by their PCP for assistance in managing hypertension. PMH includes HTN, hx of DVT (2018), BMI > 50, anemia.  Subjective: Patient was  engaged by pharmacy via telephone on 11/29/23. She was instructed to start amlodipine  for elevated BP, and decrease labetelol due to side effect of HA. Patient was last seen by PCP, Bascom Borer, NP on 01/07/24. BP had improved since starting amlodipine  to ~149/90s mmHg. No medication changes were made. She previously reported that she is not planning on having any more pregnancies at this time. She was engaged by pharmacy for follow-up on 01/14/24. She reported that she self discontinued labetalol , and had ben taking amlodipine . She reported she was started on guanfacine and clonidine for ADHD, but neither medication was helping her. Her home BP continued to be elevated so she was instructed to increase amlodipine  to 10 mg daily and continue hydrochlorothiazide . At pharmacy call on 02/26/24, patient reported BP had been well controlled since increasing amlodipine . She did recently have a cholecystectomy.  If BP uncontrolled at home, add low dose ARB (keep K up?)  Today, patient reports doing well. Her BP have been well controlled since increasing amlodipine . She denies issues with side effects, besides frequent urination with hydrochlorothiazide . She reports that her psychiatrist, Dr. Laurey Kiss increased her guanfacine to 3 mg daily and added hydroxyzine to help her with sleep.    Care Team: Primary Care Provider: Borer Bascom RAMAN, NP ; Next Scheduled Visit: 07/11/24 Vascular: Norman Serve, MD, Scheduled 04/11/24   Medication Access/Adherence  Current Pharmacy:  [id number] [address], [place] - [address] AT [facility]  [address] [place] [address] Phone: [phone] Fax: [phone]   Patient reports affordability concerns with their medications: No  Patient reports access/transportation concerns to their pharmacy: No  Patient reports adherence concerns with their medications:  No     Hypertension:  Current medications: hydrochlorothiazide  25 mg daily, amlodipine  10 mg daily, clondine 0.1 mg at bedtime (prescribed by psychiatry for sleep) Medications previously tried: labetalol  (headaches)  She was recently diagnosed with ADHD - started guanfacine and clonidine (sleep). Is seeing Dr. Laurey Kiss (psychiatry)  Patient has a validated, automated, upper arm home BP cuff - checking twice daily (AM and after work). Takes medication as soon as she wakes up, then monitors ~40 min later. Current blood pressure readings readings: 120/80, 127/80, this AM 118/84 mmHg   Patient denies hypotensive s/sx including dizziness, lightheadedness.  Patient denies hypertensive symptoms including chest pain, shortness of breath. Headaches have resolved.   Objective:  BP Readings from Last 3 Encounters:  03/26/24 132/86  03/21/24 (!) 176/96  01/07/24 (!) 149/93    Lab Results  Component Value Date   HGBA1C 5.5 01/07/2024    Lab Results  Component Value Date   CREATININE 0.98 03/21/2024   BUN 11 03/21/2024   NA 139 03/21/2024   K 3.3 (L) 03/21/2024   CL 106 03/21/2024   CO2 24 03/21/2024    Lab Results  Component Value Date   LDLDIRECT 65 12/12/2011    Medications Reviewed Today   Medications were not reviewed in this encounter       Assessment/Plan:   Hypertension: - Currently controlled with home  BP consistently below goal < 130/80 mmHg. She is tolerating her current regimen well. Potassium WNL after initiation of hydrochlorothiazide . Advised patient to monitor for worsened LEE. Would consider ARB as next line therapy if needed, as pt reports that she is not  planning additional pregnancies. - Reviewed long term cardiovascular and renal outcomes of uncontrolled blood pressure - Reviewed appropriate blood pressure monitoring technique and reviewed goal blood pressure. Recommended to check home blood pressure and heart rate once daily at least 1 hour after taking medications.  - Recommend to continue hydrochlorothiazide  25 mg daily - Recommend to continue amlodipine  to 10 mg daily.    Follow Up Plan: Pharmacist telephone 04/30/24, PCP 07/11/24    Lorain Baseman, PharmD [facility] [phone]

## 2024-05-06 ENCOUNTER — Telehealth: Payer: Self-pay | Admitting: Pharmacy Technician

## 2024-05-06 ENCOUNTER — Ambulatory Visit
Admission: EM | Admit: 2024-05-06 | Discharge: 2024-05-06 | Disposition: A | Discharge status: Home / Self Care | Attending: Urgent Care | Admitting: Urgent Care

## 2024-05-06 DIAGNOSIS — Z86718 Personal history of other venous thrombosis and embolism: Secondary | ICD-10-CM

## 2024-05-06 DIAGNOSIS — R070 Pain in throat: Secondary | ICD-10-CM | POA: Diagnosis not present

## 2024-05-06 DIAGNOSIS — J309 Allergic rhinitis, unspecified: Secondary | ICD-10-CM

## 2024-05-06 DIAGNOSIS — B9789 Other viral agents as the cause of diseases classified elsewhere: Secondary | ICD-10-CM

## 2024-05-06 DIAGNOSIS — J988 Other specified respiratory disorders: Secondary | ICD-10-CM | POA: Diagnosis not present

## 2024-05-06 LAB — POC COVID19/FLU A&B COMBO
Covid Antigen, POC: NEGATIVE
Influenza A Antigen, POC: NEGATIVE
Influenza B Antigen, POC: NEGATIVE

## 2024-05-06 LAB — POCT RAPID STREP A (OFFICE): Rapid Strep A Screen: NEGATIVE

## 2024-05-06 MED ORDER — CETIRIZINE HCL 10 MG PO TABS: 10.0000 mg | ORAL_TABLET | Freq: Every day | ORAL | 0 refills | Status: AC

## 2024-05-06 MED ORDER — PROMETHAZINE-DM 6.25-15 MG/5ML PO SYRP: 5.0000 mL | ORAL_SOLUTION | Freq: Three times a day (TID) | ORAL | 0 refills | Status: DC | PRN

## 2024-05-06 MED ORDER — PREDNISONE 10 MG PO TABS: 30.0000 mg | ORAL_TABLET | Freq: Every day | ORAL | 0 refills | Status: DC

## 2024-05-06 NOTE — Discharge Instructions (Signed)
 We will manage this as a viral respiratory infection. For sore throat or cough try using a honey-based tea. Use 3 teaspoons of honey with juice squeezed from half lemon. Place shaved pieces of ginger into 1/2-1 cup of water and warm over stove top. Then mix the ingredients and repeat every 4 hours as needed. Please take Tylenol  500mg -650mg  once every 6 hours for fevers, aches and pains. Hydrate very well with at least 2 liters (64 ounces) of water. Eat light meals such as soups (chicken and noodles, chicken wild rice, vegetable).  Do not eat any foods that you are allergic to.  Start an antihistamine like Zyrtec (10mg  daily) for postnasal drainage, sinus congestion.  You can take this together with prednisone. Cough syrup as needed.

## 2024-05-06 NOTE — Progress Notes (Signed)
   05/06/2024  Patient ID: Cassandra White, female   DOB: 20-Dec-1985, 38 y.o.   MRN: [id number]  Patient engaged with clinical pharmacist for management of hypertension on 02/26/2024. Outreach by [employer] technician was requested.   Outreached patient to discuss Hypertension medication management. Left voicemail for patient to return my call at their convenience.   Adriel Kessen, CPhT [facility] Office: [phone] Email: [email or web address]

## 2024-05-06 NOTE — ED Provider Notes (Signed)
 [facility]  Note:  This document was prepared using Conservation officer, historic buildings and may include unintentional dictation errors.  MRN: [id number] DOB: 1985/11/28  Subjective:   Cassandra White is a 38 y.o. female presenting for 30 history of severe malaise, productive cough, sinus and chest congestion, throat pain, painful swallowing, body pains, fever.  No shortness of breath or wheezing.  Has a history of childhood asthma but has not impacted her recently.  She is not on anticoagulation and has not been for years.  No smoking.  She does have bad allergies.  No current facility-administered medications for this encounter.  Current Outpatient Medications:    amLODipine  (NORVASC ) 10 MG tablet, Take 1 tablet (10 mg total) by mouth daily., Disp: 90 tablet, Rfl: 3   cloNIDine HCl (KAPVAY) 0.1 MG TB12 ER tablet, Take 0.1 mg by mouth at bedtime., Disp: , Rfl:    ferrous sulfate  325 (65 FE) MG tablet, Take 325 mg by mouth 2 (two) times daily with a meal., Disp: , Rfl:    GuanFACINE HCl 3 MG TB24, Take 3 mg by mouth daily., Disp: , Rfl:    hydrochlorothiazide  (HYDRODIURIL ) 25 MG tablet, Take 1 tablet (25 mg total) by mouth daily., Disp: 90 tablet, Rfl: 3   hydrOXYzine (ATARAX) 25 MG tablet, Take 25 mg by mouth 2 (two) times daily., Disp: , Rfl:    naproxen  (NAPROSYN ) 500 MG tablet, Take 1 tablet (500 mg total) by mouth 2 (two) times daily. (Patient not taking: Reported on 03/19/2024), Disp: 30 tablet, Rfl: 0   ondansetron  (ZOFRAN -ODT) 4 MG disintegrating tablet, Take 1 tablet (4 mg total) by mouth every 8 (eight) hours as needed for nausea or vomiting. (Patient not taking: Reported on 03/19/2024), Disp: 20 tablet, Rfl: 0   oxyCODONE  (OXY IR/ROXICODONE ) 5 MG immediate release tablet, Take 5 mg by mouth 4 (four) times daily as needed for moderate pain (pain score 4-6)., Disp: , Rfl:    oxyCODONE  (OXY IR/ROXICODONE ) 5 MG immediate release tablet, Take 1 tablet (5 mg total) by  mouth every 6 (six) hours as needed for severe pain (pain score 7-10)., Disp: 20 tablet, Rfl: 0   Allergies  Allergen Reactions   Asa [Aspirin]     Stomach ulcers   Latex    Other     cucumbers   Avocado Nausea Only    Past Medical History:  Diagnosis Date   ADHD (attention deficit hyperactivity disorder)    Anemia    Blood transfusion without reported diagnosis 06/2021   Breast disorder    lump in right breast   Chronic hypertension during pregnancy, antepartum 06/02/2021   Depression    DVT (deep venous thrombosis) (HCC) 2019   Edema of left lower extremity    Since age 58    Fibrocystic breast changes    lump in right breast U/S 11/22/11: Multiple complicated cysts with mural nodularity as discussed above. Given the appearance, I recommend tissue sampling of the most complicated appearing lesion. If this demonstrates benign findings, recommend following the remaining lesions. Ultrasound- guided core biopsy was discussed with the patient. This will be scheduled per patient preference.  BI-RADS CATEGO   History of blood clots    right leg, left leg, right forearm   Obese    PCOS (polycystic ovarian syndrome)      Past Surgical History:  Procedure Laterality Date   BREAST BIOPSY     x3   BUNIONECTOMY     right  foot   CERVICAL CERCLAGE N/A 06/17/2021   Procedure: CERCLAGE CERVICAL;  Surgeon: Jayne Vonn DEL, MD;  Location: [facility] LD ORS;  Service: Gynecology;  Laterality: N/A;   CHOLECYSTECTOMY N/A 03/26/2024   Procedure: LAPAROSCOPIC CHOLECYSTECTOMY;  Surgeon: Belinda Cough, MD;  Location: [facility] OR;  Service: General;  Laterality: N/A;   COLPOSCOPY  2009/2010   DILATION AND CURETTAGE OF UTERUS  06/19/2021    Family History  Problem Relation Age of Onset   Hypertension Mother    Asthma Mother    Pulmonary embolism Mother    Deep vein thrombosis Mother    Arthritis Mother    Asthma Brother    Diabetes Father    Cancer Maternal Uncle        lung   Cancer Maternal  Grandmother        brain   Cancer Maternal Grandfather        pancreatic   Diabetes Paternal Grandmother    Diabetes Paternal Grandfather     Social History   Tobacco Use   Smoking status: Never   Smokeless tobacco: Never  Vaping Use   Vaping status: Never Used  Substance Use Topics   Alcohol use: Not Currently   Drug use: Never    ROS   Objective:   Vitals: BP (!) 142/92 (BP Location: Right Arm)   Pulse 90   Temp 98.8 F (37.1 C) (Oral)   Resp 20   LMP 04/22/2024   SpO2 95%   Physical Exam Constitutional:      General: She is not in acute distress.    Appearance: Normal appearance. She is well-developed and normal weight. She is not ill-appearing, toxic-appearing or diaphoretic.  HENT:     Head: Normocephalic and atraumatic.     Right Ear: Tympanic membrane, ear canal and external ear normal. No drainage or tenderness. No middle ear effusion. There is no impacted cerumen. Tympanic membrane is not erythematous or bulging.     Left Ear: Tympanic membrane, ear canal and external ear normal. No drainage or tenderness.  No middle ear effusion. There is no impacted cerumen. Tympanic membrane is not erythematous or bulging.     Nose: Congestion present. No rhinorrhea.     Comments: Thick streaks of postnasal drainage overlying pharynx.     Mouth/Throat:     Mouth: Mucous membranes are moist. No oral lesions.     Pharynx: No pharyngeal swelling, oropharyngeal exudate, posterior oropharyngeal erythema or uvula swelling.     Tonsils: No tonsillar exudate or tonsillar abscesses. 0 on the right. 0 on the left.  Eyes:     General: No scleral icterus.       Right eye: No discharge.        Left eye: No discharge.     Extraocular Movements: Extraocular movements intact.     Right eye: Normal extraocular motion.     Left eye: Normal extraocular motion.     Conjunctiva/sclera: Conjunctivae normal.  Cardiovascular:     Rate and Rhythm: Normal rate and regular rhythm.     Heart  sounds: Normal heart sounds. No murmur heard.    No friction rub. No gallop.  Pulmonary:     Effort: Pulmonary effort is normal. No respiratory distress.     Breath sounds: No stridor. No wheezing, rhonchi or rales.  Chest:     Chest wall: No tenderness.  Musculoskeletal:     Cervical back: Normal range of motion and neck supple.  Lymphadenopathy:     Cervical:  No cervical adenopathy.  Skin:    General: Skin is warm and dry.  Neurological:     General: No focal deficit present.     Mental Status: She is alert and oriented to person, place, and time.  Psychiatric:        Mood and Affect: Mood normal.        Behavior: Behavior normal.     Results for orders placed or performed during the hospital encounter of 05/06/24 (from the past 24 hours)  POC Covid19/Flu A&B Antigen     Status: None   Collection Time: 05/06/24  1:23 PM  Result Value Ref Range   Influenza A Antigen, POC Negative Negative   Influenza B Antigen, POC Negative Negative   Covid Antigen, POC Negative Negative  POCT rapid strep A     Status: None   Collection Time: 05/06/24  1:23 PM  Result Value Ref Range   Rapid Strep A Screen Negative Negative    Assessment and Plan :   PDMP not reviewed this encounter.  1. Viral respiratory infection   2. Throat pain   3. History of DVT (deep vein thrombosis)   4. Allergic rhinitis, unspecified seasonality, unspecified trigger    Deferred imaging given clear cardiopulmonary exam, hemodynamically stable vital signs.  Patient requested aggressive management given her difficult allergies and significant respiratory symptoms. Offered 30mg  prednisone for 5 days. Recommended supportive care otherwise. Counseled patient on potential for adverse effects with medications prescribed/recommended today, ER and return-to-clinic precautions discussed, patient verbalized understanding.    Christopher Savannah, PA-C 05/06/24 1346

## 2024-05-06 NOTE — ED Triage Notes (Signed)
 Pt c/o prod cough, head/chest congestion, sore throat, body aches, fever x 3 days-taking OTC meds-NAD-steady gait

## 2024-05-08 ENCOUNTER — Telehealth: Payer: Self-pay | Admitting: Pharmacy Technician

## 2024-05-08 NOTE — Progress Notes (Signed)
   05/08/2024  Patient ID: Cassandra White, female   DOB: 09/10/1985, 38 y.o.   MRN: [id number]  Patient engaged with clinical pharmacist for management of hypertension on 02/26/2024. Outreach by [employer] technician was requested.   Outreached patient to discuss hypertension medication management. Left voicemail for patient to return my call at their convenience.    Mamie Diiorio, CPhT [facility] Office: [phone] Email: [email or web address]

## 2024-05-12 ENCOUNTER — Telehealth: Payer: Self-pay | Admitting: Pharmacy Technician

## 2024-05-12 NOTE — Progress Notes (Signed)
   05/12/2024  Patient ID: Geni KATHEE Pouch, female   DOB: 11/13/1985, 38 y.o.   MRN: [id number]  Patient engaged with clinical pharmacist for management of hypertension on 02/26/2024. Outreach by [employer] technician was requested.   Outreached patient to discuss hypertension medication management. Left voicemail for patient to return my call at their convenience.    Crosley Stejskal, CPhT [facility] Office: [phone] Email: [email or web address]

## 2024-05-13 ENCOUNTER — Telehealth: Payer: Self-pay | Admitting: Pharmacy Technician

## 2024-05-13 NOTE — Progress Notes (Signed)
   05/13/2024 Name: Cassandra White MRN: [id number] DOB: 05/01/1986  Patient is appearing for a follow-up visit with the population health pharmacy technician. Last engaged with the clinical pharmacist to discuss hypertension on 02/26/2024. Contacted patient today to discuss hypertension.   Plan from last clinical pharmacist appointment: Hypertension: - Currently controlled with home BP consistently below goal < 130/80 mmHg. She is tolerating her current regimen well. Potassium WNL after initiation of hydrochlorothiazide . Advised patient to monitor for worsened LEE. Would consider ARB as next line therapy if needed, as pt reports that she is not planning additional pregnancies. - Reviewed long term cardiovascular and renal outcomes of uncontrolled blood pressure - Reviewed appropriate blood pressure monitoring technique and reviewed goal blood pressure. Recommended to check home blood pressure and heart rate once daily at least 1 hour after taking medications.  - Recommend to continue hydrochlorothiazide  25 mg daily - Recommend to continue amlodipine  to 10 mg daily.      Follow Up Plan: Pharmacist telephone 04/30/24, PCP 07/11/24 (copy/paste from last note)   Medication Adherence Barriers Identified:  Patient made recommended medication changes per plan: Yes Patient informs she takes Amlodipine  10g daily and hydrochlorothiazide  25mg  daily. She informs she also takes Clonidine Er 0.1mg  at bedtime to help her sleep which is prescribed by another MD. Access issues with any new medication or testing device: No Patient informs she has a good supply of her medications even though she is getting calls from [facility] informing her refills are due. Per Dr Annemarie fill history data, hydrochlorothiazide  was last filled for 90 on 6/11 and Amlodipine  10mg  on 8/4 for 90 . She informs she has been using up her 5mg  tablets to get Amlodipine  10mg  dose. The Amlodipine  5mg  was last filled for a quantity of 90 on  6/22.  Patient is checking blood pressure readings as prescribed: Yes Patient is checking blood pressure once a day Current blood pressure readings: on 05/11/24 blood pressure was 142/74, on 11/24 it was 136/87 and on 11/25 it was 176/94 but she took her medications late   Patient wanted to bring to PharmD attention that she went to Urgent Care on 05/06/2024 and was diagnosed with strep and sinus infection. She informs today she has completed the 5 day Prednisone  course but she is still having difficulty swallowing due to her throat being sore. She is inquiring if she needs to come in for an appointment, go back to Urgent Care or if the Prednisone  could be extended.  Medication Adherence Barriers Addressed/Actions Taken:  Reviewed medication changes per plan from last clinical pharmacist note Educated patient to contact pharmacy regarding new prescriptions Reviewed instructions for monitoring blood pressures at home and reminded patient to keep a written log to review with pharmacist Reminded patient of date/time of upcoming clinical pharmacist follow up and any upcoming PCP/specialists visits. Patient denies transportation barriers to the appointment. Yes  Next clinical pharmacist appointment is scheduled for: 07/08/2024  Malerie Eakins, CPhT [facility] Office: [phone] Email: [email or web address]

## 2024-05-14 ENCOUNTER — Telehealth: Payer: Self-pay | Admitting: Pharmacy Technician

## 2024-05-14 NOTE — Progress Notes (Signed)
 05/14/2024 Name: Cassandra White MRN: [id number] DOB: [birth year]  Patient is appearing for a follow-up visit with the population health pharmacy technician. Last engaged with the clinical pharmacist to discuss hypertension on 02/26/2024. Contacted patient today to discuss pharmacist response to patient's blood pressure readings sent in yesterday as well as patient's question regarding extending Prednisone  course due to patient still having throat pain..   Plan from last clinical pharmacist appointment: Hypertension: - Currently controlled with home BP consistently below goal < 130/80 mmHg. She is tolerating her current regimen well. Potassium WNL after initiation of hydrochlorothiazide . Advised patient to monitor for worsened LEE. Would consider ARB as next line therapy if needed, as pt reports that she is not planning additional pregnancies. - Reviewed long term cardiovascular and renal outcomes of uncontrolled blood pressure - Reviewed appropriate blood pressure monitoring technique and reviewed goal blood pressure. Recommended to check home blood pressure and heart rate once daily at least 1 hour after taking medications.  - Recommend to continue hydrochlorothiazide  25 mg daily - Recommend to continue amlodipine  to 10 mg daily.  Follow Up Plan: Pharmacist telephone 04/30/24, PCP 1/23/(copy/paste from last note)   Medication Adherence Barriers Identified:  Patient made recommended medication changes per plan: Yes Patient informs she takes 2 blood pressure medications. Amlodipine  and Hydrochlorothiazide . She takes these daily. She also takes Clonidine at night to help her sleep which is prescribed by another MD. Access issues with any new medication or testing device: No Patient says she has plenty of medications on hand and does not need any refills. Patient is checking blood pressure readings as prescribed: Yes She informs she checks it daily. Current blood pressure readings:  05/11/24:  142/74 05/12/24:136/87 05/13/24:176/94-She informs she took her medications late on this day. Patient wanted to bring to PharmD attention that she went to Urgent Care on 05/06/24 and was informed she had strep throat and sinus infection. At the time of this call, she informs she is still having throat pain.    CALL PATIENT TODAY TO READ THE FOLLOWING NOTE TO HER AS SENT BY THE PHARMACIST:  Hi Cassandra White, Thank you for sending your blood pressures and for speaking with Kate today. They are running a bit higher than the last time we talked. It may be related to you not feeling well recently, and prednisone  can also make your blood pressure a bit higher as it can make you hold on to fluid. Kate said you still have a sore throat and are not feeling well. I would recommend calling [facility] for an in person follow-up appointment to see if there is any additional treatment they recommend.I will call you in a couple weeks to see how your blood pressures are doing. Please avoid taking excess ibuprofen  or aleve  while you are not feeling well, as this can also increase your blood pressure. Also avoid decongestants like phenylephrine  and pseudoephedrine. Mucinex (guafenacin), Delsym (dextromethorphan), and Tylenol  (acetaminophen ) are safe to take.  -Patient informs she had no questions with the above note. She informs she already knew about the medications to avoid due to having high blood pressure. She informs she will have to go back to Urgent Care as [facility] did not have any days/times that worked with her full time work schedule and her 3 year olds school schedule. Informed patient to reach out if any additional questions or concerns.  Medication Adherence Barriers Addressed/Actions Taken:  Reviewed medication changes per plan from last clinical pharmacist note Reviewed instructions  for monitoring blood pressures at home and reminded patient to keep a written log to review with  pharmacist Reminded patient of date/time of upcoming clinical pharmacist follow up and any upcoming PCP/specialists visits. Patient denies transportation barriers to the appointment. Yes  Next clinical pharmacist appointment is scheduled for: 07/08/2024 though pharmacist may reach out earlier as indicated above.  Cassandra White, CPhT [facility] Office: [phone] Email: [email or web address]

## 2024-05-24 ENCOUNTER — Ambulatory Visit
Admission: EM | Admit: 2024-05-24 | Discharge: 2024-05-24 | Disposition: A | Discharge status: Home / Self Care | Attending: Family Medicine | Admitting: Family Medicine

## 2024-05-24 ENCOUNTER — Ambulatory Visit (INDEPENDENT_AMBULATORY_CARE_PROVIDER_SITE_OTHER)

## 2024-05-24 DIAGNOSIS — R053 Chronic cough: Secondary | ICD-10-CM

## 2024-05-24 DIAGNOSIS — B9689 Other specified bacterial agents as the cause of diseases classified elsewhere: Secondary | ICD-10-CM

## 2024-05-24 DIAGNOSIS — J101 Influenza due to other identified influenza virus with other respiratory manifestations: Secondary | ICD-10-CM | POA: Diagnosis not present

## 2024-05-24 DIAGNOSIS — R509 Fever, unspecified: Secondary | ICD-10-CM | POA: Diagnosis not present

## 2024-05-24 LAB — POC COVID19/FLU A&B COMBO
Covid Antigen, POC: NEGATIVE
Influenza A Antigen, POC: POSITIVE — AB
Influenza B Antigen, POC: NEGATIVE

## 2024-05-24 MED ORDER — AMOXICILLIN-POT CLAVULANATE 875-125 MG PO TABS: 1.0000 | ORAL_TABLET | Freq: Two times a day (BID) | ORAL | 0 refills | Status: DC

## 2024-05-24 MED ORDER — ACETAMINOPHEN 325 MG PO TABS
650.0000 mg | ORAL_TABLET | Freq: Once | ORAL | Status: AC
  Administered 2024-05-24: 650 mg via ORAL

## 2024-05-24 MED ORDER — PSEUDOEPHEDRINE HCL 30 MG PO TABS: 30.0000 mg | ORAL_TABLET | Freq: Three times a day (TID) | ORAL | 0 refills | Status: DC | PRN

## 2024-05-24 MED ORDER — OSELTAMIVIR PHOSPHATE 75 MG PO CAPS: 75.0000 mg | ORAL_CAPSULE | Freq: Two times a day (BID) | ORAL | 0 refills | Status: DC

## 2024-05-24 NOTE — Discharge Instructions (Addendum)
 You tested positive for influenza A and therefore will have you take Tamiflu .  Please also start amoxicillin -clavulanate for your bacterial upper respiratory infection since you have been sick for the past 3 weeks. Continue using Zyrtec  and add Sudafed (pseudoephedrine ). Will update you with your x-ray results tomorrow. Use cough syrup as needed.

## 2024-05-24 NOTE — ED Triage Notes (Signed)
 Pt c/o cough, head/chest congestion, chills-states she feels no better than when she was seen 11/18-NAD-steady gait

## 2024-05-24 NOTE — ED Provider Notes (Signed)
 [facility]  Note:  This document was prepared using Conservation officer, historic buildings and may include unintentional dictation errors.  MRN: [id number] DOB: 01-06-1986  Subjective:   Cassandra White is a 38 y.o. female presenting for 3-week history of persistent sinus and chest congestion, chills, or shortness of breath.  Was seen 05/06/2024 and managed with supportive care, prednisone  for a viral respiratory infection.  Strep testing, COVID and flu testing were all negative.  No smoking of any kind including cigarettes, cigars, vaping, marijuana use.  No asthma.   No current facility-administered medications for this encounter.  Current Outpatient Medications:    amLODipine  (NORVASC ) 10 MG tablet, Take 1 tablet (10 mg total) by mouth daily., Disp: 90 tablet, Rfl: 3   cetirizine  (ZYRTEC  ALLERGY) 10 MG tablet, Take 1 tablet (10 mg total) by mouth daily., Disp: 30 tablet, Rfl: 0   cloNIDine HCl (KAPVAY) 0.1 MG TB12 ER tablet, Take 0.1 mg by mouth at bedtime., Disp: , Rfl:    ferrous sulfate  325 (65 FE) MG tablet, Take 325 mg by mouth 2 (two) times daily with a meal., Disp: , Rfl:    GuanFACINE HCl 3 MG TB24, Take 3 mg by mouth daily., Disp: , Rfl:    hydrochlorothiazide  (HYDRODIURIL ) 25 MG tablet, Take 1 tablet (25 mg total) by mouth daily., Disp: 90 tablet, Rfl: 3   hydrOXYzine (ATARAX) 25 MG tablet, Take 25 mg by mouth 2 (two) times daily., Disp: , Rfl:    naproxen  (NAPROSYN ) 500 MG tablet, Take 1 tablet (500 mg total) by mouth 2 (two) times daily. (Patient not taking: Reported on 03/19/2024), Disp: 30 tablet, Rfl: 0   ondansetron  (ZOFRAN -ODT) 4 MG disintegrating tablet, Take 1 tablet (4 mg total) by mouth every 8 (eight) hours as needed for nausea or vomiting. (Patient not taking: Reported on 03/19/2024), Disp: 20 tablet, Rfl: 0   oxyCODONE  (OXY IR/ROXICODONE ) 5 MG immediate release tablet, Take 5 mg by mouth 4 (four) times daily as needed for moderate pain (pain score  4-6)., Disp: , Rfl:    oxyCODONE  (OXY IR/ROXICODONE ) 5 MG immediate release tablet, Take 1 tablet (5 mg total) by mouth every 6 (six) hours as needed for severe pain (pain score 7-10)., Disp: 20 tablet, Rfl: 0   predniSONE  (DELTASONE ) 10 MG tablet, Take 3 tablets (30 mg total) by mouth daily with breakfast., Disp: 15 tablet, Rfl: 0   promethazine -dextromethorphan (PROMETHAZINE -DM) 6.25-15 MG/5ML syrup, Take 5 mLs by mouth 3 (three) times daily as needed for cough., Disp: 200 mL, Rfl: 0   Allergies  Allergen Reactions   Asa [Aspirin]     Stomach ulcers   Latex    Other     cucumbers   Avocado Nausea Only    Past Medical History:  Diagnosis Date   ADHD (attention deficit hyperactivity disorder)    Anemia    Blood transfusion without reported diagnosis 06/2021   Breast disorder    lump in right breast   Chronic hypertension during pregnancy, antepartum 06/02/2021   Depression    DVT (deep venous thrombosis) (HCC) 2019   Edema of left lower extremity    Since age 65    Fibrocystic breast changes    lump in right breast U/S 11/22/11: Multiple complicated cysts with mural nodularity as discussed above. Given the appearance, I recommend tissue sampling of the most complicated appearing lesion. If this demonstrates benign findings, recommend following the remaining lesions. Ultrasound- guided core biopsy was discussed with the  patient. This will be scheduled per patient preference.  BI-RADS CATEGO   History of blood clots    right leg, left leg, right forearm   Obese    PCOS (polycystic ovarian syndrome)      Past Surgical History:  Procedure Laterality Date   BREAST BIOPSY     x3   BUNIONECTOMY     right foot   CERVICAL CERCLAGE N/A 06/17/2021   Procedure: CERCLAGE CERVICAL;  Surgeon: Jayne Vonn DEL, MD;  Location: [facility] LD ORS;  Service: Gynecology;  Laterality: N/A;   CHOLECYSTECTOMY N/A 03/26/2024   Procedure: LAPAROSCOPIC CHOLECYSTECTOMY;  Surgeon: Belinda Cough, MD;  Location: [facility]  OR;  Service: General;  Laterality: N/A;   COLPOSCOPY  2009/2010   DILATION AND CURETTAGE OF UTERUS  06/19/2021    Family History  Problem Relation Age of Onset   Hypertension Mother    Asthma Mother    Pulmonary embolism Mother    Deep vein thrombosis Mother    Arthritis Mother    Asthma Brother    Diabetes Father    Cancer Maternal Uncle        lung   Cancer Maternal Grandmother        brain   Cancer Maternal Grandfather        pancreatic   Diabetes Paternal Grandmother    Diabetes Paternal Grandfather     Social History   Tobacco Use   Smoking status: Never   Smokeless tobacco: Never  Vaping Use   Vaping status: Never Used  Substance Use Topics   Alcohol use: Not Currently   Drug use: Never    ROS   Objective:   Vitals: BP (!) 156/101 (BP Location: Right Arm)   Pulse (!) 108   Temp (!) 101.1 F (38.4 C) (Oral)   Resp 20   LMP 05/19/2024   SpO2 98%   Physical Exam Constitutional:      General: She is not in acute distress.    Appearance: Normal appearance. She is well-developed and normal weight. She is not ill-appearing, toxic-appearing or diaphoretic.  HENT:     Head: Normocephalic and atraumatic.     Right Ear: Tympanic membrane, ear canal and external ear normal. No drainage or tenderness. No middle ear effusion. There is no impacted cerumen. Tympanic membrane is not erythematous or bulging.     Left Ear: Tympanic membrane, ear canal and external ear normal. No drainage or tenderness.  No middle ear effusion. There is no impacted cerumen. Tympanic membrane is not erythematous or bulging.     Nose: Nose normal. No congestion or rhinorrhea.     Mouth/Throat:     Mouth: Mucous membranes are moist. No oral lesions.     Pharynx: No pharyngeal swelling, oropharyngeal exudate, posterior oropharyngeal erythema or uvula swelling.     Tonsils: No tonsillar exudate or tonsillar abscesses.  Eyes:     General: No scleral icterus.       Right eye: No  discharge.        Left eye: No discharge.     Extraocular Movements: Extraocular movements intact.     Right eye: Normal extraocular motion.     Left eye: Normal extraocular motion.     Conjunctiva/sclera: Conjunctivae normal.  Cardiovascular:     Rate and Rhythm: Normal rate and regular rhythm.     Heart sounds: Normal heart sounds. No murmur heard.    No friction rub. No gallop.  Pulmonary:     Effort: Pulmonary effort is  normal. No respiratory distress.     Breath sounds: No stridor. No wheezing, rhonchi or rales.  Chest:     Chest wall: No tenderness.  Musculoskeletal:     Cervical back: Normal range of motion and neck supple.  Lymphadenopathy:     Cervical: No cervical adenopathy.  Skin:    General: Skin is warm and dry.  Neurological:     General: No focal deficit present.     Mental Status: She is alert and oriented to person, place, and time.  Psychiatric:        Mood and Affect: Mood normal.        Behavior: Behavior normal.    Positive influenza A test.  Assessment and Plan :   PDMP not reviewed this encounter.  1. Influenza A   2. Bacterial upper respiratory infection   3. Persistent cough   4. Fever, unspecified    Tamiflu  for influenza A.  Augmentin  for bacterial upper respiratory infection.  Continue with supportive care.  X-ray overread pending, our clinic is closing and therefore we will follow-up with patient tomorrow.  Counseled patient on potential for adverse effects with medications prescribed/recommended today, ER and return-to-clinic precautions discussed, patient verbalized understanding.    Christopher Savannah, [place] 05/24/24 1546

## 2024-05-25 ENCOUNTER — Ambulatory Visit: Payer: Self-pay | Admitting: Urgent Care

## 2024-05-26 ENCOUNTER — Encounter: Payer: Self-pay | Admitting: Nurse Practitioner

## 2024-05-26 NOTE — Telephone Encounter (Signed)
 Provider given temp

## 2024-05-26 NOTE — Progress Notes (Signed)
 This encounter was created in error - please disregard.

## 2024-06-30 ENCOUNTER — Other Ambulatory Visit (HOSPITAL_COMMUNITY): Payer: Self-pay | Admitting: General Surgery

## 2024-07-07 ENCOUNTER — Encounter: Payer: Self-pay | Admitting: Dietician

## 2024-07-07 ENCOUNTER — Encounter: Attending: General Surgery | Admitting: Dietician

## 2024-07-07 VITALS — Ht 69.0 in | Wt 385.2 lb

## 2024-07-07 DIAGNOSIS — E669 Obesity, unspecified: Secondary | ICD-10-CM | POA: Diagnosis present

## 2024-07-07 DIAGNOSIS — K8010 Calculus of gallbladder with chronic cholecystitis without obstruction: Secondary | ICD-10-CM | POA: Diagnosis present

## 2024-07-07 NOTE — Progress Notes (Signed)
 Nutrition Assessment for Bariatric Surgery: Pre-Surgery Behavioral and Nutrition Intervention Program   Medical Nutrition Therapy  Appt Start Time: 0808    End Time: 0907  Patient was seen on 07/07/2024 for Pre-Operative Nutrition Assessment. Purpose of todays visit  enhance perioperative outcomes along with a healthy weight maintenance   Referral stated Supervised Weight Loss (SWL) visits needed: 3 months  Planned surgery: Gastric By-Pass Pt expectation of surgery: to be about 180; want to be comfortable  NUTRITION ASSESSMENT   Anthropometrics  Start weight at [facility]: 385.2 lbs (date: 07/07/2024)  Height: 69 in BMI: 56.88 kg/m2     Clinical   Pharmacotherapy: History of weight loss medication used: years ago phentermine, stopped due to panic attacks, stating she had some success and then no more weight loss.  Medical hx: obesity, HTN Medications: see EMR (iron drops) Labs: (planning to get labs for the pathway) potassium 3.3, glucose 107 Notable signs/symptoms: none noted Any previous deficiencies? 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 Pt states she has been plus size all her life, stating she would like to be 180 lbs. Pt states she just wants to be comfortable. Pt states she is planning to get labs for the surgical pathway. Pt states she will meal prep her lunch for work for the week. Pt states her aunt and cousin had bariatric surgery. Pt states she takes a multivitamin daily and iron drops.  Current Physical Activity Recommendations state 150 minutes per week of moderate to vigorous movement including Cardio and 1-2 days  of resistance activities as well as flexibility/balance activities:  Pts current physical activity: ADLs (walking at work), with 0% recommendation reached   Sleep Hygiene: duration and quality: okay; taking clonidine and hydroxyzine for sleep  Current Patient Perceived Stress Level as stated by pt on a scale of 1-10:  6       Stress Management Techniques: squeeze stress ball; listen to music  According to the Dietary Guidelines for Americans Recommendation: equivalent 1.5-2 cups fruits per day, equivalent 2-3 cups vegetables per day and at least half all grains whole  Fruit servings per day (on average): 1-2, meeting 66-100% recommendation  Non-starchy vegetable servings per day (on average): 1, meeting 33-50% recommendation  Whole Grains per day (on average): 0  Number of meals missed/skipped per week out of 21: 10  24-Hr Dietary Recall First Meal: skip or eggs, bacon or bagel or avocado toast or protein coffee Snack: granola bar or fruit and cheese or starbucks trays with egg (boiled), almond butter, apple slices Second Meal: left overs from dinner the night before Snack:  Third Meal: BBQ chicken, rice, broccoli Snack:  Beverages: coffee (with protein in the morning), water, soda, pre-biotic soda, seltzer water  Alcoholic beverages per week: 0   Estimated Energy Needs Calories: 1500  NUTRITION DIAGNOSIS  Overweight/obesity ([place]-3.3) related to past poor dietary habits and physical inactivity as evidenced by patient w/ planned gastric by-pass surgery following dietary guidelines for continued weight loss.  NUTRITION INTERVENTION  Nutrition counseling (C-1) and education (E-2) to facilitate bariatric surgery goals.  Educated pt on micronutrient deficiencies post-surgery and behavioral/dietary strategies to start in order to mitigate that risk  Behavioral and Dietary Interventions Pre-Op Goals Reviewed with the Patient Nutrition: Healthy Eating Behaviors Switch to non-caloric,  non-carbonated and non-caffeinated beverages such as  water, unsweetened tea, Crystal Light and zero calorie beverages (aim for 64 oz. per day) Cut out grazing between meals or at night  Find a protein shake you like Eat every 3-5 hours        Eliminate distractions while eating (TV, computer, reading, driving, texting) Take 79-69 minutes to eat a meal  Decrease high sugar foods/decrease high fat/fried foods Eliminate alcoholic beverages Increase protein intake (eggs, fish, chicken, yogurt) before surgery Eat non starchy vegetables 2 times a day 7 days a week Eat complex carbohydrates such as whole grains and fruits   Behavioral Modification: Physical Activity Increase my usual daily activity (use stairs, park farther, etc.) Engage in going to the gym activity 60 minutes 3 times per week  Other:    *Goals that are bolded indicate the pt would like to start working towards these  Handouts Provided Include  Bariatric Surgery handouts (Nutrition Visits, Pre Surgery Behavioral Change Goals, Protein Shakes Brands to Choose From, Vitamins & Mineral Supplementation)  Learning Style & Readiness for Change Teaching method utilized: Visual, Auditory, and hands on  Demonstrated degree of understanding via: Teach Back  Readiness Level: preparation Barriers to learning/adherence to lifestyle change: nothing identified  RD's Notes for Next Visit Patient progress towards chosen goals.   MONITORING & EVALUATION Dietary intake, weekly physical activity, body weight, and preoperative behavioral change goals   Next Steps  Patient is to follow up at [facility] in 2-4 weeks for first SWL visit.

## 2024-07-08 ENCOUNTER — Other Ambulatory Visit: Payer: Self-pay

## 2024-07-08 VITALS — BP 127/74

## 2024-07-08 DIAGNOSIS — I10 Essential (primary) hypertension: Secondary | ICD-10-CM

## 2024-07-08 NOTE — Progress Notes (Signed)
 "  07/08/2024 Name: Cassandra White MRN: [id number] DOB: [birth year]  Chief Complaint  Patient presents with   Hypertension    Cassandra White is a 39 y.o. year old female who presented for a telephone visit.   They were referred to the pharmacist by their PCP for assistance in managing hypertension. PMH includes HTN, hx of DVT (2018), BMI > 50, anemia.  Subjective: Patient was last seen by PCP, Bascom Borer, NP on 01/07/24. At last pharmacy call on 02/26/24, patient reported home BP at goal on hydrochlorothiazide  25 mg daily and amlodipine  10 mg daily.   Today, patient repotrs doing well. States BP medications are still working well for her. Home BP have been< 130/80 mmHg. Denies AE with medications, including worsening swelling or LEE. She states she is undergoing workup for bariatric surgery.    Care Team: Primary Care Provider: Borer Bascom RAMAN, NP ; Next Scheduled Visit: 07/11/24 - patient reports she will need to reschedule Vascular: Norman Serve, MD, Scheduled 04/11/24   Medication Access/Adherence  Current Pharmacy:  [id number] [address] [place], [place] - [address] AT [facility] [address] [place] [address] Phone: [phone] Fax: [phone]   Patient reports affordability concerns with their medications: No  Patient reports access/transportation concerns to their pharmacy: No  Patient reports adherence concerns with their medications:  No     Hypertension:  Current medications: hydrochlorothiazide  25 mg daily, amlodipine  10 mg daily, clondine 0.1 mg at bedtime (prescribed by psychiatry for sleep) Medications previously tried: labetalol  (headaches)  She was recently diagnosed with ADHD - prescribed guanfacine and clonidine (sleep). Is seeing Dr. Laurey Kiss (psychiatry)  Patient has a validated, automated, upper arm home BP cuff - checking twice daily (AM and after work). Takes medication as soon as she wakes up, then  monitors ~40 min later. Current blood pressure readings readings: This AM 131/80, yesterday 127/74 mmHg Highest recently was at a doctors appt - 148/92 mmHg, bt she reports she was feeling nervous.  Patient denies hypotensive s/sx including dizziness, lightheadedness.  Patient denies hypertensive symptoms including chest pain, shortness of breath, or headaches.   Objective:  BP Readings from Last 3 Encounters:  07/08/24 127/74  05/24/24 (!) 156/101  05/06/24 (!) 142/92    Lab Results  Component Value Date   HGBA1C 5.5 01/07/2024    Lab Results  Component Value Date   CREATININE 0.98 03/21/2024   BUN 11 03/21/2024   NA 139 03/21/2024   K 3.3 (L) 03/21/2024   CL 106 03/21/2024   CO2 24 03/21/2024    Lab Results  Component Value Date   LDLDIRECT 65 12/12/2011    Medications Reviewed Today     Reviewed by Brinda Lorain SQUIBB, RPH-CPP (Pharmacist) on 07/08/24 at [phone]  Med List Status: <None>   Medication Order Taking? Sig Documenting Provider Last Dose Status Informant  amLODipine  (NORVASC ) 10 MG tablet [id number] Yes Take 1 tablet (10 mg total) by mouth daily. Borer Bascom RAMAN, NP  Active Self    Discontinued 07/08/24 [id number] (Completed Course)   cetirizine  (ZYRTEC  ALLERGY) 10 MG tablet [id number]  Take 1 tablet (10 mg total) by mouth daily. Christopher Savannah, PA-C  Active   cloNIDine HCl (KAPVAY) 0.1 MG TB12 ER tablet [id number] Yes Take 0.1 mg by mouth at bedtime. [provider]  Active Self  ferrous sulfate  325 (65 FE) MG tablet [id number] Yes Take 325 mg by mouth 2 (two) times daily with  a meal. [provider]  Active Self           Med Note SOILA, LYLE BROCKS   Wed Mar 19, 2024  9:14 AM)    GuanFACINE HCl 3 MG TB24 [id number] Yes Take 3 mg by mouth daily. [provider]  Active Self  hydrochlorothiazide  (HYDRODIURIL ) 25 MG tablet [id number] Yes Take 1 tablet (25 mg total) by mouth daily. Oley Bascom RAMAN, NP  Active Self  hydrOXYzine (ATARAX) 25 MG tablet  [id number]  Take 25 mg by mouth 2 (two) times daily. [provider]  Active Self   Patient not taking:   Discontinued 07/08/24 0916 (Expired Prescription)  Patient not taking:   Discontinued 07/08/24 0917 (Expired Prescription)   Discontinued 07/08/24 0917 (Completed Course)     Discontinued 07/08/24 0916 (Completed Course)   Discontinued 07/08/24 0916 (Completed Course)     Discontinued 07/08/24 0916 (Completed Course)     Discontinued 07/08/24 0916 (Completed Course)     Discontinued 07/08/24 0916 (Patient Preference)               Assessment/Plan:   Hypertension: - Currently controlled with home BP consistently below goal < 130/80 mmHg.  Would consider ARB as next line therapy if needed in the future, as pt reports that she is not planning additional pregnancies. If she does have bariatric surgery this year, will ned to monitor for changes in blood pressure related to weight loss.  - Reviewed long term cardiovascular and renal outcomes of uncontrolled blood pressure - Reviewed appropriate blood pressure monitoring technique and reviewed goal blood pressure. Recommended to check home blood pressure and heart rate once daily at least 1 hour after taking medications.  - Recommend to continue hydrochlorothiazide  25 mg daily - Recommend to continue amlodipine  to 10 mg daily.    Appears that patient's dispense hx is incorrect. Her BP is controlled and it appears she is taking her medications as prescribed.    Follow Up Plan: Pharmacist telephone 09/09/24, PCP 08/15/24   Lorain Baseman, PharmD [facility] [phone]   "

## 2024-07-11 ENCOUNTER — Ambulatory Visit: Payer: Self-pay | Admitting: Nurse Practitioner

## 2024-07-14 ENCOUNTER — Ambulatory Visit (HOSPITAL_COMMUNITY): Payer: Self-pay | Admitting: Licensed Clinical Social Worker

## 2024-07-14 DIAGNOSIS — Z8659 Personal history of other mental and behavioral disorders: Secondary | ICD-10-CM

## 2024-07-14 DIAGNOSIS — F4320 Adjustment disorder, unspecified: Secondary | ICD-10-CM
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
## 2024-07-22 ENCOUNTER — Ambulatory Visit (HOSPITAL_COMMUNITY)
Admission: RE | Admit: 2024-07-22 | Discharge: 2024-07-22 | Disposition: A | Source: Ambulatory Visit | Attending: General Surgery | Admitting: General Surgery

## 2024-07-22 ENCOUNTER — Encounter: Payer: Self-pay | Admitting: Dietician

## 2024-07-22 ENCOUNTER — Encounter: Admitting: Dietician

## 2024-07-22 DIAGNOSIS — E669 Obesity, unspecified: Secondary | ICD-10-CM
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
## 2024-07-30 ENCOUNTER — Inpatient Hospital Stay

## 2024-07-30 ENCOUNTER — Inpatient Hospital Stay: Admitting: Hematology and Oncology

## 2024-08-15 ENCOUNTER — Ambulatory Visit: Admitting: Nurse Practitioner

## 2024-08-18 ENCOUNTER — Encounter: Admitting: Dietician

## 2024-09-09 ENCOUNTER — Other Ambulatory Visit: Payer: Self-pay

## 2024-09-18 ENCOUNTER — Encounter: Admitting: Dietician
